# Patient Record
Sex: Female | Born: 1950
Health system: Southern US, Community
[De-identification: ages and names within clinical notes are randomized; demographics above are authoritative.]

## PROBLEM LIST (undated history)

## (undated) DIAGNOSIS — K219 Gastro-esophageal reflux disease without esophagitis: Secondary | ICD-10-CM

## (undated) DIAGNOSIS — E669 Obesity, unspecified: Secondary | ICD-10-CM

## (undated) DIAGNOSIS — E039 Hypothyroidism, unspecified: Secondary | ICD-10-CM

## (undated) DIAGNOSIS — T8859XA Other complications of anesthesia, initial encounter: Secondary | ICD-10-CM

## (undated) DIAGNOSIS — R42 Dizziness and giddiness: Secondary | ICD-10-CM

## (undated) DIAGNOSIS — M1990 Unspecified osteoarthritis, unspecified site: Secondary | ICD-10-CM

## (undated) DIAGNOSIS — I10 Essential (primary) hypertension: Secondary | ICD-10-CM

## (undated) DIAGNOSIS — I200 Unstable angina: Secondary | ICD-10-CM

## (undated) DIAGNOSIS — E66812 Obesity, class 2: Secondary | ICD-10-CM

## (undated) DIAGNOSIS — N1831 Chronic kidney disease, stage 3a: Secondary | ICD-10-CM

## (undated) DIAGNOSIS — I739 Peripheral vascular disease, unspecified: Secondary | ICD-10-CM

## (undated) DIAGNOSIS — T4145XA Adverse effect of unspecified anesthetic, initial encounter: Secondary | ICD-10-CM

## (undated) DIAGNOSIS — I2510 Atherosclerotic heart disease of native coronary artery without angina pectoris: Secondary | ICD-10-CM

## (undated) DIAGNOSIS — I011 Acute rheumatic endocarditis: Secondary | ICD-10-CM

## (undated) DIAGNOSIS — D649 Anemia, unspecified: Secondary | ICD-10-CM

## (undated) DIAGNOSIS — E785 Hyperlipidemia, unspecified: Secondary | ICD-10-CM

## (undated) DIAGNOSIS — M797 Fibromyalgia: Secondary | ICD-10-CM

## (undated) DIAGNOSIS — I341 Nonrheumatic mitral (valve) prolapse: Secondary | ICD-10-CM

## (undated) HISTORY — PX: ABDOMINAL HYSTERECTOMY: SHX81

## (undated) HISTORY — DX: Obesity, class 2: E66.812

## (undated) HISTORY — DX: Unstable angina: I20.0

## (undated) HISTORY — PX: WRIST ARTHROSCOPY: SHX838

## (undated) HISTORY — PX: WRIST ARTHROSCOPY: SUR100

## (undated) HISTORY — DX: Peripheral vascular disease, unspecified: I73.9

## (undated) HISTORY — PX: CHOLECYSTECTOMY: SHX55

## (undated) HISTORY — PX: JOINT REPLACEMENT: SHX530

## (undated) HISTORY — DX: Atherosclerotic heart disease of native coronary artery without angina pectoris: I25.10

## (undated) HISTORY — DX: Obesity, unspecified: E66.9

## (undated) HISTORY — PX: FOOT SURGERY: SHX648

## (undated) HISTORY — DX: Dizziness and giddiness: R42

## (undated) HISTORY — DX: Hyperlipidemia, unspecified: E78.5

## (undated) HISTORY — PX: COLONOSCOPY: SHX174

## (undated) HISTORY — DX: Chronic kidney disease, stage 3a: N18.31

## (undated) HISTORY — DX: Anemia, unspecified: D64.9

---

## 1998-03-13 ENCOUNTER — Other Ambulatory Visit: Admission: RE | Admit: 1998-03-13 | Discharge: 1998-03-13 | Payer: Self-pay | Admitting: Obstetrics & Gynecology

## 1999-04-02 ENCOUNTER — Other Ambulatory Visit: Admission: RE | Admit: 1999-04-02 | Discharge: 1999-04-02 | Payer: Self-pay | Admitting: Obstetrics and Gynecology

## 1999-04-03 ENCOUNTER — Other Ambulatory Visit: Admission: RE | Admit: 1999-04-03 | Discharge: 1999-04-03 | Payer: Self-pay | Admitting: Obstetrics and Gynecology

## 1999-04-03 ENCOUNTER — Encounter (INDEPENDENT_AMBULATORY_CARE_PROVIDER_SITE_OTHER): Payer: Self-pay

## 2000-01-15 ENCOUNTER — Ambulatory Visit (HOSPITAL_COMMUNITY): Admission: RE | Admit: 2000-01-15 | Discharge: 2000-01-15 | Payer: Self-pay | Admitting: *Deleted

## 2000-01-15 ENCOUNTER — Encounter (INDEPENDENT_AMBULATORY_CARE_PROVIDER_SITE_OTHER): Payer: Self-pay

## 2000-04-28 ENCOUNTER — Other Ambulatory Visit: Admission: RE | Admit: 2000-04-28 | Discharge: 2000-04-28 | Payer: Self-pay | Admitting: Obstetrics and Gynecology

## 2001-05-07 ENCOUNTER — Other Ambulatory Visit: Admission: RE | Admit: 2001-05-07 | Discharge: 2001-05-07 | Payer: Self-pay | Admitting: Obstetrics and Gynecology

## 2001-05-13 ENCOUNTER — Encounter: Admission: RE | Admit: 2001-05-13 | Discharge: 2001-05-13 | Payer: Self-pay | Admitting: Obstetrics and Gynecology

## 2001-05-13 ENCOUNTER — Encounter: Payer: Self-pay | Admitting: Obstetrics and Gynecology

## 2001-09-03 ENCOUNTER — Encounter: Payer: Self-pay | Admitting: General Surgery

## 2001-09-08 ENCOUNTER — Encounter: Payer: Self-pay | Admitting: General Surgery

## 2001-09-08 ENCOUNTER — Observation Stay (HOSPITAL_COMMUNITY): Admission: RE | Admit: 2001-09-08 | Discharge: 2001-09-09 | Payer: Self-pay | Admitting: General Surgery

## 2001-09-08 ENCOUNTER — Encounter (INDEPENDENT_AMBULATORY_CARE_PROVIDER_SITE_OTHER): Payer: Self-pay

## 2002-04-02 ENCOUNTER — Encounter: Payer: Self-pay | Admitting: Orthopedic Surgery

## 2002-04-02 ENCOUNTER — Ambulatory Visit (HOSPITAL_COMMUNITY): Admission: RE | Admit: 2002-04-02 | Discharge: 2002-04-02 | Payer: Self-pay | Admitting: Orthopedic Surgery

## 2002-06-07 ENCOUNTER — Other Ambulatory Visit: Admission: RE | Admit: 2002-06-07 | Discharge: 2002-06-07 | Payer: Self-pay | Admitting: Obstetrics and Gynecology

## 2002-07-02 ENCOUNTER — Emergency Department (HOSPITAL_COMMUNITY): Admission: EM | Admit: 2002-07-02 | Discharge: 2002-07-02 | Payer: Self-pay | Admitting: Emergency Medicine

## 2002-07-02 ENCOUNTER — Encounter: Payer: Self-pay | Admitting: Emergency Medicine

## 2003-07-07 ENCOUNTER — Other Ambulatory Visit: Admission: RE | Admit: 2003-07-07 | Discharge: 2003-07-07 | Payer: Self-pay | Admitting: Obstetrics and Gynecology

## 2004-01-27 ENCOUNTER — Ambulatory Visit (HOSPITAL_COMMUNITY): Admission: RE | Admit: 2004-01-27 | Discharge: 2004-01-27 | Payer: Self-pay | Admitting: *Deleted

## 2004-04-18 ENCOUNTER — Ambulatory Visit (HOSPITAL_COMMUNITY): Admission: RE | Admit: 2004-04-18 | Discharge: 2004-04-18 | Payer: Self-pay | Admitting: *Deleted

## 2004-12-03 ENCOUNTER — Other Ambulatory Visit: Admission: RE | Admit: 2004-12-03 | Discharge: 2004-12-03 | Payer: Self-pay | Admitting: Obstetrics and Gynecology

## 2006-03-13 ENCOUNTER — Ambulatory Visit: Payer: Self-pay | Admitting: Critical Care Medicine

## 2006-04-18 ENCOUNTER — Encounter: Admission: RE | Admit: 2006-04-18 | Discharge: 2006-04-18 | Payer: Self-pay | Admitting: Orthopedic Surgery

## 2006-05-13 ENCOUNTER — Encounter: Admission: RE | Admit: 2006-05-13 | Discharge: 2006-05-13 | Payer: Self-pay | Admitting: Orthopedic Surgery

## 2007-08-15 ENCOUNTER — Encounter: Admission: RE | Admit: 2007-08-15 | Discharge: 2007-08-15 | Payer: Self-pay | Admitting: Orthopedic Surgery

## 2008-11-14 ENCOUNTER — Encounter: Admission: RE | Admit: 2008-11-14 | Discharge: 2008-11-14 | Payer: Self-pay | Admitting: Orthopedic Surgery

## 2009-08-01 HISTORY — PX: KNEE ARTHROSCOPY: SHX127

## 2009-08-25 ENCOUNTER — Ambulatory Visit (HOSPITAL_BASED_OUTPATIENT_CLINIC_OR_DEPARTMENT_OTHER): Admission: RE | Admit: 2009-08-25 | Discharge: 2009-08-25 | Payer: Self-pay | Admitting: Orthopedic Surgery

## 2010-06-11 ENCOUNTER — Encounter
Admission: RE | Admit: 2010-06-11 | Discharge: 2010-06-11 | Payer: Self-pay | Source: Home / Self Care | Attending: Orthopedic Surgery | Admitting: Orthopedic Surgery

## 2010-07-22 ENCOUNTER — Encounter: Payer: Self-pay | Admitting: Orthopedic Surgery

## 2010-08-30 ENCOUNTER — Other Ambulatory Visit: Payer: Self-pay | Admitting: Orthopedic Surgery

## 2010-08-30 DIAGNOSIS — M545 Low back pain, unspecified: Secondary | ICD-10-CM

## 2010-09-02 ENCOUNTER — Ambulatory Visit
Admission: RE | Admit: 2010-09-02 | Discharge: 2010-09-02 | Disposition: A | Discharge status: Home / Self Care | Payer: BC Managed Care – HMO | Source: Ambulatory Visit | Attending: Orthopedic Surgery | Admitting: Orthopedic Surgery

## 2010-09-02 DIAGNOSIS — M545 Low back pain, unspecified: Secondary | ICD-10-CM

## 2010-09-19 LAB — BASIC METABOLIC PANEL
BUN: 9 mg/dL (ref 6–23)
CO2: 30 mEq/L (ref 19–32)
Calcium: 9.1 mg/dL (ref 8.4–10.5)
Chloride: 100 mEq/L (ref 96–112)
Creatinine, Ser: 0.76 mg/dL (ref 0.4–1.2)
GFR calc Af Amer: >60 mL/min (ref >60)
GFR calc non Af Amer: >60 mL/min (ref >60)
Glucose, Bld: 107 mg/dL — ABNORMAL HIGH (ref 70–99)
Potassium: 3.3 mEq/L — ABNORMAL LOW (ref 3.5–5.1)
Sodium: 138 mEq/L (ref 135–145)

## 2010-09-19 LAB — POCT HEMOGLOBIN-HEMACUE: Hemoglobin: 12.5 g/dL (ref 12.0–15.0)

## 2010-11-16 NOTE — Procedures (Signed)
[facility]  Patient:    Kari [name], Kari [name]                        MRN: [id number] Adm. Date:  [id number] Attending:  Sabino Gasser CC:         Miguel Aschoff, M.D.                           Procedure Report  PROCEDURE:  Upper endoscopy.  ENDOSCOPIST:  Sabino Gasser, M.D.  INDICATIONS:  Reflux symptomatology, chest pain.  ANESTHESIA:  Demerol 70 mg and Versed 8 mg were given intravenously in divided dose.  DESCRIPTION OF PROCEDURE:  With patient mildly sedated in the left lateral decubitus position, the Olympus videoscopic endoscope was inserted in the mouth and passed under direct vision through the esophagus, which appeared normal, into the stomach; fundus, body and antrum all appeared normal. Duodenal bulb and second portion of duodenum were well-visualized and they also appeared normal.  Photographs were taken of these areas.  From this point, the endoscope was slowly withdrawn, taking circumferential views of the entire duodenal mucosa, till the endoscope had been pulled back into the stomach and placed on retroflexion to view the stomach from below and this too appeared normal and was photographed.  The endoscope was straightened and pulled back from distal-to-proximal stomach, taking circumferential views of the entire gastric mucosa, which otherwise appeared normal, as did the esophageal mucosa.  The endoscope was withdrawn.  Patients vital signs and pulse oximetry remained stable.  Patient tolerated the procedure well without apparent complications.  FINDINGS:  Essentially negative endoscopic examination.  PLAN:  Proceed to colonoscopy. DD:  01/15/00 TD:  01/16/00 Job: [id number]

## 2010-11-16 NOTE — Op Note (Signed)
[facility]  Patient:    KUULEI, KLEIER Visit Number: [id number] MRN: [id number]          Service Type: DSU Location: DAY Attending Physician:  Carson Myrtle Dictated by:   Sheppard Plumber Earlene Plater, M.D. Proc. Date: 09/08/01 Admit Date:  09/08/2001   CC:         Chales Salmon. Abigail Miyamoto, M.D.  Sabino Gasser, M.D.   Operative Report  PREOPERATIVE DIAGNOSES:  Cholecystitis and biliary dyskinesia.  POSTOPERATIVE DIAGNOSES:  Cholecystectomy and biliary dyskinesia.  PROCEDURE:  Laparoscopic cholecystectomy and operative cholangiogram.  SURGEON:  Timothy E. Earlene Plater, M.D.  ASSISTANT:  Vikki Ports, M.D.  ANESTHESIA:  CRNA supervised Dr. Rica Mast.  INDICATIONS FOR PROCEDURE:  Ms. Tuohy had been seen and evaluated in the office. She has biliary dyskinesia with decreased ejection fraction of gallbladder function, no evident stones and a history compatible with cholecystitis. After careful discussion, evaluation, and options explained, she wishes to proceed with laparoscopic cholecystectomy. She has a number of medical conditions and allergies which have been evaluated and avoided.  DESCRIPTION OF PROCEDURE:  The patient was taken to the operating room, placed supine, general endotracheal anesthesia administered and was scrubbed prepped and draped in the usual fashion. An infraumbilical incision was made in the fascia and peritoneum entered without complication. The Hasson catheter placed, the abdomen insufflated. Peritoneoscopy revealed a distended bladder but no other abnormalities. A second 10 mm trocar placed in the mid epigastrium and two 5 mm trocars in the right upper quadrant. The gallbladder did appear thickened; however, there were no stones or adhesions. The gallbladder was grasped, placed on tension, carefully dissected and there was a small anterior artery. A more prominent posterior artery and a normal appearing cystic duct. These  structures were separately dissected out, the anterior artery triply clipped and divided, cystic duct clipped near the gallbladder, an incision made in the cystic duct and cholangiography carried out with real-time fluoroscopy. The dye flowed quickly and smoothly through the common bile duct into the duodenum and then filled the biliary tree. It appeared normal. The catheter was removed and the remnant of the cystic duct triply clipped. It was fully divided, the posterior artery quadruply clipped an divided and then the gallbladder dissected from the gallbladder bed with the placement of a couple of extra clips in the gallbladder bed. There was no bleeding, no bile spillage and no complications. The irrigant was clear. The gallbladder was removed through the infraumbilical incision was tied closed with a #1 Vicryl and was passed off the field. Again inspection and irrigation carried out and was clear. There were no apparent problems. Trocars removed under direct vision. The abdomen released of CO2. Counts correct. The skin incisions closed with subcuticular 3-0 monocryl. Steri-Strips applied. Final count is correct. She was removed to the recovery room in good condition. Dictated by:   Sheppard Plumber Earlene Plater, M.D. Attending Physician:  Carson Myrtle DD:  09/08/01 TD:  09/08/01 Job: [id number]

## 2010-11-16 NOTE — Procedures (Signed)
[facility]  Patient:    Kari [name], Kari [name]                        MRN: [id number] Adm. Date:  [id number] Attending:  Sabino Gasser CC:         Miguel Aschoff, M.D.                           Procedure Report  PROCEDURE:  Colonoscopy.  ENDOSCOPIST:  Sabino Gasser, M.D.  INDICATIONS:  Hemoccult positivity.  Family history of colon polyps and colon cancer.  ANESTHESIA:  Demerol 20 mg and Versed 2 mg were given intravenously in divided dose.  DESCRIPTION OF PROCEDURE:  With patient mildly sedated in the left lateral decubitus position, the Olympus videoscopic colonoscope was inserted in the rectum and passed through a tortuous sigmoid colon to the cecum.  Cecum was identified by ileocecal valve and appendiceal orifice, both of which were photographed; however, all photographs were lost.  We also then entered into the terminal ileum via the ileocecal valve and this too appeared normal.  From this point, the colonoscope was slowly withdrawn, taking circumferential views of the entire colonic mucosa, stopping to take random biopsies along the way of mucosa that looked somewhat villous, almost like small intestinal mucosa. This was done till we pulled back all the way to the rectum, which appeared normal on direct view and showed large internal hemorrhoids on retroflexed view.  The endoscope was straightened and withdrawn.  Patients vital signs and pulse oximetry remained stable.  Patient tolerated the procedure well without apparent complications.  FINDINGS:  Large internal hemorrhoids.  Villous-appearing mucosa biopsy, await biopsy report.  Otherwise unremarkable colonoscopic examination.  PLAN:  Have patient follow up with me for the results of the biopsy and as needed. DD:  01/15/00 TD:  01/16/00 Job: [id number]

## 2010-11-20 ENCOUNTER — Other Ambulatory Visit: Payer: Self-pay | Admitting: Orthopedic Surgery

## 2010-11-20 DIAGNOSIS — M25552 Pain in left hip: Secondary | ICD-10-CM

## 2010-11-21 ENCOUNTER — Ambulatory Visit
Admission: RE | Admit: 2010-11-21 | Discharge: 2010-11-21 | Disposition: A | Discharge status: Home / Self Care | Payer: BC Managed Care – HMO | Source: Ambulatory Visit | Attending: Orthopedic Surgery | Admitting: Orthopedic Surgery

## 2010-11-21 DIAGNOSIS — M25552 Pain in left hip: Secondary | ICD-10-CM

## 2011-02-26 ENCOUNTER — Other Ambulatory Visit: Payer: Self-pay | Admitting: Family Medicine

## 2011-02-26 DIAGNOSIS — G44009 Cluster headache syndrome, unspecified, not intractable: Secondary | ICD-10-CM

## 2011-02-26 DIAGNOSIS — T1490XA Injury, unspecified, initial encounter: Secondary | ICD-10-CM

## 2011-02-27 ENCOUNTER — Ambulatory Visit
Admission: RE | Admit: 2011-02-27 | Discharge: 2011-02-27 | Disposition: A | Discharge status: Home / Self Care | Payer: BC Managed Care – HMO | Source: Ambulatory Visit | Attending: Family Medicine | Admitting: Family Medicine

## 2011-02-27 DIAGNOSIS — G44009 Cluster headache syndrome, unspecified, not intractable: Secondary | ICD-10-CM

## 2011-02-27 DIAGNOSIS — T1490XA Injury, unspecified, initial encounter: Secondary | ICD-10-CM

## 2011-05-28 ENCOUNTER — Other Ambulatory Visit: Payer: Self-pay | Admitting: Dermatology

## 2011-09-10 ENCOUNTER — Other Ambulatory Visit: Payer: Self-pay | Admitting: Orthopedic Surgery

## 2011-09-12 ENCOUNTER — Encounter (HOSPITAL_BASED_OUTPATIENT_CLINIC_OR_DEPARTMENT_OTHER): Payer: Self-pay | Admitting: *Deleted

## 2011-09-12 ENCOUNTER — Other Ambulatory Visit: Payer: Self-pay

## 2011-09-12 ENCOUNTER — Encounter (HOSPITAL_BASED_OUTPATIENT_CLINIC_OR_DEPARTMENT_OTHER)
Admission: RE | Admit: 2011-09-12 | Discharge: 2011-09-12 | Disposition: A | Discharge status: Home / Self Care | Payer: BC Managed Care – HMO | Source: Ambulatory Visit | Attending: Orthopedic Surgery | Admitting: Orthopedic Surgery

## 2011-09-12 LAB — COMPREHENSIVE METABOLIC PANEL
ALT: 27 U/L (ref 0–35)
AST: 23 U/L (ref 0–37)
Albumin: 4 g/dL (ref 3.5–5.2)
Alkaline Phosphatase: 82 U/L (ref 39–117)
BUN: 13 mg/dL (ref 6–23)
CO2: 26 mEq/L (ref 19–32)
Calcium: 9.7 mg/dL (ref 8.4–10.5)
Chloride: 103 mEq/L (ref 96–112)
Creatinine, Ser: 0.65 mg/dL (ref 0.50–1.10)
GFR calc Af Amer: >90 mL/min (ref >90)
GFR calc non Af Amer: >90 mL/min (ref >90)
Glucose, Bld: 91 mg/dL (ref 70–99)
Potassium: 3.6 mEq/L (ref 3.5–5.1)
Sodium: 139 mEq/L (ref 135–145)
Total Bilirubin: 0.8 mg/dL (ref 0.3–1.2)
Total Protein: 6.9 g/dL (ref 6.0–8.3)

## 2011-09-12 NOTE — Progress Notes (Signed)
cmet done-has to watch liver studies due to methotrexate ekg Bring meds dos

## 2011-09-17 ENCOUNTER — Encounter (HOSPITAL_BASED_OUTPATIENT_CLINIC_OR_DEPARTMENT_OTHER)
Admission: RE | Disposition: A | Discharge status: Home / Self Care | Payer: Self-pay | Source: Ambulatory Visit | Attending: Orthopedic Surgery

## 2011-09-17 ENCOUNTER — Ambulatory Visit (HOSPITAL_BASED_OUTPATIENT_CLINIC_OR_DEPARTMENT_OTHER)
Admission: RE | Admit: 2011-09-17 | Discharge: 2011-09-17 | Disposition: A | Discharge status: Home / Self Care | Payer: BC Managed Care – HMO | Source: Ambulatory Visit | Attending: Orthopedic Surgery | Admitting: Orthopedic Surgery

## 2011-09-17 ENCOUNTER — Encounter (HOSPITAL_BASED_OUTPATIENT_CLINIC_OR_DEPARTMENT_OTHER): Payer: Self-pay | Admitting: Anesthesiology

## 2011-09-17 ENCOUNTER — Encounter (HOSPITAL_BASED_OUTPATIENT_CLINIC_OR_DEPARTMENT_OTHER): Payer: Self-pay | Admitting: *Deleted

## 2011-09-17 ENCOUNTER — Ambulatory Visit (HOSPITAL_BASED_OUTPATIENT_CLINIC_OR_DEPARTMENT_OTHER): Payer: BC Managed Care – HMO | Admitting: Anesthesiology

## 2011-09-17 DIAGNOSIS — IMO0001 Reserved for inherently not codable concepts without codable children: Secondary | ICD-10-CM | POA: Insufficient documentation

## 2011-09-17 DIAGNOSIS — M069 Rheumatoid arthritis, unspecified: Secondary | ICD-10-CM | POA: Insufficient documentation

## 2011-09-17 DIAGNOSIS — Z01810 Encounter for preprocedural cardiovascular examination: Secondary | ICD-10-CM | POA: Insufficient documentation

## 2011-09-17 DIAGNOSIS — I059 Rheumatic mitral valve disease, unspecified: Secondary | ICD-10-CM | POA: Insufficient documentation

## 2011-09-17 DIAGNOSIS — G5620 Lesion of ulnar nerve, unspecified upper limb: Secondary | ICD-10-CM | POA: Insufficient documentation

## 2011-09-17 DIAGNOSIS — I10 Essential (primary) hypertension: Secondary | ICD-10-CM | POA: Insufficient documentation

## 2011-09-17 DIAGNOSIS — E039 Hypothyroidism, unspecified: Secondary | ICD-10-CM | POA: Insufficient documentation

## 2011-09-17 DIAGNOSIS — K219 Gastro-esophageal reflux disease without esophagitis: Secondary | ICD-10-CM | POA: Insufficient documentation

## 2011-09-17 HISTORY — DX: Gastro-esophageal reflux disease without esophagitis: K21.9

## 2011-09-17 HISTORY — DX: Hypothyroidism, unspecified: E03.9

## 2011-09-17 HISTORY — DX: Acute rheumatic endocarditis: I01.1

## 2011-09-17 HISTORY — DX: Essential (primary) hypertension: I10

## 2011-09-17 HISTORY — PX: ULNAR NERVE TRANSPOSITION: SHX2595

## 2011-09-17 HISTORY — DX: Adverse effect of unspecified anesthetic, initial encounter: T41.45XA

## 2011-09-17 HISTORY — DX: Fibromyalgia: M79.7

## 2011-09-17 HISTORY — DX: Other complications of anesthesia, initial encounter: T88.59XA

## 2011-09-17 HISTORY — DX: Unspecified osteoarthritis, unspecified site: M19.90

## 2011-09-17 LAB — POCT HEMOGLOBIN-HEMACUE: Hemoglobin: 11.9 g/dL — ABNORMAL LOW (ref 12.0–15.0)

## 2011-09-17 SURGERY — ULNAR NERVE DECOMPRESSION/TRANSPOSITION
Anesthesia: General | Site: Elbow | Laterality: Left | Wound class: Clean

## 2011-09-17 MED ORDER — PROMETHAZINE HCL 25 MG/ML IJ SOLN: 6.2500 mg | INTRAMUSCULAR | Status: DC | PRN

## 2011-09-17 MED ORDER — HYDROMORPHONE HCL PF 1 MG/ML IJ SOLN
0.2500 mg | INTRAMUSCULAR | Status: DC | PRN
  Administered 2011-09-17 (×2): 0.5 mg via INTRAVENOUS

## 2011-09-17 MED ORDER — DEXAMETHASONE SODIUM PHOSPHATE 10 MG/ML IJ SOLN
INTRAMUSCULAR | Status: DC | PRN
  Administered 2011-09-17: 10 mg via INTRAVENOUS

## 2011-09-17 MED ORDER — LIDOCAINE HCL 2 % IJ SOLN
INTRAMUSCULAR | Status: DC | PRN
  Administered 2011-09-17: 3 mL

## 2011-09-17 MED ORDER — ONDANSETRON HCL 4 MG/2ML IJ SOLN
INTRAMUSCULAR | Status: DC | PRN
  Administered 2011-09-17: 4 mg via INTRAVENOUS

## 2011-09-17 MED ORDER — HYDROCODONE-ACETAMINOPHEN 5-325 MG PO TABS
1.0000 | ORAL_TABLET | Freq: Once | ORAL | Status: AC
  Administered 2011-09-17: 1 via ORAL

## 2011-09-17 MED ORDER — LACTATED RINGERS IV SOLN
INTRAVENOUS | Status: DC
  Administered 2011-09-17 (×2): via INTRAVENOUS

## 2011-09-17 MED ORDER — MIDAZOLAM HCL 5 MG/5ML IJ SOLN
INTRAMUSCULAR | Status: DC | PRN
  Administered 2011-09-17: 2 mg via INTRAVENOUS

## 2011-09-17 MED ORDER — PROPOFOL 10 MG/ML IV EMUL
INTRAVENOUS | Status: DC | PRN
  Administered 2011-09-17: 150 mg via INTRAVENOUS

## 2011-09-17 MED ORDER — FENTANYL CITRATE 0.05 MG/ML IJ SOLN
INTRAMUSCULAR | Status: DC | PRN
  Administered 2011-09-17 (×2): 25 ug via INTRAVENOUS
  Administered 2011-09-17: 100 ug via INTRAVENOUS

## 2011-09-17 MED ORDER — HYDROCODONE-ACETAMINOPHEN 5-325 MG PO TABS: ORAL_TABLET | ORAL | Status: AC

## 2011-09-17 SURGICAL SUPPLY — 43 items
BANDAGE ADHESIVE 1X3 (GAUZE/BANDAGES/DRESSINGS) IMPLANT
BANDAGE ELASTIC 4 VELCRO ST LF (GAUZE/BANDAGES/DRESSINGS) ×1 IMPLANT
BLADE MINI RND TIP GREEN BEAV (BLADE) ×1 IMPLANT
BLADE SURG 15 STRL LF DISP TIS (BLADE) ×1 IMPLANT
BLADE SURG 15 STRL SS (BLADE) ×2
BNDG CMPR 9X4 STRL LF SNTH (GAUZE/BANDAGES/DRESSINGS) ×1
BNDG ESMARK 4X9 LF (GAUZE/BANDAGES/DRESSINGS) ×1 IMPLANT
BRUSH SCRUB EZ PLAIN DRY (MISCELLANEOUS) ×2 IMPLANT
CLOTH BEACON ORANGE TIMEOUT ST (SAFETY) ×2 IMPLANT
CORDS BIPOLAR (ELECTRODE) ×2 IMPLANT
COVER MAYO STAND STRL (DRAPES) ×2 IMPLANT
COVER TABLE BACK 60X90 (DRAPES) ×2 IMPLANT
CUFF TOURNIQUET SINGLE 18IN (TOURNIQUET CUFF) ×1 IMPLANT
DECANTER SPIKE VIAL GLASS SM (MISCELLANEOUS) ×1 IMPLANT
DRAPE EXTREMITY T 121X128X90 (DRAPE) ×2 IMPLANT
DRAPE SURG 17X23 STRL (DRAPES) ×2 IMPLANT
DRSG TEGADERM 4X4.75 (GAUZE/BANDAGES/DRESSINGS) ×2 IMPLANT
GAUZE SPONGE 4X4 12PLY STRL LF (GAUZE/BANDAGES/DRESSINGS) ×1 IMPLANT
GLOVE BIO SURGEON STRL SZ 6.5 (GLOVE) ×2 IMPLANT
GLOVE BIOGEL M STRL SZ7.5 (GLOVE) ×2 IMPLANT
GLOVE EXAM NITRILE PF MED BLUE (GLOVE) ×1 IMPLANT
GLOVE ORTHO TXT STRL SZ7.5 (GLOVE) ×2 IMPLANT
GOWN BRE IMP PREV XXLGXLNG (GOWN DISPOSABLE) ×2 IMPLANT
GOWN PREVENTION PLUS XLARGE (GOWN DISPOSABLE) ×2 IMPLANT
LOOP VESSEL MAXI BLUE (MISCELLANEOUS) IMPLANT
NEEDLE 27GAX1X1/2 (NEEDLE) ×1 IMPLANT
PACK BASIN DAY SURGERY FS (CUSTOM PROCEDURE TRAY) ×2 IMPLANT
PADDING CAST ABS 4INX4YD NS (CAST SUPPLIES)
PADDING CAST ABS COTTON 4X4 ST (CAST SUPPLIES) ×1 IMPLANT
SLEEVE SCD COMPRESS KNEE MED (MISCELLANEOUS) ×1 IMPLANT
SPONGE GAUZE 4X4 12PLY (GAUZE/BANDAGES/DRESSINGS) ×1 IMPLANT
STOCKINETTE 4X48 STRL (DRAPES) ×2 IMPLANT
STRIP CLOSURE SKIN 1/2X4 (GAUZE/BANDAGES/DRESSINGS) ×2 IMPLANT
SUT PROLENE 3 0 PS 2 (SUTURE) ×2 IMPLANT
SUT VIC AB 3-0 X1 27 (SUTURE) ×1 IMPLANT
SUT VIC AB 4-0 P-3 18XBRD (SUTURE) IMPLANT
SUT VIC AB 4-0 P3 18 (SUTURE)
SYR 3ML 23GX1 SAFETY (SYRINGE) IMPLANT
SYR BULB 3OZ (MISCELLANEOUS) ×1 IMPLANT
SYR CONTROL 10ML LL (SYRINGE) ×1 IMPLANT
TOWEL OR 17X24 6PK STRL BLUE (TOWEL DISPOSABLE) ×4 IMPLANT
UNDERPAD 30X30 INCONTINENT (UNDERPADS AND DIAPERS) ×2 IMPLANT
WATER STERILE IRR 1000ML POUR (IV SOLUTION) ×2 IMPLANT

## 2011-09-17 NOTE — Progress Notes (Signed)
PT admitted to PACU Phase 1  At 0849 am.  Assessment completed at 0849 on arrival, error listed incorrectly on PACU flowsheet, please note assessment completed at 0849, not 0845.

## 2011-09-17 NOTE — Anesthesia Preprocedure Evaluation (Signed)
Anesthesia Evaluation  Patient identified by MRN, date of birth, ID band Patient awake    Reviewed: Allergy & Precautions, H&P , NPO status , reviewed documented beta blocker date and time   Airway Mallampati: II TM Distance: >3 FB Neck ROM: Full    Dental No notable dental hx. (+) Teeth Intact and Dental Advisory Given   Pulmonary neg pulmonary ROS,  breath sounds clear to auscultation  Pulmonary exam normal       Cardiovascular hypertension, Pt. on medications and Pt. on home beta blockers + Valvular Problems/Murmurs (? h/o MVP- not sx) MVP Rhythm:Regular Rate:Normal     Neuro/Psych negative neurological ROS     GI/Hepatic Neg liver ROS, GERD-  Medicated and Controlled,  Endo/Other  Hypothyroidism (on replacement) Morbid obesity  Renal/GU negative Renal ROS     Musculoskeletal  (+) Arthritis - (not on steroids presently), Rheumatoid disorders,  Fibromyalgia -  Abdominal (+) + obese,   Peds  Hematology   Anesthesia Other Findings   Reproductive/Obstetrics                           Anesthesia Physical Anesthesia Plan  ASA: III  Anesthesia Plan: General   Post-op Pain Management:    Induction: Intravenous  Airway Management Planned: LMA  Additional Equipment:   Intra-op Plan:   Post-operative Plan:   Informed Consent: I have reviewed the patients History and Physical, chart, labs and discussed the procedure including the risks, benefits and alternatives for the proposed anesthesia with the patient or authorized representative who has indicated his/her understanding and acceptance.   Dental advisory given  Plan Discussed with: Surgeon and CRNA  Anesthesia Plan Comments: (Plan routine monitors, GA- LMA OK)        Anesthesia Quick Evaluation

## 2011-09-17 NOTE — H&P (Signed)
Kari [name] is an 61 y.o. female.   Chief Complaint: Complaining of chronic and progressive numbness and tingling ulnar distribution left hand HPI: . She is left hand dominant.  She has significant pain in the region of her left elbow at the medial epicondyle. She had a series of steroid injections initially in the summer of 2012 and a 2nd in January 2013 without relief. With persistent numbness in her small finger, she was sent for electrodiagnostic studies. These were completed at [facility]. This revealed significant slowing of the left ulnar nerve across the cubital tunnel.   Past Medical History  Diagnosis Date  . Hypertension   . Arthritis   . Rheumatoid aortitis   . Osteoporosis   . GERD (gastroesophageal reflux disease)   . Hypothyroidism   . Fibromyalgia   . Complication of anesthesia     when block attempted ax -turned red in preop-surg delayed    Past Surgical History  Procedure Date  . Knee arthroscopy 2/11    right  . Wrist arthroplasty     rt x2-fusion  . Wrist arthroscopy     lt  . Cholecystectomy   . Abdominal hysterectomy   . Cesarean section   . Colonoscopy     History reviewed. No pertinent family history. Social History:  reports that she has never smoked. She does not have any smokeless tobacco history on file. She reports that she does not drink alcohol or use illicit drugs.  Allergies:  Allergies  Allergen Reactions  . Clinoril (Sulindac) Nausea And Vomiting  . Erythromycin Nausea And Vomiting  . Penicillins Hives  . Sulfa Antibiotics Nausea And Vomiting  . Trimethoprim Nausea And Vomiting  . Vancomycin Cross Reactors     Flushing-redmans reaction    Medications Prior to Admission  Medication Dose Route Frequency Provider Last Rate Last Dose  . lactated ringers infusion   Intravenous Continuous Hart Robinsons, MD 20 mL/hr at 09/17/11 4098     Medications Prior to Admission  Medication Sig Dispense Refill  . atenolol  (TENORMIN) 50 MG tablet Take 50 mg by mouth daily.      . cholecalciferol (VITAMIN D) 1000 UNITS tablet Take 1,000 Units by mouth daily.      . folic acid (FOLVITE) 800 MCG tablet Take 800 mcg by mouth daily.      Marland Kitchen gabapentin (NEURONTIN) 300 MG capsule Take 300 mg by mouth 2 (two) times daily before lunch and supper. Takes 2 am,3 pm      . hydrochlorothiazide (HYDRODIURIL) 25 MG tablet Take 25 mg by mouth daily.      Marland Kitchen levothyroxine (SYNTHROID, LEVOTHROID) 50 MCG tablet Take 50 mcg by mouth daily.      . methotrexate (RHEUMATREX) 2.5 MG tablet Take 7.5 mg by mouth once a week. Takes 8-2.5mg       . pantoprazole (PROTONIX) 40 MG tablet Take 40 mg by mouth daily.      . predniSONE (DELTASONE) 10 MG tablet Take 10 mg by mouth as needed.      . traMADol (ULTRAM) 50 MG tablet Take 50 mg by mouth every 6 (six) hours as needed.      . simvastatin (ZOCOR) 20 MG tablet Take 20 mg by mouth every evening.        Results for orders placed during the hospital encounter of 09/17/11 (from the past 48 hour(s))  POCT HEMOGLOBIN-HEMACUE     Status: Abnormal   Collection Time   09/17/11  6:55 AM  Component Value Range Comment   Hemoglobin 11.9 (*) 12.0 - 15.0 (g/dL)     No results found.   Pertinent items are noted in HPI.  Blood pressure 145/57, pulse 55, temperature 97.4 F (36.3 C), temperature source Oral, resp. rate 20, height 5\' 3"  (1.6 m), weight 80.74 kg (178 lb), SpO2 98.00%.  General appearance: alert Head: Normocephalic, without obvious abnormality Neck: supple, symmetrical, trachea midline Resp: clear to auscultation bilaterally Cardio: regular rate and rhythm, S1, S2 normal, no murmur, click, rub or gallop GI: normal findings: bowel sounds normal Extremities:no sign of intrinsic atrophy right or left. She has a positive elbow hyperflexion test on the left, positive Tinel's sign over the cubital tunnel. She has a stable ulnar nerve. Her pulses and capillary refill are intact. Her pinch  and grip strength are preserved  Electrodiagnostic studies from Dr. Maurice Small are reviewed. There is evidence of a demyelinating compressive mononeuropathy left ulnar nerve at the cubital tunnel.  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 The procedure risks, benefits, and postoperative course were discussed with the patient at length and she was in agreement with this plan.  DASNOIT,Jamarion [place] J 09/17/2011, 7:18 AM    H&P documentation: 09/17/2011  -History and Physical Reviewed  -Patient has been re-examined  -No change in the plan of care  Wyn Forster, MD

## 2011-09-17 NOTE — Anesthesia Procedure Notes (Signed)
Procedure Name: LMA Insertion Date/Time: 09/17/2011 8:03 AM Performed by: Burna Cash Pre-anesthesia Checklist: Patient identified, Emergency Drugs available, Suction available and Patient being monitored Patient Re-evaluated:Patient Re-evaluated prior to inductionOxygen Delivery Method: Circle System Utilized Preoxygenation: Pre-oxygenation with 100% oxygen Intubation Type: IV induction Ventilation: Mask ventilation without difficulty LMA: LMA inserted LMA Size: 4.0 Number of attempts: 1 Airway Equipment and Method: bite block Placement Confirmation: positive ETCO2 Tube secured with: Tape Dental Injury: Teeth and Oropharynx as per pre-operative assessment

## 2011-09-17 NOTE — Op Note (Signed)
OP NOTE DICTATED 09/17/11 [id number]

## 2011-09-17 NOTE — Op Note (Signed)
NAME:  Kari [name], Kari [name]                      ACCOUNT NO.:  MEDICAL RECORD NO.:  [id number]  LOCATION:                                 FACILITY:  PHYSICIAN:  Katy Fitch. Grainne Knights, M.D.      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  OPERATION:  In situ decompression of left ulnar nerve at cubital tunnel.  SURGEON:  Josephine Igo, MD  ASSISTANT:  Marveen Reeks Dasnoit, PAC  ANESTHESIA:  General by LMA.  SUPERVISING ANESTHESIOLOGIST:  Germaine Pomfret, MD  INDICATIONS:  Kari [name] is a 62 year old woman with a long-standing rheumatoid arthritis.  We have been involved in her care off and on for the past 20 years.  Recently, she was referred through the courtesy of Dr. Lunette Stands for evaluation and management of chronic left arm numbness in the ring and small fingers and weakness of pinch and grasp.  Dr. Charlett Blake sent her for detailed electrodiagnostic studies at [facility] by the physical medicine specialist who documented significant left ulnar neuropathy in the cubital tunnel and normal right ulnar nerve function.  We provided detailed anesthesia informed consent to Kari [name] recommending proceeding with in situ and/or transposition of the ulnar nerve depending on our findings of surgery and whether or not the nerve remained stable after decompression.  Preoperatively, questions were invited and answered in detail.  PROCEDURE:  Kari [name] was brought to room 2 of the [facility] and placed in supine position on the operating table.  Following routine anesthesia informed consent by Dr. Jean Rosenthal, general anesthesia by LMA technique was recommended and accepted.  In room 2 under Dr. Edison Pace direct supervision, general  anesthesia was induced, followed by routine Betadine scrub and paint of the left upper extremity.  Kari [name]' multiple drug allergies were noted.  We elected to proceed without prophylactic antibiotics.  Following routine Betadine scrub and paint, sterile stockinette and impervious arthroscopy drapes were applied, followed by exsanguination of the left arm with Esmarch bandage, inflation of arterial tourniquet on the proximal brachium to 220 mmHg.  Procedure commenced with a routine surgical time-out, followed by a 3-cm incision directly over the path of the ulnar nerve posterior to the medial epicondyle.  Subcutaneous tissues were carefully divided taking care to identify and gently spare the posterior branch of the medial antebrachial cutaneous nerve.  The nerve was unroofed posterior to the epicondyle preserving most of the arcuate ligament anteriorly.  The nerve was decompressed 6 cm proximally and at least 8 cm distally by release of the fascia of the flexor carpi ulnaris.  Dissection of multiple fibrous bands and transverse vessels that were compressing the nerve deep to the heads of the flexor carpi ulnaris.  After complete decompression, hemostasis was achieved with bipolar cautery and the saline, followed by examination of the elbow both with the tourniquet on and off.  The elbow was ranged 0- 140 degrees elbow flexion.  The nerve remained  stable behind the medial epicondyle and the remnant of the arcuate ligament.  There was a bulging medial head of the triceps that was removed with electrocautery removing approximately 1 cm wide strip of the medial head of the triceps over distance of 2 cm.  This appeared to decrease the roll of the nerve from the cubital groove with full elbow flexion.  The wound was then irrigated, followed by closure with subcutaneous 3-0 Vicryl and intradermal 3-0 Prolene with Steri-Strips.  Sterile dressing was applied with gauze and Tegaderm,  followed by Ace wrap.  For aftercare, Kari [name] was provided with a prescription for hydrocodone 5/325 one p.o. q.4-6 hours p.r.n. pain, 24 tablets without refill.  We will see her back for followup in our office in 8 days for dressing change and a range of motion program.  We have encouraged her to begin immediate range of motion exercises beginning this afternoon.     Katy Fitch Makya Yurko, M.D.     RVS/MEDQ  D:  09/17/2011  T:  09/17/2011  Job:  [id number]  cc:   Lunette Stands, M.D.

## 2011-09-17 NOTE — Transfer of Care (Signed)
Immediate Anesthesia Transfer of Care Note  Patient: Kari [name]  Procedure(s) Performed: Procedure(s) (LRB): ULNAR NERVE DECOMPRESSION/TRANSPOSITION (Left)  Patient Location: PACU  Anesthesia Type: General  Level of Consciousness: awake  Airway & Oxygen Therapy: Patient Spontanous Breathing and Patient connected to face mask oxygen  Post-op Assessment: Report given to PACU RN and Post -op Vital signs reviewed and stable  Post vital signs: Reviewed and stable  Complications: No apparent anesthesia complications

## 2011-09-17 NOTE — Brief Op Note (Signed)
09/17/2011  8:44 AM  PATIENT:  Kari [name]  62 y.o. female  PRE-OPERATIVE DIAGNOSIS:  left ulnar nerve neuropathy cubital tunnel  POST-OPERATIVE DIAGNOSIS:  left ulnar nerve neuropathy cubital tunnel  PROCEDURE:  Procedure(s) (LRB): ULNAR NERVE DECOMPRESSION LEFT ELBOW  SURGEON: Wyn Forster., MD   PHYSICIAN ASSISTANT:   ASSISTANTS: Mallory Shirk.A-C   ANESTHESIA:   general  EBL:  Total I/O In: 1000 [I.V.:1000] Out: -   BLOOD ADMINISTERED:none  DRAINS: none   LOCAL MEDICATIONS USED:  LIDOCAINE 3CC 2%  SPECIMEN:  No Specimen  DISPOSITION OF SPECIMEN:  N/A  COUNTS:  YES  TOURNIQUET:   Total Tourniquet Time Documented: Upper Arm (Left) - 19 minutes  DICTATION: .Other Dictation: Dictation Number [id number]  PLAN OF CARE: Discharge to home after PACU  PATIENT DISPOSITION:  PACU - hemodynamically stable.

## 2011-09-17 NOTE — Anesthesia Postprocedure Evaluation (Signed)
  Anesthesia Post-op Note  Patient: Kari [name]  Procedure(s) Performed: Procedure(s) (LRB): ULNAR NERVE DECOMPRESSION/TRANSPOSITION (Left)  Patient Location: PACU  Anesthesia Type: General  Level of Consciousness: awake, alert  and oriented  Airway and Oxygen Therapy: Patient Spontanous Breathing  Post-op Pain: none  Post-op Assessment: Post-op Vital signs reviewed, Patient's Cardiovascular Status Stable, Respiratory Function Stable, Patent Airway, No signs of Nausea or vomiting and Pain level controlled  Post-op Vital Signs: Reviewed and stable  Complications: No apparent anesthesia complications

## 2011-09-17 NOTE — Discharge Instructions (Signed)
[facility] Center Instructions °Hand Surgery ° °Wound Care: °Keep your hand elevated above the level of your heart.  Do not allow it to dangle  by your side.  Keep the dressing dry and do not remove it unless your doctor advises you to do so.  He will usually change it at the time of your post-op visit.  Moving your fingers is advised to stimulate circulation but will depend on the site of your surgery.  If you have a splint applied, your doctor will advise you regarding movement. ° °Activity: °Do not drive or operate machinery today.  Rest today and then you may return to your normal activity and work as indicated by your physician. ° °Diet:  °Drink liquids today or eat a light diet.  You may resume a regular diet tomorrow.   ° °General expectations: °Pain for two to three days. °Fingers may become slightly swollen. ° °Call your doctor if any of the following occur: °Severe pain not relieved by pain medication. °Elevated temperature. °Dressing soaked with blood. °Inability to move fingers. °White or bluish color to fingers.[facility]  °[address] °[place], [place] [address] °[phone] ° ° ° ° ° ° °Post Anesthesia Home Care Instructions ° °Activity: °Get plenty of rest for the remainder of the day. A responsible adult should stay with you for 24 hours following the procedure.  °For the next 24 hours, DO NOT: °-Drive a car °-Operate machinery °-Drink alcoholic beverages °-Take any medication unless instructed by your physician °-Make any legal decisions or sign important papers. ° °Meals: °Start with liquid foods such as gelatin or soup. Progress to regular foods as tolerated. Avoid greasy, spicy, heavy foods. If nausea and/or vomiting occur, drink only clear liquids until the nausea and/or vomiting subsides. Call your physician if vomiting continues. ° °Special Instructions/Symptoms: °Your throat may feel dry or sore from the anesthesia or the breathing tube placed in your throat during  surgery. If this causes discomfort, gargle with warm salt water. The discomfort should disappear within 24 hours. ° ° ° ° ° ° Call your surgeon if you experience:  ° °1.  Fever over 101.0. °2.  Inability to urinate. °3.  Nausea and/or vomiting. °4.  Extreme swelling or bruising at the surgical site. °5.  Continued bleeding from the incision. °6.  Increased pain, redness or drainage from the incision. °7.  Problems related to your pain medication.  °

## 2011-09-19 ENCOUNTER — Encounter (HOSPITAL_BASED_OUTPATIENT_CLINIC_OR_DEPARTMENT_OTHER): Payer: Self-pay | Admitting: Orthopedic Surgery

## 2011-10-24 ENCOUNTER — Other Ambulatory Visit: Payer: Self-pay | Admitting: Obstetrics and Gynecology

## 2012-01-21 ENCOUNTER — Other Ambulatory Visit: Payer: Self-pay | Admitting: Dermatology

## 2012-07-22 ENCOUNTER — Other Ambulatory Visit: Payer: Self-pay | Admitting: Orthopedic Surgery

## 2012-07-22 DIAGNOSIS — M25551 Pain in right hip: Secondary | ICD-10-CM

## 2012-07-22 DIAGNOSIS — M25561 Pain in right knee: Secondary | ICD-10-CM

## 2012-07-29 ENCOUNTER — Ambulatory Visit
Admission: RE | Admit: 2012-07-29 | Discharge: 2012-07-29 | Disposition: A | Discharge status: Home / Self Care | Payer: BC Managed Care – HMO | Source: Ambulatory Visit | Attending: Orthopedic Surgery | Admitting: Orthopedic Surgery

## 2012-07-29 DIAGNOSIS — M25551 Pain in right hip: Secondary | ICD-10-CM

## 2012-07-29 DIAGNOSIS — M25561 Pain in right knee: Secondary | ICD-10-CM

## 2012-07-29 MED ORDER — IOHEXOL 180 MG/ML  SOLN
10.0000 mL | Freq: Once | INTRAMUSCULAR | Status: AC | PRN
  Administered 2012-07-29: 10 mL via INTRA_ARTICULAR

## 2012-09-21 ENCOUNTER — Other Ambulatory Visit: Payer: Self-pay | Admitting: Orthopedic Surgery

## 2012-09-21 DIAGNOSIS — M169 Osteoarthritis of hip, unspecified: Secondary | ICD-10-CM

## 2012-09-22 ENCOUNTER — Ambulatory Visit
Admission: RE | Admit: 2012-09-22 | Discharge: 2012-09-22 | Disposition: A | Discharge status: Home / Self Care | Payer: BC Managed Care – PPO | Source: Ambulatory Visit | Attending: Orthopedic Surgery | Admitting: Orthopedic Surgery

## 2012-09-22 DIAGNOSIS — M169 Osteoarthritis of hip, unspecified: Secondary | ICD-10-CM

## 2012-09-22 MED ORDER — METHYLPREDNISOLONE ACETATE 40 MG/ML INJ SUSP (RADIOLOG
120.0000 mg | Freq: Once | INTRAMUSCULAR | Status: AC
  Administered 2012-09-22: 120 mg via INTRA_ARTICULAR

## 2012-09-22 MED ORDER — IOHEXOL 180 MG/ML  SOLN
1.0000 mL | Freq: Once | INTRAMUSCULAR | Status: AC | PRN
  Administered 2012-09-22: 1 mL via INTRA_ARTICULAR

## 2012-10-07 ENCOUNTER — Other Ambulatory Visit: Payer: Self-pay | Admitting: Orthopedic Surgery

## 2012-10-07 DIAGNOSIS — M25552 Pain in left hip: Secondary | ICD-10-CM

## 2012-10-15 ENCOUNTER — Ambulatory Visit
Admission: RE | Admit: 2012-10-15 | Discharge: 2012-10-15 | Disposition: A | Discharge status: Home / Self Care | Payer: BC Managed Care – PPO | Source: Ambulatory Visit | Attending: Orthopedic Surgery | Admitting: Orthopedic Surgery

## 2012-10-15 DIAGNOSIS — M25552 Pain in left hip: Secondary | ICD-10-CM

## 2012-10-15 MED ORDER — IOHEXOL 180 MG/ML  SOLN
13.0000 mL | Freq: Once | INTRAMUSCULAR | Status: AC | PRN
  Administered 2012-10-15: 13 mL via INTRA_ARTICULAR

## 2013-01-19 ENCOUNTER — Encounter (HOSPITAL_COMMUNITY): Payer: Self-pay | Admitting: Pharmacy Technician

## 2013-01-22 ENCOUNTER — Other Ambulatory Visit: Payer: Self-pay | Admitting: Orthopedic Surgery

## 2013-01-25 NOTE — Pre-Procedure Instructions (Addendum)
HENSLEY AZIZ  01/25/2013   Your procedure is scheduled on: Wednesday, February 03, 2013  Report to [facility] at 10:45 AM.  Call this number if you have problems the morning of surgery: [phone]   Remember:   Do not eat food or drink liquids after midnight.   Take these medicines the morning of surgery with A SIP OF WATER: atenolol (TENORMIN) 50 MG tablet, gabapentin (NEURONTIN) 300 MG capsule, levothyroxine (SYNTHROID, LEVOTHROID) 50 MCG tablet, pantoprazole (PROTONIX) 40 MG tablet , fluticasone (FLONASE) 50 MCG/ACT nasal spray: If needed: HYDROcodone-acetaminophen (NORCO/VICODIN) 5-325 MG per tablet for pain Stop taking Aspirin and herbal medicationsOmega-3 Fatty Acids (FISH OIL PO)  Do not take any NSAIDs ie: Ibuprofen, Advil, Naproxen or any medication containing Aspirin.  Do not wear jewelry, make-up or nail polish.  Do not wear lotions, powders, or perfumes. You may wear deodorant.  Do not shave 48 hours prior to surgery.   Do not bring valuables to the hospital.  [facility] is not responsible for any belongings or valuables.  Contacts, dentures or bridgework may not be worn into surgery.  Leave suitcase in the car. After surgery it may be brought to your room.  For patients admitted to the hospital, checkout time is 11:00 AM the day of discharge.   Patients discharged the day of surgery will not be allowed to drive home.  Name and phone number of your driver:   Special Instructions: Shower using CHG 2 nights before surgery and the night before surgery.  If you shower the day of surgery use CHG.  Use special wash - you have one bottle of CHG for all showers.  You should use approximately 1/3 of the bottle for each shower.   Please read over the following fact sheets that you were given: Pain Booklet, Coughing and Deep Breathing, Blood Transfusion Information, Total Joint Packet, MRSA Information and Surgical Site Infection Prevention

## 2013-01-26 ENCOUNTER — Encounter (HOSPITAL_COMMUNITY): Payer: Self-pay

## 2013-01-26 ENCOUNTER — Encounter (HOSPITAL_COMMUNITY)
Admission: RE | Admit: 2013-01-26 | Discharge: 2013-01-26 | Disposition: A | Discharge status: Home / Self Care | Payer: BC Managed Care – PPO | Source: Ambulatory Visit | Attending: Orthopedic Surgery | Admitting: Orthopedic Surgery

## 2013-01-26 ENCOUNTER — Ambulatory Visit (HOSPITAL_COMMUNITY)
Admission: RE | Admit: 2013-01-26 | Discharge: 2013-01-26 | Disposition: A | Discharge status: Home / Self Care | Payer: BC Managed Care – PPO | Source: Ambulatory Visit | Attending: Orthopedic Surgery | Admitting: Orthopedic Surgery

## 2013-01-26 DIAGNOSIS — Z01812 Encounter for preprocedural laboratory examination: Secondary | ICD-10-CM | POA: Insufficient documentation

## 2013-01-26 DIAGNOSIS — Z01818 Encounter for other preprocedural examination: Secondary | ICD-10-CM | POA: Insufficient documentation

## 2013-01-26 DIAGNOSIS — Z0183 Encounter for blood typing: Secondary | ICD-10-CM | POA: Insufficient documentation

## 2013-01-26 HISTORY — DX: Nonrheumatic mitral (valve) prolapse: I34.1

## 2013-01-26 LAB — TYPE AND SCREEN
ABO/RH(D): O POS
Antibody Screen: NEGATIVE

## 2013-01-26 LAB — ABO/RH: ABO/RH(D): O POS

## 2013-01-26 LAB — BASIC METABOLIC PANEL
BUN: 12 mg/dL (ref 6–23)
CO2: 25 mEq/L (ref 19–32)
Calcium: 9.7 mg/dL (ref 8.4–10.5)
Chloride: 102 mEq/L (ref 96–112)
Creatinine, Ser: 0.74 mg/dL (ref 0.50–1.10)
GFR calc Af Amer: >90 mL/min (ref >90)
GFR calc non Af Amer: 89 mL/min — ABNORMAL LOW (ref >90)
Glucose, Bld: 96 mg/dL (ref 70–99)
Potassium: 3.7 mEq/L (ref 3.5–5.1)
Sodium: 138 mEq/L (ref 135–145)

## 2013-01-26 LAB — SURGICAL PCR SCREEN
MRSA, PCR: NEGATIVE
Staphylococcus aureus: NEGATIVE

## 2013-01-26 LAB — CBC WITH DIFFERENTIAL/PLATELET
Basophils Absolute: 0.1 10*3/uL (ref 0.0–0.1)
Basophils Relative: 1 % (ref 0–1)
Eosinophils Absolute: 0.2 10*3/uL (ref 0.0–0.7)
Eosinophils Relative: 3 % (ref 0–5)
HCT: 38 % (ref 36.0–46.0)
Hemoglobin: 13.6 g/dL (ref 12.0–15.0)
Lymphocytes Relative: 25 % (ref 12–46)
Lymphs Abs: 1.5 10*3/uL (ref 0.7–4.0)
MCH: 33.5 pg (ref 26.0–34.0)
MCHC: 35.8 g/dL (ref 30.0–36.0)
MCV: 93.6 fL (ref 78.0–100.0)
Monocytes Absolute: 0.6 10*3/uL (ref 0.1–1.0)
Monocytes Relative: 9 % (ref 3–12)
Neutro Abs: 3.9 10*3/uL (ref 1.7–7.7)
Neutrophils Relative %: 62 % (ref 43–77)
Platelets: 234 10*3/uL (ref 150–400)
RBC: 4.06 MIL/uL (ref 3.87–5.11)
RDW: 13 % (ref 11.5–15.5)
WBC: 6.2 10*3/uL (ref 4.0–10.5)

## 2013-01-26 LAB — PROTIME-INR
INR: 0.96 (ref 0.00–1.49)
Prothrombin Time: 12.6 seconds (ref 11.6–15.2)

## 2013-01-26 LAB — URINALYSIS, ROUTINE W REFLEX MICROSCOPIC
Bilirubin Urine: NEGATIVE
Glucose, UA: NEGATIVE mg/dL
Hgb urine dipstick: NEGATIVE
Ketones, ur: NEGATIVE mg/dL
Nitrite: NEGATIVE
Protein, ur: NEGATIVE mg/dL
Specific Gravity, Urine: 1.017 (ref 1.005–1.030)
Urobilinogen, UA: 1 mg/dL (ref 0.0–1.0)
pH: 6.5 (ref 5.0–8.0)

## 2013-01-26 LAB — APTT: aPTT: 33 seconds (ref 24–37)

## 2013-01-26 LAB — URINE MICROSCOPIC-ADD ON

## 2013-01-26 NOTE — Progress Notes (Signed)
req'd ekg from pcp dr Jonnie Finner at [facility], and stress, echo from [facility] cardiology dr Anne Fu

## 2013-02-01 NOTE — H&P (Signed)
Kari [name] is an 62 y.o. female.   Chief Complaint: Severe Left Hip Pain in a patient with a long history of Rheumatoid arthritis  HPI: Patient is seen in consultation from Dr. Lunette Stands for evaluation of severe unremitting left hip pain.  She is a long-term rheumatoid arthritis patient.  Dr. Corliss Skains is her rheumatologist.  For the last 6 months she's had progressive left greater than right hip pain with x-rays that showed medial pole arthritis, although there is still a couple of millimeters of remaining cartilage.  She is gotten good relief from intra-articular cortisone injections in the past, although in the last month or so they only last approximately one week.  She was to be evaluated by Dr. Caswell Corwin at [facility] regarding possible hip arthroscopy, but at age 68, especially with the history of rheumatoid arthritis, which is essentially auto immune disease.  She was not seen.  Having failed conservative measures including anti-inflammatory medicines, physical therapy aquatic therapy and judicious use of narcotics, as well as intra-articular injection she is here for consideration of left hip replacement.  Her pain is severe it wakes her up every night, interferes with her ability to do chores, and even water aerobics.    Past Medical History  Diagnosis Date  . Hypertension   . Arthritis   . Osteoporosis   . GERD (gastroesophageal reflux disease)   . Hypothyroidism   . Fibromyalgia   . Complication of anesthesia     when block attempted ax -turned red in preop-surg delayed  . MVP (mitral valve prolapse)   . Rheumatoid aortitis     ? arthritis    Past Surgical History  Procedure Laterality Date  . Knee arthroscopy  2/11    right  . Wrist arthroplasty      rt x2-fusion   arthroscopy not athroplasty  . Wrist arthroscopy      lt  . Cholecystectomy    . Abdominal hysterectomy    . Cesarean section    . Colonoscopy    . Ulnar nerve transposition  09/17/2011    Procedure: ULNAR NERVE  DECOMPRESSION/TRANSPOSITION;  Surgeon: Wyn Forster., MD;  Location: [facility];  Service: Orthopedics;  Laterality: Left;  decompression    No family history on file. Social History:  reports that she has never smoked. She does not have any smokeless tobacco history on file. She reports that she does not drink alcohol or use illicit drugs.  Allergies:  Allergies  Allergen Reactions  . Clinoril (Sulindac) Nausea And Vomiting  . Erythromycin Nausea And Vomiting  . Hydrocodone Nausea Only    "in high doses stomach upset" ok with low doses.   . Penicillins Hives  . Sulfa Antibiotics Nausea And Vomiting  . Trimethoprim Nausea And Vomiting  . Vancomycin Cross Reactors     Flushing-redmans reaction    No prescriptions prior to admission    No results found for this or any previous visit (from the past 48 hour(s)). No results found.  Review of Systems  Constitutional: Positive for malaise/fatigue.  HENT: Negative.   Eyes: Negative.   Respiratory: Negative.   Cardiovascular: Negative.   Gastrointestinal: Negative.   Genitourinary: Negative.   Musculoskeletal: Positive for joint pain.  Skin: Negative.   Neurological: Negative.   Endo/Heme/Allergies: Negative.   Psychiatric/Behavioral: Negative.     There were no vitals taken for this visit. Physical Exam  Constitutional: She is oriented to person, place, and time. She appears well-developed and well-nourished.  HENT:  Head: Normocephalic and atraumatic.  Neck: Normal range of motion. Neck supple.  Cardiovascular: Intact distal pulses.   Respiratory: Effort normal and breath sounds normal.  Musculoskeletal: She exhibits tenderness.  Neurological: She is alert and oriented to person, place, and time. She has normal reflexes.  Skin: Skin is warm and dry.  Psychiatric: She has a normal mood and affect. Her behavior is normal. Judgment and thought content normal.     Assessment/Plan Assess: Long history  of rheumatoid arthritis now with severe unremitting left greater than right hip pain that is failed conservative treatment over the last 6 months.  Plan: Options were discussed at length with the patient.  We'll get her set up for left total hip arthroplasty using DePuy Pinnacle cup polyethylene liner SROM stem and metal head.  The risks benefits of surgery discussed at length because she is a rheumatoid.  Her wrists of surgery are increased over the general population she is aware of that.  Having said that, she is at her wits end because of pain and desires a level of relief that the cortisone injections into the hip gave her when they were working.  We will get instructions from Dr. Corliss Skains regarding when to discontinue the [facility] and methotrexate preoperatively.  PHILLIPS, ERIC R 02/01/2013, 1:38 PM

## 2013-02-02 MED ORDER — CLINDAMYCIN PHOSPHATE 900 MG/50ML IV SOLN
900.0000 mg | INTRAVENOUS | Status: AC
  Administered 2013-02-03: 900 mg via INTRAVENOUS
  Filled 2013-02-02: qty 50

## 2013-02-02 MED ORDER — CHLORHEXIDINE GLUCONATE 4 % EX LIQD: 60.0000 mL | Freq: Once | CUTANEOUS | Status: DC

## 2013-02-03 ENCOUNTER — Inpatient Hospital Stay (HOSPITAL_COMMUNITY)
Admission: RE | Admit: 2013-02-03 | Discharge: 2013-02-05 | DRG: 818 | Disposition: A | Discharge status: Home Health | Payer: BC Managed Care – PPO | Source: Ambulatory Visit | Attending: Orthopedic Surgery | Admitting: Orthopedic Surgery

## 2013-02-03 ENCOUNTER — Encounter (HOSPITAL_COMMUNITY): Payer: Self-pay | Admitting: Anesthesiology

## 2013-02-03 ENCOUNTER — Inpatient Hospital Stay (HOSPITAL_COMMUNITY): Payer: BC Managed Care – PPO

## 2013-02-03 ENCOUNTER — Encounter (HOSPITAL_COMMUNITY)
Admission: RE | Disposition: A | Discharge status: Home Health | Payer: Self-pay | Source: Ambulatory Visit | Attending: Orthopedic Surgery

## 2013-02-03 ENCOUNTER — Ambulatory Visit (HOSPITAL_COMMUNITY): Payer: BC Managed Care – PPO | Admitting: Anesthesiology

## 2013-02-03 DIAGNOSIS — E039 Hypothyroidism, unspecified: Secondary | ICD-10-CM | POA: Diagnosis present

## 2013-02-03 DIAGNOSIS — IMO0001 Reserved for inherently not codable concepts without codable children: Secondary | ICD-10-CM | POA: Diagnosis present

## 2013-02-03 DIAGNOSIS — Z880 Allergy status to penicillin: Secondary | ICD-10-CM

## 2013-02-03 DIAGNOSIS — Z79899 Other long term (current) drug therapy: Secondary | ICD-10-CM

## 2013-02-03 DIAGNOSIS — Z7982 Long term (current) use of aspirin: Secondary | ICD-10-CM

## 2013-02-03 DIAGNOSIS — M169 Osteoarthritis of hip, unspecified: Secondary | ICD-10-CM | POA: Diagnosis not present

## 2013-02-03 DIAGNOSIS — M1610 Unilateral primary osteoarthritis, unspecified hip: Secondary | ICD-10-CM | POA: Diagnosis present

## 2013-02-03 DIAGNOSIS — Z9089 Acquired absence of other organs: Secondary | ICD-10-CM

## 2013-02-03 DIAGNOSIS — M069 Rheumatoid arthritis, unspecified: Secondary | ICD-10-CM | POA: Diagnosis not present

## 2013-02-03 DIAGNOSIS — M810 Age-related osteoporosis without current pathological fracture: Secondary | ICD-10-CM | POA: Diagnosis present

## 2013-02-03 DIAGNOSIS — I10 Essential (primary) hypertension: Secondary | ICD-10-CM | POA: Diagnosis present

## 2013-02-03 DIAGNOSIS — Z881 Allergy status to other antibiotic agents status: Secondary | ICD-10-CM

## 2013-02-03 DIAGNOSIS — Z888 Allergy status to other drugs, medicaments and biological substances status: Secondary | ICD-10-CM

## 2013-02-03 DIAGNOSIS — Z882 Allergy status to sulfonamides status: Secondary | ICD-10-CM | POA: Diagnosis not present

## 2013-02-03 DIAGNOSIS — K219 Gastro-esophageal reflux disease without esophagitis: Secondary | ICD-10-CM | POA: Diagnosis present

## 2013-02-03 DIAGNOSIS — M25559 Pain in unspecified hip: Secondary | ICD-10-CM | POA: Diagnosis not present

## 2013-02-03 DIAGNOSIS — Q6589 Other specified congenital deformities of hip: Secondary | ICD-10-CM

## 2013-02-03 HISTORY — PX: TOTAL HIP ARTHROPLASTY: SHX124

## 2013-02-03 SURGERY — ARTHROPLASTY, HIP, TOTAL,POSTERIOR APPROACH
Anesthesia: Choice | Site: Hip | Laterality: Left | Wound class: Clean

## 2013-02-03 MED ORDER — NEOSTIGMINE METHYLSULFATE 1 MG/ML IJ SOLN
INTRAMUSCULAR | Status: DC | PRN
  Administered 2013-02-03: 4 mg via INTRAVENOUS

## 2013-02-03 MED ORDER — ACETAMINOPHEN 325 MG PO TABS
650.0000 mg | ORAL_TABLET | Freq: Four times a day (QID) | ORAL | Status: DC | PRN
  Administered 2013-02-04: 650 mg via ORAL
  Filled 2013-02-03: qty 2

## 2013-02-03 MED ORDER — EPHEDRINE SULFATE 50 MG/ML IJ SOLN
INTRAMUSCULAR | Status: DC | PRN
  Administered 2013-02-03: 15 mg via INTRAVENOUS
  Administered 2013-02-03: 10 mg via INTRAVENOUS
  Administered 2013-02-03: 5 mg via INTRAVENOUS
  Administered 2013-02-03: 10 mg via INTRAVENOUS

## 2013-02-03 MED ORDER — LIDOCAINE HCL (CARDIAC) 20 MG/ML IV SOLN
INTRAVENOUS | Status: DC | PRN
  Administered 2013-02-03: 60 mg via INTRAVENOUS

## 2013-02-03 MED ORDER — OXYCODONE HCL 5 MG PO TABS
ORAL_TABLET | ORAL | Status: AC
  Filled 2013-02-03: qty 2

## 2013-02-03 MED ORDER — METHOCARBAMOL 500 MG PO TABS
500.0000 mg | ORAL_TABLET | Freq: Four times a day (QID) | ORAL | Status: DC | PRN
  Administered 2013-02-03 – 2013-02-04 (×2): 500 mg via ORAL
  Filled 2013-02-03 (×2): qty 1

## 2013-02-03 MED ORDER — BUPIVACAINE-EPINEPHRINE PF 0.25-1:200000 % IJ SOLN
INTRAMUSCULAR | Status: AC
  Filled 2013-02-03: qty 30

## 2013-02-03 MED ORDER — KCL IN DEXTROSE-NACL 20-5-0.45 MEQ/L-%-% IV SOLN
INTRAVENOUS | Status: DC
  Administered 2013-02-04: 03:00:00 via INTRAVENOUS
  Filled 2013-02-03 (×5): qty 1000

## 2013-02-03 MED ORDER — DOCUSATE SODIUM 100 MG PO CAPS
100.0000 mg | ORAL_CAPSULE | Freq: Two times a day (BID) | ORAL | Status: DC
  Administered 2013-02-03 – 2013-02-05 (×4): 100 mg via ORAL
  Filled 2013-02-03 (×4): qty 1

## 2013-02-03 MED ORDER — TRANEXAMIC ACID 100 MG/ML IV SOLN
1000.0000 mg | INTRAVENOUS | Status: AC
  Administered 2013-02-03: 1000 mg via INTRAVENOUS
  Filled 2013-02-03: qty 10

## 2013-02-03 MED ORDER — METHOCARBAMOL 500 MG PO TABS
ORAL_TABLET | ORAL | Status: AC
  Filled 2013-02-03: qty 1

## 2013-02-03 MED ORDER — METOCLOPRAMIDE HCL 10 MG PO TABS: 5.0000 mg | ORAL_TABLET | Freq: Three times a day (TID) | ORAL | Status: DC | PRN

## 2013-02-03 MED ORDER — KCL IN DEXTROSE-NACL 20-5-0.45 MEQ/L-%-% IV SOLN
INTRAVENOUS | Status: AC
  Filled 2013-02-03: qty 1000

## 2013-02-03 MED ORDER — HYDROCHLOROTHIAZIDE 25 MG PO TABS
25.0000 mg | ORAL_TABLET | Freq: Every day | ORAL | Status: DC
  Administered 2013-02-04 – 2013-02-05 (×2): 25 mg via ORAL
  Filled 2013-02-03 (×3): qty 1

## 2013-02-03 MED ORDER — ATENOLOL 50 MG PO TABS
50.0000 mg | ORAL_TABLET | Freq: Every day | ORAL | Status: DC
  Administered 2013-02-04 – 2013-02-05 (×2): 50 mg via ORAL
  Filled 2013-02-03 (×2): qty 1

## 2013-02-03 MED ORDER — HYDROMORPHONE HCL PF 1 MG/ML IJ SOLN
0.2500 mg | INTRAMUSCULAR | Status: DC | PRN
  Administered 2013-02-03 (×4): 0.5 mg via INTRAVENOUS

## 2013-02-03 MED ORDER — OXYCODONE HCL 5 MG PO TABS
5.0000 mg | ORAL_TABLET | ORAL | Status: DC | PRN
  Administered 2013-02-03 – 2013-02-04 (×3): 10 mg via ORAL
  Administered 2013-02-04: 5 mg via ORAL
  Administered 2013-02-04 (×2): 10 mg via ORAL
  Filled 2013-02-03 (×3): qty 2
  Filled 2013-02-03: qty 1
  Filled 2013-02-03: qty 2

## 2013-02-03 MED ORDER — MIDAZOLAM HCL 5 MG/5ML IJ SOLN
INTRAMUSCULAR | Status: DC | PRN
  Administered 2013-02-03 (×2): 1 mg via INTRAVENOUS

## 2013-02-03 MED ORDER — MAGNESIUM CITRATE PO SOLN
1.0000 | Freq: Once | ORAL | Status: AC | PRN
  Filled 2013-02-03: qty 296

## 2013-02-03 MED ORDER — ARTIFICIAL TEARS OP OINT
TOPICAL_OINTMENT | OPHTHALMIC | Status: DC | PRN
  Administered 2013-02-03: 1 via OPHTHALMIC

## 2013-02-03 MED ORDER — METHOCARBAMOL 100 MG/ML IJ SOLN
500.0000 mg | Freq: Four times a day (QID) | INTRAMUSCULAR | Status: DC | PRN
  Filled 2013-02-03: qty 5

## 2013-02-03 MED ORDER — LEVOTHYROXINE SODIUM 50 MCG PO TABS
50.0000 ug | ORAL_TABLET | Freq: Every day | ORAL | Status: DC
  Administered 2013-02-04 – 2013-02-05 (×2): 50 ug via ORAL
  Filled 2013-02-03 (×3): qty 1

## 2013-02-03 MED ORDER — HYDROMORPHONE HCL PF 1 MG/ML IJ SOLN
INTRAMUSCULAR | Status: AC
  Filled 2013-02-03: qty 1

## 2013-02-03 MED ORDER — ONDANSETRON HCL 4 MG/2ML IJ SOLN
4.0000 mg | Freq: Four times a day (QID) | INTRAMUSCULAR | Status: DC | PRN
  Administered 2013-02-03: 4 mg via INTRAVENOUS
  Filled 2013-02-03: qty 2

## 2013-02-03 MED ORDER — MENTHOL 3 MG MT LOZG: 1.0000 | LOZENGE | OROMUCOSAL | Status: DC | PRN

## 2013-02-03 MED ORDER — DIPHENHYDRAMINE HCL 12.5 MG/5ML PO ELIX
12.5000 mg | ORAL_SOLUTION | ORAL | Status: DC | PRN
  Administered 2013-02-04: 25 mg via ORAL
  Filled 2013-02-03: qty 10

## 2013-02-03 MED ORDER — ACETAMINOPHEN 650 MG RE SUPP: 650.0000 mg | Freq: Four times a day (QID) | RECTAL | Status: DC | PRN

## 2013-02-03 MED ORDER — LACTATED RINGERS IV SOLN
INTRAVENOUS | Status: DC | PRN
  Administered 2013-02-03 (×2): via INTRAVENOUS

## 2013-02-03 MED ORDER — PANTOPRAZOLE SODIUM 40 MG PO TBEC
40.0000 mg | DELAYED_RELEASE_TABLET | Freq: Two times a day (BID) | ORAL | Status: DC
  Administered 2013-02-04 – 2013-02-05 (×3): 40 mg via ORAL
  Filled 2013-02-03 (×4): qty 1

## 2013-02-03 MED ORDER — FENTANYL CITRATE 0.05 MG/ML IJ SOLN
INTRAMUSCULAR | Status: DC | PRN
  Administered 2013-02-03 (×3): 50 ug via INTRAVENOUS
  Administered 2013-02-03: 100 ug via INTRAVENOUS
  Administered 2013-02-03 (×2): 50 ug via INTRAVENOUS

## 2013-02-03 MED ORDER — SENNOSIDES-DOCUSATE SODIUM 8.6-50 MG PO TABS: 1.0000 | ORAL_TABLET | Freq: Every evening | ORAL | Status: DC | PRN

## 2013-02-03 MED ORDER — METOCLOPRAMIDE HCL 5 MG/ML IJ SOLN
5.0000 mg | Freq: Three times a day (TID) | INTRAMUSCULAR | Status: DC | PRN
  Administered 2013-02-03: 10 mg via INTRAVENOUS
  Filled 2013-02-03: qty 2

## 2013-02-03 MED ORDER — GABAPENTIN 300 MG PO CAPS
300.0000 mg | ORAL_CAPSULE | Freq: Every day | ORAL | Status: DC
  Administered 2013-02-03 – 2013-02-05 (×9): 300 mg via ORAL
  Filled 2013-02-03 (×13): qty 1

## 2013-02-03 MED ORDER — GLYCOPYRROLATE 0.2 MG/ML IJ SOLN
INTRAMUSCULAR | Status: DC | PRN
  Administered 2013-02-03: 0.6 mg via INTRAVENOUS
  Administered 2013-02-03: 0.1 mg via INTRAVENOUS

## 2013-02-03 MED ORDER — SIMVASTATIN 20 MG PO TABS
20.0000 mg | ORAL_TABLET | Freq: Every evening | ORAL | Status: DC
  Administered 2013-02-03 – 2013-02-04 (×2): 20 mg via ORAL
  Filled 2013-02-03 (×3): qty 1

## 2013-02-03 MED ORDER — PHENOL 1.4 % MT LIQD: 1.0000 | OROMUCOSAL | Status: DC | PRN

## 2013-02-03 MED ORDER — ROCURONIUM BROMIDE 100 MG/10ML IV SOLN
INTRAVENOUS | Status: DC | PRN
  Administered 2013-02-03: 40 mg via INTRAVENOUS
  Administered 2013-02-03: 10 mg via INTRAVENOUS

## 2013-02-03 MED ORDER — PROPOFOL 10 MG/ML IV BOLUS
INTRAVENOUS | Status: DC | PRN
  Administered 2013-02-03: 170 mg via INTRAVENOUS

## 2013-02-03 MED ORDER — HYDROMORPHONE HCL PF 1 MG/ML IJ SOLN: 0.5000 mg | INTRAMUSCULAR | Status: DC | PRN

## 2013-02-03 MED ORDER — FLUTICASONE PROPIONATE 50 MCG/ACT NA SUSP
1.0000 | Freq: Every day | NASAL | Status: DC
  Administered 2013-02-04 – 2013-02-05 (×2): 1 via NASAL
  Filled 2013-02-03 (×2): qty 16

## 2013-02-03 MED ORDER — ASPIRIN EC 325 MG PO TBEC
325.0000 mg | DELAYED_RELEASE_TABLET | Freq: Every day | ORAL | Status: DC
  Administered 2013-02-04 – 2013-02-05 (×2): 325 mg via ORAL
  Filled 2013-02-03 (×3): qty 1

## 2013-02-03 MED ORDER — ONDANSETRON HCL 4 MG/2ML IJ SOLN: 4.0000 mg | Freq: Once | INTRAMUSCULAR | Status: DC | PRN

## 2013-02-03 MED ORDER — SODIUM CHLORIDE 0.9 % IR SOLN
Status: DC | PRN
  Administered 2013-02-03: 1000 mL

## 2013-02-03 MED ORDER — ONDANSETRON HCL 4 MG PO TABS: 4.0000 mg | ORAL_TABLET | Freq: Four times a day (QID) | ORAL | Status: DC | PRN

## 2013-02-03 MED ORDER — DEXAMETHASONE SODIUM PHOSPHATE 4 MG/ML IJ SOLN
INTRAMUSCULAR | Status: DC | PRN
  Administered 2013-02-03: 4 mg via INTRAVENOUS

## 2013-02-03 MED ORDER — ONDANSETRON HCL 4 MG/2ML IJ SOLN
INTRAMUSCULAR | Status: DC | PRN
  Administered 2013-02-03: 4 mg via INTRAVENOUS

## 2013-02-03 MED ORDER — CLINDAMYCIN PHOSPHATE 600 MG/50ML IV SOLN
INTRAVENOUS | Status: AC
  Filled 2013-02-03: qty 50

## 2013-02-03 MED ORDER — BISACODYL 5 MG PO TBEC: 5.0000 mg | DELAYED_RELEASE_TABLET | Freq: Every day | ORAL | Status: DC | PRN

## 2013-02-03 MED ORDER — BUPIVACAINE-EPINEPHRINE 0.5% -1:200000 IJ SOLN
INTRAMUSCULAR | Status: DC | PRN
  Administered 2013-02-03: 20 mL

## 2013-02-03 MED ORDER — DEXTROSE-NACL 5-0.45 % IV SOLN: INTRAVENOUS | Status: DC

## 2013-02-03 SURGICAL SUPPLY — 55 items
BLADE SAW SAG 73X25 THK (BLADE) ×1
BLADE SAW SGTL 18X1.27X75 (BLADE) IMPLANT
BLADE SAW SGTL 73X25 THK (BLADE) ×1 IMPLANT
BLADE SAW SGTL MED 73X18.5 STR (BLADE) IMPLANT
BRUSH FEMORAL CANAL (MISCELLANEOUS) IMPLANT
CAPT HIP PF COP ×1 IMPLANT
CLOTH BEACON ORANGE TIMEOUT ST (SAFETY) ×2 IMPLANT
COVER BACK TABLE 24X17X13 BIG (DRAPES) IMPLANT
COVER SURGICAL LIGHT HANDLE (MISCELLANEOUS) ×3 IMPLANT
DRAPE ORTHO SPLIT 77X108 STRL (DRAPES) ×2
DRAPE PROXIMA HALF (DRAPES) ×2 IMPLANT
DRAPE SURG ORHT 6 SPLT 77X108 (DRAPES) ×1 IMPLANT
DRAPE U-SHAPE 47X51 STRL (DRAPES) ×2 IMPLANT
DRILL BIT 7/64X5 (BIT) ×2 IMPLANT
DRSG MEPILEX BORDER 4X12 (GAUZE/BANDAGES/DRESSINGS) ×2 IMPLANT
DRSG MEPILEX BORDER 4X8 (GAUZE/BANDAGES/DRESSINGS) ×1 IMPLANT
DURAPREP 26ML APPLICATOR (WOUND CARE) ×2 IMPLANT
ELECT BLADE 4.0 EZ CLEAN MEGAD (MISCELLANEOUS) ×2
ELECT REM PT RETURN 9FT ADLT (ELECTROSURGICAL) ×2
ELECTRODE BLDE 4.0 EZ CLN MEGD (MISCELLANEOUS) IMPLANT
ELECTRODE REM PT RTRN 9FT ADLT (ELECTROSURGICAL) ×1 IMPLANT
GAUZE XEROFORM 1X8 LF (GAUZE/BANDAGES/DRESSINGS) ×3 IMPLANT
GLOVE BIO SURGEON STRL SZ7.5 (GLOVE) ×2 IMPLANT
GLOVE BIO SURGEON STRL SZ8.5 (GLOVE) ×4 IMPLANT
GLOVE BIOGEL PI IND STRL 8 (GLOVE) ×2 IMPLANT
GLOVE BIOGEL PI IND STRL 9 (GLOVE) ×1 IMPLANT
GLOVE BIOGEL PI INDICATOR 8 (GLOVE) ×2
GLOVE BIOGEL PI INDICATOR 9 (GLOVE) ×1
GOWN PREVENTION PLUS XLARGE (GOWN DISPOSABLE) ×3 IMPLANT
GOWN STRL NON-REIN LRG LVL3 (GOWN DISPOSABLE) ×3 IMPLANT
GOWN STRL REIN XL XLG (GOWN DISPOSABLE) ×3 IMPLANT
HANDPIECE INTERPULSE COAX TIP (DISPOSABLE)
HOOD PEEL AWAY FACE SHEILD DIS (HOOD) ×4 IMPLANT
KIT BASIN OR (CUSTOM PROCEDURE TRAY) ×2 IMPLANT
KIT ROOM TURNOVER OR (KITS) ×2 IMPLANT
MANIFOLD NEPTUNE II (INSTRUMENTS) ×2 IMPLANT
NEEDLE 22X1 1/2 (OR ONLY) (NEEDLE) ×2 IMPLANT
NS IRRIG 1000ML POUR BTL (IV SOLUTION) ×2 IMPLANT
PACK TOTAL JOINT (CUSTOM PROCEDURE TRAY) ×2 IMPLANT
PAD ARMBOARD 7.5X6 YLW CONV (MISCELLANEOUS) ×4 IMPLANT
PASSER SUT SWANSON 36MM LOOP (INSTRUMENTS) ×2 IMPLANT
PRESSURIZER FEMORAL UNIV (MISCELLANEOUS) IMPLANT
SET HNDPC FAN SPRY TIP SCT (DISPOSABLE) IMPLANT
SUT ETHIBOND 2 V 37 (SUTURE) ×2 IMPLANT
SUT ETHILON 3 0 FSL (SUTURE) ×2 IMPLANT
SUT VIC AB 0 CTB1 27 (SUTURE) ×2 IMPLANT
SUT VIC AB 1 CTX 36 (SUTURE) ×2
SUT VIC AB 1 CTX36XBRD ANBCTR (SUTURE) ×1 IMPLANT
SUT VIC AB 2-0 CTB1 (SUTURE) ×2 IMPLANT
SYR CONTROL 10ML LL (SYRINGE) ×2 IMPLANT
TOWEL OR 17X24 6PK STRL BLUE (TOWEL DISPOSABLE) ×2 IMPLANT
TOWEL OR 17X26 10 PK STRL BLUE (TOWEL DISPOSABLE) ×2 IMPLANT
TOWER CARTRIDGE SMART MIX (DISPOSABLE) IMPLANT
TRAY FOLEY CATH 16FRSI W/METER (SET/KITS/TRAYS/PACK) ×1 IMPLANT
WATER STERILE IRR 1000ML POUR (IV SOLUTION) ×8 IMPLANT

## 2013-02-03 NOTE — Preoperative (Signed)
Beta Blockers   Reason not to administer Beta Blockers:Not Applicable 

## 2013-02-03 NOTE — Progress Notes (Signed)
02/03/2013 1600 Received call from [facility], pt is active with their service for [facility]. Isidoro Donning RN CCM Case Mgmt phone [phone]

## 2013-02-03 NOTE — Progress Notes (Signed)
Orthopedic Tech Progress Note Patient Details:  Kari [name] [birth year] [id number]  Ortho Devices Ortho Device/Splint Location: applied overhead frame to bed Ortho Device/Splint Interventions: Ordered;Application   Jennye Moccasin 02/03/2013, 5:10 PM

## 2013-02-03 NOTE — Op Note (Signed)
OPERATIVE REPORT    DATE OF PROCEDURE:  02/03/2013       PREOPERATIVE DIAGNOSIS:  RHEUMATOID ARTHRITIS LEFT HIP                                                          POSTOPERATIVE DIAGNOSIS:  RHEUMATOID ARTHRITIS LEFT HIP, Developmental Hip Dysplasia                                                          PROCEDURE:  L total hip arthroplasty using a 48 mm DePuy Pinnacle  Cup, [employer], 10-degree polyethylene liner index superior  and posterior, a +0 32 mm ceramic head, a 18x13x42x150 SROM stem, 18Dsm Sleeve   SURGEON: Zhavia Cunanan J    ASSISTANT:   Eric K. [employer]  (present throughout entire procedure and necessary for timely completion of the procedure)   ANESTHESIA: General BLOOD LOSS: 500 FLUID REPLACEMENT: 1800 crystalloid DRAINS: Foley Catheter URINE OUTPUT: 300cc COMPLICATIONS: none    INDICATIONS FOR PROCEDURE: A 62 y.o. year-old With  RHEUMATOID ARTHRITIS LEFT HIP   for 3 years, x-rays show near bone-on-bone arthritic changes,and a high neck shaft angle of almost 155. Despite conservative measures with observation, anti-inflammatory medicine, narcotics, use of a cane, has severe unremitting pain and can ambulate only a few blocks before resting.  Patient desires elective L total hip arthroplasty to decrease pain and increase function. Because of the high neck shaft angle and S-ROM stem is indicated to compensate for anticipated over anteversion of the neck of the femur.The risks, benefits, and alternatives were discussed at length including but not limited to the risks of infection, bleeding, nerve injury, stiffness, blood clots, the need for revision surgery, cardiopulmonary complications, among others, and they were willing to proceed. Questions answered     PROCEDURE IN DETAIL: The patient was identified by armband,  received preoperative IV antibiotics in the holding area at [facility], taken to the operating room , appropriate anesthetic  monitors  were attached and general endotracheal anesthesia induced. Foley catheter was inserted. Pt was rolled into the R lateral decubitus position and fixed there with a Stulberg Mark II pelvic clamp.  The L lower extremity was then prepped and draped  in the usual sterile fashion from the ankle to the hemipelvis. A time-out  procedure was performed. The skin along the lateral hip and thigh  infiltrated with 10 mL of 0.5% Marcaine and epinephrine solution. We  then made a posterolateral approach to the hip. With a #10 blade, a 18 cm  incision was made through the skin and subcutaneous tissue down to the level of the  IT band. Small bleeders were identified and cauterized. The IT band was cut in  line with skin incision exposing the greater trochanter. A Cobra retractor was placed between the gluteus minimus and the superior hip joint capsule, and a spiked Cobra between the quadratus femoris and the inferior hip joint capsule. This isolated the short  external rotators and piriformis tendons. These were tagged with a #2 Ethibond  suture and cut off their insertion on the intertrochanteric crest. The posterior  capsule was then developed into an acetabular-based flap from Posterior Superior off of the acetabulum out over the femoral neck and back posterior inferior to the acetabular rim. This flap was tagged with two #2 Ethibond sutures and retracted protecting the sciatic nerve. This exposed the arthritic femoral head and osteophytes. The hip was then flexed and internally rotated, dislocating the femoral head and a standard neck cut performed 1 fingerbreadth above the lesser trochanter.  A spiked Cobra was placed in the cotyloid notch and a Hohmann retractor was then used to lever the femur anteriorly off of the anterior pelvic column. A posterior-inferior wing retractor was placed at the junction of the acetabulum and the ischium completing the acetabular exposure.We then removed the peripheral  osteophytes and labrum from the acetabulum. We then reamed the acetabulum up to 47 mm with basket reamers obtaining good coverage in all quadrants. We then irrigated with normal  saline solution and hammered into place a 48 mm pinnacle cup in 45  degrees of abduction and about 20 degrees of anteversion. More  peripheral osteophytes removed and a trial 10-degree liner placed with the  index superior-posterior. The hip was then flexed and internally rotated exposing the  proximal femur, which was entered with the initiating reamer followed by  the axial reamers up to a 13.5 mm full depth and 14mm partial depth. We then conically reamed to 18D to the correct depth for a 42 base neck. The calcar was milled to 18Dsm. A trial cone and stem was inserted in the 25 degrees anteversion, which was actually retroverted 35 in relation to the calcar as the patient's natural anteversion was almost 60, with a +0 32mm trial head. Trial reduction was then performed and excellent stability was noted with at 90 of flexion with 70 of internal rotation and then full extension with maximal external rotation. The hip could not be dislocated in full extension. The knee could easily flex  to about 130 degrees. We also stretched the abductors at this point,  because of the preexisting adductor contractures. All trial components  were then removed. The acetabulum was irrigated out with normal saline  solution. A titanium [facility] was then screwed into place  followed by a 10-degree polyethylene liner index superior-posterior. On  the femoral side a 18Dsm ZTT1 sleeve was hammered into place, followed by a 18x13x150x42 SROM stem in 25 degrees of anteversion. At this point, a +0 32 mm ceramic head was  hammered on the stem. The hip was reduced. We checked our stability  one more time and found it to be excellent. The wound was once again  thoroughly irrigated out with normal saline solution pulse lavage. The   capsular flap and short external rotators were repaired back to the  intertrochanteric crest through drill holes with a #2 Ethibond suture.  The IT band was closed with running 1 Vicryl suture. The subcutaneous  tissue with 0 and 2-0 undyed Vicryl suture and the skin with running  interlocking 3-0 nylon suture. Dressing of Xeroform and Mepilex was  then applied. The patient was then unclamped, rolled supine, awaken extubated and taken to recovery room without difficulty in stable condition.   Arnisha Laffoon J 02/03/2013, 1:46 PM

## 2013-02-03 NOTE — Anesthesia Preprocedure Evaluation (Signed)
Anesthesia Evaluation  Patient identified by MRN, date of birth, ID band Patient awake    Reviewed: Allergy & Precautions, H&P , NPO status , Patient's Chart, lab work & pertinent test results  Airway Mallampati: II TM Distance: >3 FB Neck ROM: Full    Dental  (+) Teeth Intact and Dental Advisory Given   Pulmonary  breath sounds clear to auscultation        Cardiovascular Rhythm:Regular Rate:Normal     Neuro/Psych    GI/Hepatic   Endo/Other    Renal/GU      Musculoskeletal   Abdominal   Peds  Hematology   Anesthesia Other Findings   Reproductive/Obstetrics                           Anesthesia Physical Anesthesia Plan  ASA: III  Anesthesia Plan: General   Post-op Pain Management:    Induction: Intravenous  Airway Management Planned: Oral ETT  Additional Equipment:   Intra-op Plan:   Post-operative Plan: Extubation in OR  Informed Consent: I have reviewed the patients History and Physical, chart, labs and discussed the procedure including the risks, benefits and alternatives for the proposed anesthesia with the patient or authorized representative who has indicated his/her understanding and acceptance.   Dental advisory given  Plan Discussed with: CRNA and Anesthesiologist  Anesthesia Plan Comments:         Anesthesia Quick Evaluation

## 2013-02-03 NOTE — Anesthesia Postprocedure Evaluation (Signed)
  Anesthesia Post-op Note  Patient: Kari [name]  Procedure(s) Performed: Procedure(s): TOTAL HIP ARTHROPLASTY (Left)  Patient Location: PACU  Anesthesia Type:General  Level of Consciousness: awake, alert  and oriented  Airway and Oxygen Therapy: Patient Spontanous Breathing  Post-op Pain: mild  Post-op Assessment: Post-op Vital signs reviewed, Patient's Cardiovascular Status Stable, Respiratory Function Stable, Patent Airway and Pain level controlled  Post-op Vital Signs: stable  Complications: No apparent anesthesia complications

## 2013-02-03 NOTE — Transfer of Care (Signed)
Immediate Anesthesia Transfer of Care Note  Patient: Kari [name]  Procedure(s) Performed: Procedure(s): TOTAL HIP ARTHROPLASTY (Left)  Patient Location: PACU  Anesthesia Type:General  Level of Consciousness: awake, alert  and oriented  Airway & Oxygen Therapy: Patient Spontanous Breathing and Patient connected to nasal cannula oxygen  Post-op Assessment: Report given to PACU RN, Post -op Vital signs reviewed and stable and Patient moving all extremities X 4  Post vital signs: Reviewed and stable  Complications: No apparent anesthesia complications

## 2013-02-03 NOTE — Anesthesia Procedure Notes (Signed)
Procedure Name: Intubation Date/Time: 02/03/2013 12:17 PM Performed by: Gayla Medicus Pre-anesthesia Checklist: Patient identified, Timeout performed, Emergency Drugs available, Suction available and Patient being monitored Patient Re-evaluated:Patient Re-evaluated prior to inductionOxygen Delivery Method: Circle system utilized Preoxygenation: Pre-oxygenation with 100% oxygen Intubation Type: IV induction Ventilation: Mask ventilation without difficulty and Oral airway inserted - appropriate to patient size Laryngoscope Size: Mac and 3 Grade View: Grade I Tube type: Oral Tube size: 7.0 mm Number of attempts: 1 Airway Equipment and Method: Stylet Placement Confirmation: ETT inserted through vocal cords under direct vision,  positive ETCO2 and breath sounds checked- equal and bilateral Secured at: 21 cm Tube secured with: Tape Dental Injury: Teeth and Oropharynx as per pre-operative assessment

## 2013-02-03 NOTE — Interval H&P Note (Signed)
History and Physical Interval Note:  02/03/2013 11:35 AM  Kari [name]  has presented today for surgery, with the diagnosis of RHEUMATOID ARTHRITIS LEFT HIP  The various methods of treatment have been discussed with the patient and family. After consideration of risks, benefits and other options for treatment, the patient has consented to  Procedure(s): TOTAL HIP ARTHROPLASTY (Left) as a surgical intervention .  The patient's history has been reviewed, patient examined, no change in status, stable for surgery.  I have reviewed the patient's chart and labs.  Questions were answered to the patient's satisfaction.     Nestor Lewandowsky

## 2013-02-04 ENCOUNTER — Encounter (HOSPITAL_COMMUNITY): Payer: Self-pay | Admitting: Orthopedic Surgery

## 2013-02-04 LAB — CBC
HCT: 27.9 % — ABNORMAL LOW (ref 36.0–46.0)
Hemoglobin: 10.3 g/dL — ABNORMAL LOW (ref 12.0–15.0)
MCH: 34.3 pg — ABNORMAL HIGH (ref 26.0–34.0)
MCHC: 36.9 g/dL — ABNORMAL HIGH (ref 30.0–36.0)
MCV: 93 fL (ref 78.0–100.0)
Platelets: 214 10*3/uL (ref 150–400)
RBC: 3 MIL/uL — ABNORMAL LOW (ref 3.87–5.11)
RDW: 12.9 % (ref 11.5–15.5)
WBC: 13.6 10*3/uL — ABNORMAL HIGH (ref 4.0–10.5)

## 2013-02-04 LAB — BASIC METABOLIC PANEL
BUN: 11 mg/dL (ref 6–23)
CO2: 25 mEq/L (ref 19–32)
Calcium: 8.2 mg/dL — ABNORMAL LOW (ref 8.4–10.5)
Chloride: 101 mEq/L (ref 96–112)
Creatinine, Ser: 0.59 mg/dL (ref 0.50–1.10)
GFR calc Af Amer: >90 mL/min (ref >90)
GFR calc non Af Amer: >90 mL/min (ref >90)
Glucose, Bld: 148 mg/dL — ABNORMAL HIGH (ref 70–99)
Potassium: 3.5 mEq/L (ref 3.5–5.1)
Sodium: 137 mEq/L (ref 135–145)

## 2013-02-04 NOTE — Progress Notes (Signed)
SW received a consult for possible placement. PT  At this time is recommending home with HH and not SNF. CSW will make CM aware. Clinical Social Worker will sign off for now as social work intervention is no longer needed. Please consult us again if new need arises.   Nathin Saran, MSW [phone] 

## 2013-02-04 NOTE — Progress Notes (Signed)
UR COMPLETED  

## 2013-02-04 NOTE — Progress Notes (Signed)
Physical Therapy Treatment Patient Details Name: Kari [name] MRN: [id number] DOB: 06-21-1951 Today's Date: 02/04/2013 Time: 8657-8469 PT Time Calculation (min): 41 min  PT Assessment / Plan / Recommendation  History of Present Illness Pt is a 62 y/o female s/p L THA on 02/04/2013   PT Comments   Pt progressing well towards physical therapy goals. Pt was able to negotiate well in bathroom to void, clean up and wash hands while maintaining hip precautions. Pt was able to improve gait pattern and achieve a step-through gait pattern with cueing for sequencing and technique with the walker. Husband pushing for pt to attempt steps, and reports she is to d/c tomorrow.  Follow Up Recommendations  Home health PT     Does the patient have the potential to tolerate intense rehabilitation     Barriers to Discharge        Equipment Recommendations  None recommended by PT    Recommendations for Other Services    Frequency 7X/week   Progress towards PT Goals Progress towards PT goals: Progressing toward goals  Plan Current plan remains appropriate    Precautions / Restrictions Precautions Precautions: Posterior Hip;Fall Precaution Booklet Issued: Yes (comment) Restrictions Weight Bearing Restrictions: Yes LLE Weight Bearing: Weight bearing as tolerated   Pertinent Vitals/Pain Pt reports 5/10 pain at rest, and 8/10 pain at end of session. Pt reports she has not had afternoon pain medication because she was sleeping.    Mobility  Bed Mobility Bed Mobility: Sit to Supine;Scooting to HOB Sit to Supine: 3: Mod assist Scooting to HOB: 5: Set up Details for Bed Mobility Assistance: Pt was cued for sequencing and safety awareness with hip precautions Transfers Transfers: Sit to Stand;Stand to Sit Sit to Stand: 4: Min guard;With upper extremity assist;From chair/3-in-1 Stand to Sit: 4: Min guard;To chair/3-in-1;With upper extremity assist Details for Transfer Assistance: Pt was cued for hand  placement on seated surface and walker placement in front of pt before reaching back for UE support Ambulation/Gait Ambulation/Gait Assistance: 4: Min guard Ambulation Distance (Feet): 100 Feet Assistive device: Rolling walker Ambulation/Gait Assistance Details: Pt cued for step-through gait pattern, fluidity of walker movement, and increased heel strike bilaterally Gait Pattern: Step-through pattern;Decreased dorsiflexion - right;Decreased dorsiflexion - left;Decreased weight shift to left;Decreased stride length Gait velocity: decreased Stairs: No    Exercises Total Joint Exercises Ankle Circles/Pumps: 10 reps;Both Quad Sets: 10 reps;Both Heel Slides: 10 reps;Left Hip ABduction/ADduction: 10 reps;Left   PT Diagnosis:    PT Problem List:   PT Treatment Interventions:     PT Goals (current goals can now be found in the care plan section) Acute Rehab PT Goals Patient Stated Goal: To return home with husband at d/c PT Goal Formulation: With patient/family Time For Goal Achievement: 02/11/13 Potential to Achieve Goals: Good  Visit Information  Last PT Received On: 02/04/13 Assistance Needed: +1 History of Present Illness: Pt is a 62 y/o female s/p L THA on 02/04/2013    Subjective Data  Subjective: "I need to use the bathroom." Patient Stated Goal: To return home with husband at d/c   Cognition  Cognition Arousal/Alertness: Awake/alert Behavior During Therapy: WFL for tasks assessed/performed Overall Cognitive Status: Within Functional Limits for tasks assessed    Balance  Balance Balance Assessed: Yes Static Standing Balance Static Standing - Balance Support: No upper extremity supported Static Standing - Level of Assistance: 5: Stand by assistance Static Standing - Comment/# of Minutes: 1 minute while washing hands at sink  End of  Session PT - End of Session Equipment Utilized During Treatment: Gait belt Activity Tolerance: Patient tolerated treatment well Patient  left: in bed;with call bell/phone within reach;with family/visitor present   GP     Ruthann Cancer 02/04/2013, 2:50 PM  Ruthann Cancer, PT Acute Rehabilitation Services

## 2013-02-04 NOTE — Evaluation (Addendum)
Physical Therapy Evaluation Patient Details Name: Kari [name] MRN: [id number] DOB: [birth year] Today's Date: 02/04/2013 Time: 4098-1191 PT Time Calculation (min): 28 min  PT Assessment / Plan / Recommendation History of Present Illness  Pt is a 62 y/o female s/p L THA on 02/03/2013  Clinical Impression  Pt presents to PT with decreased strength, independence with transfers and ambulation, and AROM due to pain s/p L THA. She is currently living with husband in a one story home with 3 steps to enter, and is planning to d/c there with HHPT to follow. At time of PT evaluation, pt required min assist for bed mobility and supine>sit transfer, and min guard for other transfers and ambulation - 15 feet with RW. This patient would benefit from further skilled PT interventions to address functional limitations, improve safety and independence with functional mobility, and return to PLOF.    PT Assessment  Patient needs continued PT services    Follow Up Recommendations  Home health PT    Does the patient have the potential to tolerate intense rehabilitation      Barriers to Discharge        Equipment Recommendations  None recommended by PT    Recommendations for Other Services     Frequency 7X/week    Precautions / Restrictions Precautions Precautions: Posterior Hip;Fall Precaution Booklet Issued: Yes (comment) Restrictions Weight Bearing Restrictions: Yes LLE Weight Bearing: Weight bearing as tolerated   Pertinent Vitals/Pain Pt reports 7/10 pain at rest, and 9/10 pain after transfer to sitting on EOB. Pt reports a decrease in pain after ambulation was initiated but was not rated on 0-10 scale.      Mobility  Bed Mobility Bed Mobility: Supine to Sit;Sitting - Scoot to Edge of Bed Supine to Sit: 4: Min assist;HOB elevated;With rails Sitting - Scoot to Edge of Bed: 4: Min guard;With rail Details for Bed Mobility Assistance: Pt was cued for hand placement on railings and sequencing   Transfers Transfers: Sit to Stand;Stand to Sit Sit to Stand: 4: Min guard;From bed;With upper extremity assist Stand to Sit: 4: Min guard;To chair/3-in-1;With upper extremity assist Details for Transfer Assistance: Pt was cued for LLE placement, and hand placement on seated surface prior to initiating transfer Ambulation/Gait Ambulation/Gait Assistance: 4: Min guard Ambulation Distance (Feet): 15 Feet Assistive device: Rolling walker Ambulation/Gait Assistance Details: Pt was cued for WBAT status on L, sequencing with the walker, and for maintaining hip precautions throughout gait training Gait Pattern: Step-to pattern;Decreased dorsiflexion - left;Decreased hip/knee flexion - left Stairs: No    Exercises Total Joint Exercises Ankle Circles/Pumps: 10 reps;Both Quad Sets: 10 reps;Both Hip ABduction/ADduction: 10 reps;Left Long Arc Quad: 10 reps;Left   PT Diagnosis: Difficulty walking;Acute pain  PT Problem List: Decreased strength;Decreased range of motion;Decreased activity tolerance;Decreased balance;Decreased mobility;Decreased knowledge of use of DME;Decreased safety awareness;Decreased knowledge of precautions;Pain PT Treatment Interventions: DME instruction;Gait training;Stair training;Functional mobility training;Therapeutic activities;Therapeutic exercise;Neuromuscular re-education;Patient/family education     PT Goals(Current goals can be found in the care plan section) Acute Rehab PT Goals Patient Stated Goal: To return home with husband at d/c PT Goal Formulation: With patient/family Time For Goal Achievement: 02/11/13 Potential to Achieve Goals: Good  Visit Information  Last PT Received On: 02/04/13 Assistance Needed: +1 History of Present Illness: Pt is a 62 y/o female s/p L THA on 02/04/2013       Prior Functioning  Home Living Family/patient expects to be discharged to:: Private residence Living Arrangements: Spouse/significant other Available Help at  Discharge: Family;Available 24 hours/day Type of Home: House Home Access: Stairs to enter [employer] of Steps: 3 Entrance Stairs-Rails: Can reach both Home Layout: One level Home Equipment: Crutches;Cane - single point;Walker - 2 wheels;Shower seat - built in Prior Function Level of Independence: Independent Communication Communication: No difficulties Dominant Hand: Left    Cognition  Cognition Arousal/Alertness: Awake/alert Behavior During Therapy: WFL for tasks assessed/performed Overall Cognitive Status: Within Functional Limits for tasks assessed    Extremity/Trunk Assessment Upper Extremity Assessment Upper Extremity Assessment: Defer to OT evaluation Lower Extremity Assessment Lower Extremity Assessment: LLE deficits/detail LLE Deficits / Details: Decreased AROM, pain and decreased strength consistent with L THA Cervical / Trunk Assessment Cervical / Trunk Assessment: Normal   Balance Balance Balance Assessed: Yes Static Sitting Balance Static Sitting - Balance Support: No upper extremity supported;Feet supported Static Sitting - Level of Assistance: 7: Independent Static Sitting - Comment/# of Minutes: 3 Static Standing Balance Static Standing - Balance Support: Bilateral upper extremity supported Static Standing - Level of Assistance: 5: Stand by assistance Static Standing - Comment/# of Minutes: 2  End of Session PT - End of Session Equipment Utilized During Treatment: Gait belt Activity Tolerance: Patient tolerated treatment well Patient left: in chair;with call bell/phone within reach;with family/visitor present Nurse Communication: Patient requests pain meds  GP     Ruthann Cancer 02/04/2013, 10:13 AM  Ruthann Cancer, PT Acute Rehabilitation Services

## 2013-02-04 NOTE — Progress Notes (Signed)
Patient ID: Kari [name], female   DOB: [birth year], 62 y.o.   MRN: [id number] PATIENT ID: Kari [name]  MRN: [id number]  DOB/AGE:  1951-06-08 / 62 y.o.  1 Day Post-Op Procedure(s) (LRB): TOTAL HIP ARTHROPLASTY (Left)    PROGRESS NOTE Subjective: Patient is alert, oriented,1x Nausea, no Vomiting, yes passing gas, no Bowel Movement. Taking PO well. Denies SOB, Chest or Calf Pain. Using Incentive Spirometer, PAS in place. Ambulate WBAT todat Patient reports pain as 3 on 0-10 scale  .    Objective: Vital signs in last 24 hours: Filed Vitals:   02/03/13 1530 02/03/13 1545 02/03/13 2000 02/04/13 0521  BP: 111/49  108/50 107/58  Pulse: 52 61 58 78  Temp:  98 F (36.7 C) 97.6 F (36.4 C) 97.6 F (36.4 C)  TempSrc:   Oral Oral  Resp: 16 16 16 14   SpO2: 100% 99% 100% 99%      Intake/Output from previous day: I/O last 3 completed shifts: In: 2546.3 [P.O.:400; I.V.:2146.3] Out: 1600 [Urine:1100; Blood:500]   Intake/Output this shift: Total I/O In: 60 [P.O.:60] Out: -    LABORATORY DATA:  Recent Labs  02/04/13 0500  WBC 13.6*  HGB 10.3*  HCT 27.9*  PLT 214  NA 137  K 3.5  CL 101  CO2 25  BUN 11  CREATININE 0.59  GLUCOSE 148*  CALCIUM 8.2*    Examination: Neurologically intact ABD soft Neurovascular intact Sensation intact distally Intact pulses distally Dorsiflexion/Plantar flexion intact Incision: no drainage No cellulitis present Compartment soft} XR AP&Lat of hip shows well placed\fixed THA  Assessment:   1 Day Post-Op Procedure(s) (LRB): TOTAL HIP ARTHROPLASTY (Left) ADDITIONAL DIAGNOSIS:  Hypertension and fibromyalgia, hypothyroidism  Plan: PT/OT WBAT, THA  posterior precautions  DVT Prophylaxis: SCDx72 hrs, ASA 325 mg BID x 2 weeks  DISCHARGE PLAN: Home  DISCHARGE NEEDS: HHPT, HHRN, CPM, Walker and 3-in-1 comode seat

## 2013-02-05 LAB — CBC
HCT: 26.3 % — ABNORMAL LOW (ref 36.0–46.0)
Hemoglobin: 9.4 g/dL — ABNORMAL LOW (ref 12.0–15.0)
MCH: 33.6 pg (ref 26.0–34.0)
MCHC: 35.7 g/dL (ref 30.0–36.0)
MCV: 93.9 fL (ref 78.0–100.0)
Platelets: 183 10*3/uL (ref 150–400)
RBC: 2.8 MIL/uL — ABNORMAL LOW (ref 3.87–5.11)
RDW: 13.1 % (ref 11.5–15.5)
WBC: 10.5 10*3/uL (ref 4.0–10.5)

## 2013-02-05 MED ORDER — OXYCODONE-ACETAMINOPHEN 5-325 MG PO TABS: 1.0000 | ORAL_TABLET | ORAL | Status: DC | PRN

## 2013-02-05 MED ORDER — METHOCARBAMOL 500 MG PO TABS: 500.0000 mg | ORAL_TABLET | Freq: Two times a day (BID) | ORAL | Status: DC

## 2013-02-05 MED ORDER — ASPIRIN EC 325 MG PO TBEC: 325.0000 mg | DELAYED_RELEASE_TABLET | Freq: Two times a day (BID) | ORAL | Status: DC

## 2013-02-05 NOTE — Evaluation (Signed)
Occupational Therapy Evaluation Patient Details Name: Kari [name] MRN: [id number] DOB: 11-14-1950 Today's Date: 02/05/2013 Time: 4098-1191 OT Time Calculation (min): 26 min  OT Assessment / Plan / Recommendation History of present illness Pt is a 62 y/o female s/p L THA on 02/04/2013   Clinical Impression    Patient evaluated by Occupational Therapy with no further acute OT needs identified. All education has been completed and the patient has no further questions. Husband very supportive and able to assist as needed at home.  Spouse acquired reacher for home.  See below for any follow-up Occupational Therapy or equipment needs. OT is signing off. Thank you for this referral.   OT Assessment  Patient does not need any further OT services    Follow Up Recommendations  No OT follow up;Supervision - Intermittent    Barriers to Discharge      Equipment Recommendations  3 in 1 bedside comode    Recommendations for Other Services    Frequency       Precautions / Restrictions Precautions Precautions: Posterior Hip;Fall Precaution Booklet Issued: Yes (comment) Restrictions Weight Bearing Restrictions: Yes LLE Weight Bearing: Weight bearing as tolerated   Pertinent Vitals/Pain     ADL  Eating/Feeding: Independent Where Assessed - Eating/Feeding: Chair Grooming: Wash/dry hands;Wash/dry face;Teeth care;Denture care;Supervision/safety Where Assessed - Grooming: Supported standing Upper Body Bathing: Supervision/safety Where Assessed - Upper Body Bathing: Unsupported sitting Lower Body Bathing: Supervision/safety Where Assessed - Lower Body Bathing: Supported sit to stand Upper Body Dressing: Supervision/safety Where Assessed - Upper Body Dressing: Unsupported sitting Lower Body Dressing: Moderate assistance Where Assessed - Lower Body Dressing: Supported sit to Pharmacist, hospital: Radiographer, therapeutic Method: Sit to stand;Stand Wellsite geologist:  Comfort height toilet Toileting - Architect and Hygiene: Supervision/safety Where Assessed - Engineer, mining and Hygiene: Standing Equipment Used: Reacher;Rolling walker Transfers/Ambulation Related to ADLs: supervision ADL Comments: Pt instructed in posterior THA.  She is able to state precautions, but was unaware of rationale for precautions.  Risk of dislocation explained to pt.  Pt and spouse were instructed in use of AE for LB ADLs.  Pt able to don pants with reacher and doff socks.  She reports spouse will assist with socks, if needed at home, and shoes.  She also reports she has long handled brush for home use.  Pt instructed on method for shower transfer, but instructed her to practice with HHPT before performing on her own.  She verbalizes understanding/agreement with all     OT Diagnosis:    OT Problem List:   OT Treatment Interventions:     OT Goals(Current goals can be found in the care plan section)    Visit Information  Last OT Received On: 02/05/13 Assistance Needed: +1 History of Present Illness: Pt is a 62 y/o female s/p L THA on 02/04/2013       Prior Functioning     Home Living Family/patient expects to be discharged to:: Private residence Living Arrangements: Spouse/significant other Available Help at Discharge: Family;Available 24 hours/day Type of Home: House Home Access: Stairs to enter [employer] of Steps: 3 Entrance Stairs-Rails: Can reach both Home Layout: One level Home Equipment: Crutches;Cane - single point;Walker - 2 wheels;Shower seat - built in Prior Function Level of Independence: Independent Communication Communication: No difficulties Dominant Hand: Left         Vision/Perception     Cognition  Cognition Arousal/Alertness: Awake/alert Behavior During Therapy: WFL for tasks assessed/performed Overall Cognitive Status: Within  Functional Limits for tasks assessed    Extremity/Trunk Assessment  Upper Extremity Assessment Upper Extremity Assessment: Overall WFL for tasks assessed Lower Extremity Assessment Lower Extremity Assessment: Defer to PT evaluation Cervical / Trunk Assessment Cervical / Trunk Assessment: Normal     Mobility Bed Mobility Bed Mobility: Not assessed Supine to Sit: 4: Min assist;HOB elevated;With rails Sitting - Scoot to Edge of Bed: 4: Min guard;With rail Details for Bed Mobility Assistance: Pt was cued for sequencing and safety awareness with hip precautions Transfers Transfers: Sit to Stand;Stand to Sit Sit to Stand: 5: Supervision;With upper extremity assist Stand to Sit: 5: Supervision;With upper extremity assist Details for Transfer Assistance: cues to extend Lt. leg when moving sit to stand     Exercise Total Joint Exercises Quad Sets: 10 reps;Both Heel Slides: 10 reps;Left Hip ABduction/ADduction: 10 reps;Left Long Arc Quad: 10 reps;Left   Balance     End of Session OT - End of Session Equipment Utilized During Treatment: Rolling walker Activity Tolerance: Patient tolerated treatment well Patient left: in chair;with call bell/phone within reach;with family/visitor present Nurse Communication: Mobility status  GO     Kari [name], Ursula Alert M 02/05/2013, 12:14 PM

## 2013-02-05 NOTE — Discharge Summary (Signed)
Patient ID: MALY LEMARR MRN: [id number] DOB/AGE: 08-Apr-1951 62 y.o.  Admit date: 02/03/2013 Discharge date: 02/05/2013  Admission Diagnoses:  Principal Problem:   Arthritis, hip   Discharge Diagnoses:  Same  Past Medical History  Diagnosis Date  . Hypertension   . Arthritis   . Osteoporosis   . GERD (gastroesophageal reflux disease)   . Hypothyroidism   . Fibromyalgia   . Complication of anesthesia     when block attempted ax -turned red in preop-surg delayed  . MVP (mitral valve prolapse)   . Rheumatoid aortitis     ? arthritis    Surgeries: Procedure(s): TOTAL HIP ARTHROPLASTY on 02/03/2013   Consultants:    Discharged Condition: Improved  Hospital Course: SENAYA DICENSO is an 62 y.o. female who was admitted 02/03/2013 for operative treatment ofArthritis, hip. Patient has severe unremitting pain that affects sleep, daily activities, and work/hobbies. After pre-op clearance the patient was taken to the operating room on 02/03/2013 and underwent  Procedure(s): TOTAL HIP ARTHROPLASTY.    Patient was given perioperative antibiotics: Anti-infectives   Start     Dose/Rate Route Frequency Ordered Stop   02/03/13 1032  clindamycin (CLEOCIN) 600 MG/50ML IVPB  Status:  Discontinued    Comments:  GALLMAN, KATHIE JEAN: cabinet override      02/03/13 1032 02/03/13 1034   02/03/13 0600  clindamycin (CLEOCIN) IVPB 900 mg     900 mg 100 mL/hr over 30 Minutes Intravenous On call to O.R. 02/02/13 1448 02/03/13 1215       Patient was given sequential compression devices, early ambulation, and chemoprophylaxis to prevent DVT.  Patient benefited maximally from hospital stay and there were no complications.    Recent vital signs: Patient Vitals for the past 24 hrs:  BP Temp Temp src Pulse Resp SpO2  02/05/13 0449 120/50 mmHg 99.2 F (37.3 C) - 81 16 94 %  02/05/13 0045 - 99.2 F (37.3 C) - - - -  02/05/13 0026 - 100.2 F (37.9 C) - - - -  02/04/13 2300 - 101.1 F (38.4 C) Oral - - -   02/04/13 2127 125/53 mmHg 101.2 F (38.4 C) - 85 16 93 %  02/04/13 1750 109/45 mmHg 99.3 F (37.4 C) Oral 79 18 93 %  02/04/13 1600 - - - - 20 -  02/04/13 1200 - - - - 18 -  02/04/13 1032 107/45 mmHg 98.2 F (36.8 C) Oral 72 16 96 %  02/04/13 0800 - - - - 18 -     Recent laboratory studies:  Recent Labs  02/04/13 0500 02/05/13 0455  WBC 13.6* 10.5  HGB 10.3* 9.4*  HCT 27.9* 26.3*  PLT 214 183  NA 137  --   K 3.5  --   CL 101  --   CO2 25  --   BUN 11  --   CREATININE 0.59  --   GLUCOSE 148*  --   CALCIUM 8.2*  --      Discharge Medications:     Medication List    STOP taking these medications       HYDROcodone-acetaminophen 5-325 MG per tablet  Commonly known as:  NORCO/VICODIN      TAKE these medications       aspirin EC 325 MG tablet  Take 1 tablet (325 mg total) by mouth 2 (two) times daily.     atenolol 50 MG tablet  Commonly known as:  TENORMIN  Take 50 mg by mouth daily.  CALTRATE 600 PLUS-VIT D PO  Take 1 tablet by mouth 2 (two) times daily.     cholecalciferol 1000 UNITS tablet  Commonly known as:  VITAMIN D  Take 1,000 Units by mouth daily.     etanercept 50 MG/ML injection  Commonly known as:  ENBREL  Inject 50 mg into the skin once a week.     FISH OIL PO  Take 1 capsule by mouth daily.     fluticasone 50 MCG/ACT nasal spray  Commonly known as:  FLONASE  Place 1 spray into the nose daily.     folic acid 800 MCG tablet  Commonly known as:  FOLVITE  Take 800 mcg by mouth daily.     gabapentin 300 MG capsule  Commonly known as:  NEURONTIN  Take 300 mg by mouth 5 (five) times daily.     hydrochlorothiazide 25 MG tablet  Commonly known as:  HYDRODIURIL  Take 25 mg by mouth daily.     levothyroxine 50 MCG tablet  Commonly known as:  SYNTHROID, LEVOTHROID  Take 50 mcg by mouth daily.     methocarbamol 500 MG tablet  Commonly known as:  ROBAXIN  Take 1 tablet (500 mg total) by mouth 2 (two) times daily with a meal.      methotrexate 2.5 MG tablet  Commonly known as:  RHEUMATREX  Take 20 mg by mouth once a week. Takes 8 (2.5mg ) tablets weekly on mondays     multivitamin with minerals Tabs tablet  Take 1 tablet by mouth daily.     oxyCODONE-acetaminophen 5-325 MG per tablet  Commonly known as:  ROXICET  Take 1 tablet by mouth every 4 (four) hours as needed for pain.     pantoprazole 40 MG tablet  Commonly known as:  PROTONIX  Take 40 mg by mouth 2 (two) times daily.     POTASSIUM PO  Take 1 tablet by mouth daily.     simvastatin 20 MG tablet  Commonly known as:  ZOCOR  Take 20 mg by mouth every evening.        Diagnostic Studies: Dg Chest 2 View  01/26/2013   *RADIOLOGY REPORT*  Clinical Data: Preop admission for left hip surgery  CHEST - 2 VIEW  Comparison: 02/12/2012  Findings: Cardiomediastinal silhouette is stable.  No acute infiltrate or pleural effusion.  No pulmonary edema.  Mild degenerative changes thoracic spine again noted.  IMPRESSION: No active disease.  No significant change.   Original Report Authenticated By: Natasha Mead, M.D.   Dg Pelvis Portable  02/03/2013   *RADIOLOGY REPORT*  Clinical Data: Left hip replacement, postoperative  PORTABLE PELVIS  Comparison: MRI 10/15/2012  Findings: Surgical changes of left total hip arthroplasty without evidence of immediate hardware complication.  The femoral head component appears located within the acetabular component. Expected subcutaneous emphysema in the soft tissues of the left hip.  Moderate right hip osteoarthritis noted incidentally.  IMPRESSION:  Left hip arthroplasty without evidence of immediate hardware complication.   Original Report Authenticated By: Malachy Moan, M.D.    Disposition: 01-Home or Self Care      Discharge Orders   Future Orders Complete By Expires     Call MD / Call 911  As directed     Comments:      If you experience chest pain or shortness of breath, CALL 911 and be transported to the hospital emergency  room.  If you develope a fever above 101 F, pus (white drainage) or increased drainage  or redness at the wound, or calf pain, call your surgeon's office.    Change dressing  As directed     Comments:      You may change your dressing on day 5, then change the dressing daily with sterile 4 x 4 inch gauze dressing and paper tape.  You may clean the incision with alcohol prior to redressing    Constipation Prevention  As directed     Comments:      Drink plenty of fluids.  Prune juice may be helpful.  You may use a stool softener, such as Colace (over the counter) 100 mg twice a day.  Use MiraLax (over the counter) for constipation as needed.    Diet - low sodium heart healthy  As directed     Discharge instructions  As directed     Comments:      Follow up in office with Dr. Turner Daniels in 2 weeks.    Driving restrictions  As directed     Comments:      No driving for 2 weeks    Follow the hip precautions as taught in Physical Therapy  As directed     Increase activity slowly as tolerated  As directed     Patient may shower  As directed     Comments:      You may shower without a dressing once there is no drainage.  Do not wash over the wound.  If drainage remains, cover wound with plastic wrap and then shower.       Follow-up Information   Follow up with Nestor Lewandowsky, MD In 2 weeks.   Contact information:   [address] [place] [address] [phone]        Signed: Vear Clock Ramondo Dietze R 02/05/2013, 7:38 AM

## 2013-02-05 NOTE — Progress Notes (Signed)
Physical Therapy Treatment Patient Details Name: Kari [name] MRN: [id number] DOB: Dec 31, 1950 Today's Date: 02/05/2013 Time: 4696-2952 PT Time Calculation (min): 32 min  PT Assessment / Plan / Recommendation  History of Present Illness Pt is a 62 y/o female s/p L THA on 02/04/2013   PT Comments   Patient able to complete stair training. Anticipate DC later today  Follow Up Recommendations  Home health PT     Does the patient have the potential to tolerate intense rehabilitation     Barriers to Discharge        Equipment Recommendations  None recommended by PT    Recommendations for Other Services    Frequency 7X/week   Progress towards PT Goals Progress towards PT goals: Progressing toward goals  Plan Current plan remains appropriate    Precautions / Restrictions Precautions Precautions: Posterior Hip;Fall Restrictions LLE Weight Bearing: Weight bearing as tolerated   Pertinent Vitals/Pain no apparent distress     Mobility  Bed Mobility Bed Mobility: Not assessed Supine to Sit: 4: Min assist;HOB elevated;With rails Sitting - Scoot to Edge of Bed: 4: Min guard;With rail Details for Bed Mobility Assistance: Pt was cued for sequencing and safety awareness with hip precautions Transfers Sit to Stand: 5: Supervision;With upper extremity assist;From chair/3-in-1 Stand to Sit: 5: Supervision;With upper extremity assist;To chair/3-in-1 Details for Transfer Assistance: Pt was cued for hand placement on seated surface and walker placement in front of pt before reaching back for UE support Ambulation/Gait Ambulation/Gait Assistance: 4: Min guard Ambulation Distance (Feet): 120 Feet Assistive device: Rolling walker Ambulation/Gait Assistance Details: Patient able to walk with step through gait pattern Gait Pattern: Step-through pattern Stairs: Yes Stairs Assistance: 4: Min guard Stair Management Technique: Step to pattern;Forwards;Two rails Number of Stairs: 3    Exercises  Total Joint Exercises Quad Sets: 10 reps;Both Heel Slides: 10 reps;Left Hip ABduction/ADduction: 10 reps;Left Long Arc Quad: 10 reps;Left   PT Diagnosis:    PT Problem List:   PT Treatment Interventions:     PT Goals (current goals can now be found in the care plan section)    Visit Information  Last PT Received On: 02/05/13 Assistance Needed: +1 History of Present Illness: Pt is a 62 y/o female s/p L THA on 02/04/2013    Subjective Data      Cognition  Cognition Arousal/Alertness: Awake/alert Behavior During Therapy: WFL for tasks assessed/performed Overall Cognitive Status: Within Functional Limits for tasks assessed    Balance     End of Session PT - End of Session Equipment Utilized During Treatment: Gait belt Activity Tolerance: Patient tolerated treatment well Patient left: in chair;with call bell/phone within reach Nurse Communication: Patient requests pain meds   GP     Fredrich Birks 02/05/2013, 11:43 AM 02/05/2013 Fredrich Birks PTA [phone] pager [phone] office

## 2013-02-05 NOTE — Progress Notes (Signed)
Physical Therapy Treatment Patient Details Name: Kari [name] MRN: [id number] DOB: 1950-11-20 Today's Date: 02/05/2013 Time: 6387-5643 PT Time Calculation (min): 24 min  PT Assessment / Plan / Recommendation  History of Present Illness Pt is a 62 y/o female s/p L THA on 02/04/2013   PT Comments   Patient progressing well. Will attempt steps later today  Follow Up Recommendations  Home health PT     Does the patient have the potential to tolerate intense rehabilitation     Barriers to Discharge        Equipment Recommendations  None recommended by PT    Recommendations for Other Services    Frequency 7X/week   Progress towards PT Goals Progress towards PT goals: Progressing toward goals  Plan Current plan remains appropriate    Precautions / Restrictions Precautions Precautions: Posterior Hip;Fall Restrictions LLE Weight Bearing: Weight bearing as tolerated   Pertinent Vitals/Pain no apparent distress     Mobility  Bed Mobility Supine to Sit: 4: Min assist;HOB elevated;With rails Sitting - Scoot to Edge of Bed: 4: Min guard;With rail Details for Bed Mobility Assistance: Pt was cued for sequencing and safety awareness with hip precautions Transfers Sit to Stand: 4: Min guard;With upper extremity assist;From chair/3-in-1 Stand to Sit: 4: Min guard;To chair/3-in-1;With upper extremity assist Details for Transfer Assistance: Pt was cued for hand placement on seated surface and walker placement in front of pt before reaching back for UE support Ambulation/Gait Ambulation/Gait Assistance: 4: Min guard Ambulation Distance (Feet): 120 Feet Assistive device: Rolling walker Ambulation/Gait Assistance Details: Patient able to walk with step through gait pattern Gait Pattern: Step-through pattern;Decreased stride length    Exercises Total Joint Exercises Quad Sets: 10 reps;Both Heel Slides: 10 reps;Left Hip ABduction/ADduction: 10 reps;Left Long Arc Quad: 10 reps;Left   PT  Diagnosis:    PT Problem List:   PT Treatment Interventions:     PT Goals (current goals can now be found in the care plan section)    Visit Information  Last PT Received On: 02/05/13 Assistance Needed: +1 History of Present Illness: Pt is a 62 y/o female s/p L THA on 02/04/2013    Subjective Data      Cognition  Cognition Arousal/Alertness: Awake/alert Behavior During Therapy: WFL for tasks assessed/performed Overall Cognitive Status: Within Functional Limits for tasks assessed    Balance     End of Session PT - End of Session Equipment Utilized During Treatment: Gait belt Activity Tolerance: Patient tolerated treatment well Patient left: in chair;with call bell/phone within reach Nurse Communication: Patient requests pain meds   GP     Fredrich Birks 02/05/2013, 9:36 AM 02/05/2013 Fredrich Birks PTA [phone] pager [phone] office

## 2013-02-05 NOTE — Progress Notes (Signed)
PATIENT ID: Kari [name]  MRN: [id number]  DOB/AGE:  11/12/1950 / 62 y.o.  2 Days Post-Op Procedure(s) (LRB): TOTAL HIP ARTHROPLASTY (Left)    PROGRESS NOTE Subjective: Patient is alert, oriented,no Nausea, no Vomiting, yes passing gas, no Bowel Movement. Taking PO well. Denies SOB, Chest or Calf Pain. Using Incentive Spirometer, PAS in place. Ambulate WBAT, pt walked 100 feet with pt yesterday. Patient reports pain as mild  .    Objective: Vital signs in last 24 hours: Filed Vitals:   02/04/13 2300 02/05/13 0026 02/05/13 0045 02/05/13 0449  BP:    120/50  Pulse:    81  Temp: 101.1 F (38.4 C) 100.2 F (37.9 C) 99.2 F (37.3 C) 99.2 F (37.3 C)  TempSrc: Oral     Resp:    16  SpO2:    94%      Intake/Output from previous day: I/O last 3 completed shifts: In: 1586.3 [P.O.:940; I.V.:646.3] Out: 451 [Urine:451]   Intake/Output this shift:     LABORATORY DATA:  Recent Labs  02/04/13 0500 02/05/13 0455  WBC 13.6* 10.5  HGB 10.3* 9.4*  HCT 27.9* 26.3*  PLT 214 183  NA 137  --   K 3.5  --   CL 101  --   CO2 25  --   BUN 11  --   CREATININE 0.59  --   GLUCOSE 148*  --   CALCIUM 8.2*  --     Examination: Neurologically intact ABD soft Neurovascular intact Sensation intact distally Intact pulses distally Dorsiflexion/Plantar flexion intact Incision: no drainage No cellulitis present Compartment soft} XR AP&Lat of hip shows well placed\fixed THA  Assessment:   2 Days Post-Op Procedure(s) (LRB): TOTAL HIP ARTHROPLASTY (Left) ADDITIONAL DIAGNOSIS:  Hypertension  Plan: PT/OT WBAT, THA  posterior precautions  DVT Prophylaxis: SCDx72 hrs, ASA 325 mg BID x 2 weeks  DISCHARGE PLAN: Home when pt passes PT hopefully today.  DISCHARGE NEEDS: HHPT, HHRN, Walker and 3-in-1 comode seat

## 2013-06-17 ENCOUNTER — Ambulatory Visit (HOSPITAL_COMMUNITY)
Admission: RE | Admit: 2013-06-17 | Discharge: 2013-06-17 | Disposition: A | Discharge status: Home / Self Care | Payer: BC Managed Care – PPO | Source: Ambulatory Visit | Attending: Rheumatology | Admitting: Rheumatology

## 2013-06-17 ENCOUNTER — Other Ambulatory Visit (HOSPITAL_COMMUNITY): Payer: Self-pay | Admitting: Rheumatology

## 2013-06-17 DIAGNOSIS — M25561 Pain in right knee: Secondary | ICD-10-CM

## 2013-06-17 DIAGNOSIS — M25569 Pain in unspecified knee: Secondary | ICD-10-CM | POA: Insufficient documentation

## 2013-06-17 NOTE — Progress Notes (Signed)
*  PRELIMINARY RESULTS* Vascular Ultrasound Right lower extremity venous duplex has been completed.  Preliminary findings: no evidence of DVT. There is a small homogenous area in the mid calf. This is possibly a muscle tear.  Results called to Dr. Fatima Sanger office.   Farrel Demark, RDMS, RVT  06/17/2013, 12:01 PM

## 2013-07-09 ENCOUNTER — Other Ambulatory Visit: Payer: Self-pay | Admitting: Dermatology

## 2013-07-12 ENCOUNTER — Other Ambulatory Visit: Payer: Self-pay | Admitting: Obstetrics and Gynecology

## 2013-10-21 ENCOUNTER — Ambulatory Visit (INDEPENDENT_AMBULATORY_CARE_PROVIDER_SITE_OTHER): Payer: BC Managed Care – PPO | Admitting: Cardiology

## 2013-10-21 ENCOUNTER — Encounter (INDEPENDENT_AMBULATORY_CARE_PROVIDER_SITE_OTHER): Payer: Self-pay

## 2013-10-21 ENCOUNTER — Encounter: Payer: Self-pay | Admitting: Cardiology

## 2013-10-21 VITALS — BP 126/80 | HR 67 | Ht 63.0 in | Wt 189.4 lb

## 2013-10-21 DIAGNOSIS — I4949 Other premature depolarization: Secondary | ICD-10-CM | POA: Diagnosis not present

## 2013-10-21 DIAGNOSIS — I341 Nonrheumatic mitral (valve) prolapse: Secondary | ICD-10-CM

## 2013-10-21 DIAGNOSIS — R002 Palpitations: Secondary | ICD-10-CM

## 2013-10-21 DIAGNOSIS — R0789 Other chest pain: Secondary | ICD-10-CM

## 2013-10-21 DIAGNOSIS — I493 Ventricular premature depolarization: Secondary | ICD-10-CM

## 2013-10-21 DIAGNOSIS — I059 Rheumatic mitral valve disease, unspecified: Secondary | ICD-10-CM

## 2013-10-21 MED ORDER — SPIRONOLACTONE 25 MG PO TABS: 12.5000 mg | ORAL_TABLET | Freq: Every day | ORAL | Status: DC

## 2013-10-21 NOTE — Patient Instructions (Addendum)
Your physician has recommended you make the following change in your medication:  1. Stop HCTZ 2, STOP Potassium 3. Start Spirolactone 25 mg take 1/2 tab daily Your physician recommends that you schedule a follow-up appointment in: 1 month with Dr. Marlou Porch  Your physician recommends that you return for lab work for a  BMET  same day as 1 month appt

## 2013-10-21 NOTE — Progress Notes (Signed)
[facility]. [address], [address], [place]  [address] Phone: [phone] Fax:  [phone]  Date:  10/21/2013   ID:  Kari [name], DOB 02/26/1951, MRN [id number]  PCP:  Milagros Evener, MD   History of Present Illness: Kari [name] is a 63 y.o. female here for followup visit with history of hypertension, fibromyalgia, RA now with heart palpitations. I reviewed Dr. Eddie Dibbles note 10/07/13 where she was complaining of 2 weeks of palpitations, skipped beats sometimes like prolonged rapid heart rate and may be associated lightheadedness. GERD. Occurring at rest. No syncope, no chest pain. She had mild mitral regurgitation on prior echocardiogram. She was drinking 3 caffeinated sodas daily. Now on 2 sodas. No decongestants. Normal TSH in July of 2014. EKG was performed at his office and demonstrated PVCs, unifocal.  On 01/20/13 she underwent nuclear stress test that was low risk but no ischemia, normal ejection fraction.  On 01/20/13 she underwent echocardiogram: 1. Normal LV size and function.  2. Left ventricular ejection fraction estimated by 2D at 60-65 percent.  3. There were no regional wall motion abnormalities.  4. Mild mitral valve regurgitation.  5. There is mild tricuspid regurgitation.  6. Upper normal estimated right ventricular systolic pressure.  7. Mild calcification of the aortic valve.  8. No aortic valve stenosis.  9. No aortic valve regurgitation.  10. Analysis of mitral valve inflow, pulmonary vein Doppler and tissue Doppler suggests grade I diastolic dysfunction without elevated left atrial pressure.   year-old here for preoperative risk stratification prior to left hip replacement by Dr. Mayer Camel scheduled on 02/03/13. She has not had any prior cardiovascular issues but she has had occasional palpitations on laying down at night but feels as though this is anxiety related. Her father had his first heart attack in his 80s. She has had chronic dyspnea on  exertion and has had pulmonary function studies in the past. She believes that this is related to her Enbrel. Dx years ago in 1990 with MVP. Her dyspnea can be mild to moderate in severity and relieved with rest with no other associated symptoms such as chest pain. Can feel light headed with stress. She was quite upset when her physician stated that she should see a cardiologist for check and clearance given her palpitations and shortness of breath prior to her surgery. An EKG demonstrates sinus bradycardia rate 53 with left anterior fascicular block as well as nonspecific ST-T wave changes.    Wt Readings from Last 3 Encounters:  10/21/13 189 lb 6.4 oz (85.911 kg)  01/26/13 187 lb 9.8 oz (85.1 kg)  09/12/11 178 lb (80.74 kg)     Past Medical History  Diagnosis Date  . Hypertension   . Arthritis   . Osteoporosis   . GERD (gastroesophageal reflux disease)   . Hypothyroidism   . Fibromyalgia   . Complication of anesthesia     when block attempted ax -turned red in preop-surg delayed  . MVP (mitral valve prolapse)   . Rheumatoid aortitis     ? arthritis    Past Surgical History  Procedure Laterality Date  . Knee arthroscopy  2/11    right  . Wrist arthroplasty      rt x2-fusion   arthroscopy not athroplasty  . Wrist arthroscopy      lt  . Cholecystectomy    . Abdominal hysterectomy    . Cesarean section    . Colonoscopy    . Ulnar nerve transposition  09/17/2011    Procedure: ULNAR NERVE DECOMPRESSION/TRANSPOSITION;  Surgeon: Cammie Sickle., MD;  Location: [facility];  Service: Orthopedics;  Laterality: Left;  decompression  . Total hip arthroplasty Left 02/03/2013    Procedure: TOTAL HIP ARTHROPLASTY;  Surgeon: Kerin Salen, MD;  Location: [facility];  Service: Orthopedics;  Laterality: Left;    Current Outpatient Prescriptions  Medication Sig Dispense Refill  . atenolol (TENORMIN) 50 MG tablet Take 50 mg by mouth daily.      . Calcium-Vitamin D (CALTRATE 600  PLUS-VIT D PO) Take 1 tablet by mouth 2 (two) times daily.      . cholecalciferol (VITAMIN D) 1000 UNITS tablet Take 1,000 Units by mouth daily.      Marland Kitchen etanercept (ENBREL) 50 MG/ML injection Inject 50 mg into the skin once a week.      . fluticasone (FLONASE) 50 MCG/ACT nasal spray Place 1 spray into the nose daily.      . folic acid (FOLVITE) 053 MCG tablet Take 800 mcg by mouth 2 (two) times daily.       Marland Kitchen gabapentin (NEURONTIN) 300 MG capsule Take 300 mg by mouth 5 (five) times daily.       . hydrochlorothiazide (HYDRODIURIL) 25 MG tablet Take 25 mg by mouth daily.      Marland Kitchen levothyroxine (SYNTHROID, LEVOTHROID) 50 MCG tablet Take 50 mcg by mouth daily.      . methocarbamol (ROBAXIN) 500 MG tablet Take 1 tablet (500 mg total) by mouth 2 (two) times daily with a meal.  1 tablet  0  . methotrexate (RHEUMATREX) 2.5 MG tablet Take 20 mg by mouth once a week. Takes 8 (2.5mg ) tablets weekly on mondays      . Multiple Vitamin (MULTIVITAMIN WITH MINERALS) TABS Take 1 tablet by mouth daily.      . Omega-3 Fatty Acids (FISH OIL PO) Take 1 capsule by mouth daily.      . pantoprazole (PROTONIX) 40 MG tablet Take 40 mg by mouth 2 (two) times daily.       Marland Kitchen POTASSIUM PO Take 1 tablet by mouth daily.      . simvastatin (ZOCOR) 20 MG tablet Take 20 mg by mouth every evening.       No current facility-administered medications for this visit.    Allergies:    Allergies  Allergen Reactions  . Clinoril [Sulindac] Nausea And Vomiting  . Erythromycin Nausea And Vomiting  . Hydrocodone Nausea Only    "in high doses stomach upset" ok with low doses.   . Penicillins Hives  . Sulfa Antibiotics Nausea And Vomiting  . Trimethoprim Nausea And Vomiting  . Vancomycin Cross Reactors     Flushing-redmans reaction    Social History:  The patient  reports that she has never smoked. She does not have any smokeless tobacco history on file. She reports that she does not drink alcohol or use illicit drugs.   No family  history on file.  ROS:  Please see the history of present illness.   Knee pain since hurting it in water aerobics in pool, arthritis pain, palpitations, heartburn, chest pain. Denies any bleeding, no syncope  All other systems reviewed and negative.   PHYSICAL EXAM: VS:  BP 126/80  Pulse 67  Ht 5\' 3"  (1.6 m)  Wt 189 lb 6.4 oz (85.911 kg)  BMI 33.56 kg/m2  SpO2 93% Well nourished, well developed, in no acute distress HEENT: normal, [place]/AT, EOMI Neck: no JVD, normal carotid  upstroke, no bruit Cardiac:  normal S1, S2; RRR; no murmur Lungs:  clear to auscultation bilaterally, no wheezing, rhonchi or rales Abd: soft, nontender, no hepatomegaly, no bruitsOverweight Ext: no edema, 2+ distal pulses Skin: warm and dry GU: deferred Neuro: no focal abnormalities noted, AAO x 3  EKG: Sinus rhythm with PVC Lab work reviewed, potassium 3.1 a normal creatinine    ASSESSMENT AND PLAN:  1. Palpitations-likely secondary to PVCs. No high-risk symptoms such as associated syncope. She may feel some lightheadedness but I am not convinced that this is secondary to her PVCs. Continue with atenolol. Her hypokalemia may be contributing as well. I'm going to stop her HCTZ and start her on spironolactone 12.5 mg once a day. I will check a basic metabolic profile in one month and see her back in one month. I will stop her potassium supplementation which she admits is challenging for her to take. 2. PVCs-encouraged her to stop sodas altogether. Exercise. We will increase potassium. 3. Hypokalemia-as above 4. Hypertension-overall reasonable control 5. Mitral regurgitation-mild. No significant prolapse seen on last echocardiogram. 6. Chest pain-atypical, possibly musculoskeletal/GERD related. Recent nuclear stress test as above, low risk. 7. We will see back in one month.  Signed, Candee Furbish, MD [facility]  10/21/2013 4:23 PM

## 2013-10-28 ENCOUNTER — Ambulatory Visit: Payer: BC Managed Care – PPO | Admitting: Cardiology

## 2013-10-28 ENCOUNTER — Telehealth: Payer: Self-pay | Admitting: Cardiology

## 2013-10-28 NOTE — Telephone Encounter (Signed)
Pt states she having palpitations that had been " driving me crazy from Saturday to Wednesday" Today states she feels more like herself. Denies any palpitations Aware her appointment is 11/12/13 with Dr. Marlou Porch.  States she has been avoiding caffeine, decreased decaf beverages, & gave away her hidden chocolate stash.  Husband initially called & was quite concerned asked me to call pt.  Pt states her husband has been a "worry wart" & she is fine with upcoming appt. Date Horton Chin RN

## 2013-10-28 NOTE — Telephone Encounter (Signed)
New Message:  Pt's husband is c/o his wife having chest pain. States she has had chest pain all week long. States she is still having CP.

## 2013-11-01 ENCOUNTER — Ambulatory Visit: Payer: BC Managed Care – PPO | Admitting: Cardiology

## 2013-11-11 ENCOUNTER — Other Ambulatory Visit: Payer: Self-pay | Admitting: Dermatology

## 2013-11-12 ENCOUNTER — Ambulatory Visit (INDEPENDENT_AMBULATORY_CARE_PROVIDER_SITE_OTHER): Payer: BC Managed Care – PPO | Admitting: Cardiology

## 2013-11-12 ENCOUNTER — Other Ambulatory Visit: Payer: BC Managed Care – PPO

## 2013-11-12 ENCOUNTER — Encounter: Payer: Self-pay | Admitting: Cardiology

## 2013-11-12 VITALS — BP 178/90 | HR 60 | Ht 63.0 in | Wt 189.1 lb

## 2013-11-12 DIAGNOSIS — I341 Nonrheumatic mitral (valve) prolapse: Secondary | ICD-10-CM

## 2013-11-12 DIAGNOSIS — I059 Rheumatic mitral valve disease, unspecified: Secondary | ICD-10-CM

## 2013-11-12 NOTE — Patient Instructions (Signed)
Will obtain labs today and call you with the results (BMET)  Your physician recommends that you continue on your current medications as directed. Please refer to the Current Medication list given to you today.  Your physician wants you to follow-up in: [place] will receive a reminder letter in the mail two months in advance. If you don't receive a letter, please call our office to schedule the follow-up appointment.

## 2013-11-12 NOTE — Progress Notes (Signed)
[facility]. [address], [address] [place], [place]  [address] Phone: [phone] Fax:  [phone]  Date:  11/12/2013   ID:  [name], DOB 12/24/1950, MRN [id number]  PCP:  Milagros Evener, MD   History of Present Illness: Kari [name] is a 63 y.o. female here for followup visit with history of hypertension, fibromyalgia, RA, heart palpitations. I reviewed Dr. Eddie Dibbles note 10/07/13 where she was complaining of 2 weeks of palpitations, skipped beats sometimes like prolonged rapid heart rate and may be associated lightheadedness. She has felt better since I stopped her HCTZ and start her on Aldactone 12.5 mg once a day. She is stopped drinking sodas, no chocolate. No palpitations on exam.  On 01/20/13 she underwent nuclear stress test that was low risk but no ischemia, normal ejection fraction.  On 01/20/13 she underwent echocardiogram: 1. Normal LV size and function.  Her father had his first heart attack in his 38s. She has had chronic dyspnea on exertion and has had pulmonary function studies in the past. She believes that this is related to her Enbrel. Dx years ago in 1990 with MVP.   Wt Readings from Last 3 Encounters:  11/12/13 189 lb 1.9 oz (85.784 kg)  10/21/13 189 lb 6.4 oz (85.911 kg)  01/26/13 187 lb 9.8 oz (85.1 kg)     Past Medical History  Diagnosis Date  . Hypertension   . Arthritis   . Osteoporosis   . GERD (gastroesophageal reflux disease)   . Hypothyroidism   . Fibromyalgia   . Complication of anesthesia     when block attempted ax -turned red in preop-surg delayed  . MVP (mitral valve prolapse)   . Rheumatoid aortitis     ? arthritis    Past Surgical History  Procedure Laterality Date  . Knee arthroscopy  2/11    right  . Wrist arthroplasty      rt x2-fusion   arthroscopy not athroplasty  . Wrist arthroscopy      lt  . Cholecystectomy    . Abdominal hysterectomy    . Cesarean section    . Colonoscopy    . Ulnar nerve transposition   09/17/2011    Procedure: ULNAR NERVE DECOMPRESSION/TRANSPOSITION;  Surgeon: Cammie Sickle., MD;  Location: [facility];  Service: Orthopedics;  Laterality: Left;  decompression  . Total hip arthroplasty Left 02/03/2013    Procedure: TOTAL HIP ARTHROPLASTY;  Surgeon: Kerin Salen, MD;  Location: [facility];  Service: Orthopedics;  Laterality: Left;    Current Outpatient Prescriptions  Medication Sig Dispense Refill  . atenolol (TENORMIN) 50 MG tablet Take 50 mg by mouth daily.      . Calcium-Vitamin D (CALTRATE 600 PLUS-VIT D PO) Take 1 tablet by mouth 2 (two) times daily.      . cholecalciferol (VITAMIN D) 1000 UNITS tablet Take 1,000 Units by mouth daily.      . cyclobenzaprine (FLEXERIL) 10 MG tablet       . etanercept (ENBREL) 50 MG/ML injection Inject 50 mg into the skin once a week.      . fluticasone (FLONASE) 50 MCG/ACT nasal spray Place 1 spray into the nose daily.      . folic acid (FOLVITE) 332 MCG tablet Take 800 mcg by mouth 2 (two) times daily.       Marland Kitchen gabapentin (NEURONTIN) 300 MG capsule Take 300 mg by mouth 5 (five) times daily.       Marland Kitchen  levothyroxine (SYNTHROID, LEVOTHROID) 50 MCG tablet Take 50 mcg by mouth daily.      . methotrexate (RHEUMATREX) 2.5 MG tablet Take 20 mg by mouth once a week. Takes 8 (2.5mg ) tablets weekly on mondays      . Multiple Vitamin (MULTIVITAMIN WITH MINERALS) TABS Take 1 tablet by mouth daily.      . Omega-3 Fatty Acids (FISH OIL PO) Take 1 capsule by mouth daily.      . pantoprazole (PROTONIX) 40 MG tablet Take 40 mg by mouth 2 (two) times daily.       . simvastatin (ZOCOR) 20 MG tablet Take 20 mg by mouth every evening.      Marland Kitchen spironolactone (ALDACTONE) 25 MG tablet Take 0.5 tablets (12.5 mg total) by mouth daily.  30 tablet  4   No current facility-administered medications for this visit.    Allergies:    Allergies  Allergen Reactions  . Clinoril [Sulindac] Nausea And Vomiting  . Erythromycin Nausea And Vomiting  . Hydrocodone  Nausea Only    "in high doses stomach upset" ok with low doses.   . Penicillins Hives  . Sulfa Antibiotics Nausea And Vomiting  . Trimethoprim Nausea And Vomiting  . Vancomycin Cross Reactors     Flushing-redmans reaction    Social History:  The patient  reports that she has never smoked. She does not have any smokeless tobacco history on file. She reports that she does not drink alcohol or use illicit drugs.   No family history on file.  ROS:  Please see the history of present illness.   Knee pain since hurting it in water aerobics in pool, arthritis pain, palpitations, heartburn, chest pain. Denies any bleeding, no syncope  All other systems reviewed and negative.   PHYSICAL EXAM: VS:  BP 178/90  Pulse 60  Ht 5\' 3"  (1.6 m)  Wt 189 lb 1.9 oz (85.784 kg)  BMI 33.51 kg/m2 Well nourished, well developed, in no acute distress HEENT: normal, [place]/AT, EOMI Neck: no JVD, normal carotid upstroke, no bruit Cardiac:  normal S1, S2; RRR; no murmurno ectopy Lungs:  clear to auscultation bilaterally, no wheezing, rhonchi or rales Abd: soft, nontender, no hepatomegaly, no bruitsOverweight Ext: no edema, 2+ distal pulses Skin: warm and dry GU: deferred Neuro: no focal abnormalities noted, AAO x 3  EKG: Sinus rhythm with PVC Lab work reviewed, potassium 3.1 a normal creatinine    ASSESSMENT AND PLAN:  1. Palpitations-likely secondary to PVCs. Much improved on spironolactone. I wonder if hypokalemia was playing a factor in this. I will check a basic metabolic profile now that she is on a potassium sparing diuretic. No high-risk symptoms such as associated syncope.  Continue with atenolol. Her hypokalemia may be contributing as well. 2. PVCs-Much better.  3. Hypokalemia-as above 4. Hypertension-overall reasonable control on repeat 138/74. 5. Mitral regurgitation-mild. No significant prolapse seen on last echocardiogram. 6. Chest pain-atypical, possibly musculoskeletal/GERD related. Recent  nuclear stress test as above, low risk. 7. We will see back in 6 month.  Signed, Candee Furbish, MD [facility]  11/12/2013 3:41 PM

## 2013-11-12 NOTE — Addendum Note (Signed)
Addended by: Phineas Inches D on: 11/12/2013 04:12 PM   Modules accepted: Orders

## 2013-11-13 LAB — BASIC METABOLIC PANEL
BUN: 14 mg/dL (ref 6–23)
CO2: 23 mEq/L (ref 19–32)
Calcium: 9.6 mg/dL (ref 8.4–10.5)
Chloride: 104 mEq/L (ref 96–112)
Creat: 0.74 mg/dL (ref 0.50–1.10)
Glucose, Bld: 79 mg/dL (ref 70–99)
Potassium: 4 mEq/L (ref 3.5–5.3)
Sodium: 139 mEq/L (ref 135–145)

## 2013-11-15 ENCOUNTER — Other Ambulatory Visit: Payer: Self-pay | Admitting: *Deleted

## 2013-11-15 MED ORDER — SPIRONOLACTONE 25 MG PO TABS: 12.5000 mg | ORAL_TABLET | Freq: Every day | ORAL | Status: DC

## 2013-11-18 ENCOUNTER — Ambulatory Visit: Payer: BC Managed Care – PPO | Admitting: Cardiology

## 2013-11-18 ENCOUNTER — Other Ambulatory Visit: Payer: BC Managed Care – PPO

## 2013-11-25 ENCOUNTER — Other Ambulatory Visit: Payer: Self-pay

## 2013-11-25 MED ORDER — SPIRONOLACTONE 25 MG PO TABS: 12.5000 mg | ORAL_TABLET | Freq: Every day | ORAL | Status: DC

## 2013-12-10 ENCOUNTER — Other Ambulatory Visit: Payer: Self-pay | Admitting: *Deleted

## 2013-12-10 MED ORDER — SPIRONOLACTONE 25 MG PO TABS: 12.5000 mg | ORAL_TABLET | Freq: Every day | ORAL | Status: DC

## 2013-12-21 ENCOUNTER — Encounter: Payer: Self-pay | Admitting: *Deleted

## 2014-04-15 ENCOUNTER — Other Ambulatory Visit: Payer: Self-pay

## 2014-05-11 ENCOUNTER — Ambulatory Visit (INDEPENDENT_AMBULATORY_CARE_PROVIDER_SITE_OTHER): Payer: BLUE CROSS/BLUE SHIELD | Admitting: Cardiology

## 2014-05-11 ENCOUNTER — Encounter: Payer: Self-pay | Admitting: Cardiology

## 2014-05-11 VITALS — BP 118/80 | HR 59 | Ht 63.0 in | Wt 183.0 lb

## 2014-05-11 DIAGNOSIS — I340 Nonrheumatic mitral (valve) insufficiency: Secondary | ICD-10-CM | POA: Diagnosis not present

## 2014-05-11 DIAGNOSIS — E876 Hypokalemia: Secondary | ICD-10-CM

## 2014-05-11 DIAGNOSIS — I10 Essential (primary) hypertension: Secondary | ICD-10-CM | POA: Diagnosis not present

## 2014-05-11 DIAGNOSIS — I493 Ventricular premature depolarization: Secondary | ICD-10-CM | POA: Diagnosis not present

## 2014-05-11 DIAGNOSIS — R002 Palpitations: Secondary | ICD-10-CM

## 2014-05-11 NOTE — Patient Instructions (Signed)
Your physician recommends that you continue on your current medications as directed. Please refer to the Current Medication list given to you today.  Your physician wants you to follow-up in: 1 year with Dr. Skains. You will receive a reminder letter in the mail two months in advance. If you don't receive a letter, please call our office to schedule the follow-up appointment.  
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
## 2014-06-07 ENCOUNTER — Other Ambulatory Visit: Payer: Self-pay | Admitting: Cardiology

## 2014-06-21 DIAGNOSIS — Z79899 Other long term (current) drug therapy: Secondary | ICD-10-CM | POA: Diagnosis not present

## 2014-07-13 DIAGNOSIS — Z85828 Personal history of other malignant neoplasm of skin: Secondary | ICD-10-CM | POA: Diagnosis not present

## 2014-07-13 DIAGNOSIS — D1800 Hemangioma unspecified site: Secondary | ICD-10-CM | POA: Diagnosis not present

## 2014-07-13 DIAGNOSIS — D225 Melanocytic nevi of trunk: Secondary | ICD-10-CM | POA: Diagnosis not present

## 2014-07-13 DIAGNOSIS — Z808 Family history of malignant neoplasm of other organs or systems: Secondary | ICD-10-CM | POA: Diagnosis not present

## 2014-07-13 DIAGNOSIS — D235 Other benign neoplasm of skin of trunk: Secondary | ICD-10-CM | POA: Diagnosis not present

## 2014-07-13 DIAGNOSIS — Z86018 Personal history of other benign neoplasm: Secondary | ICD-10-CM | POA: Diagnosis not present

## 2014-07-13 DIAGNOSIS — L821 Other seborrheic keratosis: Secondary | ICD-10-CM | POA: Diagnosis not present

## 2014-07-13 DIAGNOSIS — L570 Actinic keratosis: Secondary | ICD-10-CM | POA: Diagnosis not present

## 2014-07-18 DIAGNOSIS — K219 Gastro-esophageal reflux disease without esophagitis: Secondary | ICD-10-CM | POA: Diagnosis not present

## 2014-07-18 DIAGNOSIS — I10 Essential (primary) hypertension: Secondary | ICD-10-CM | POA: Diagnosis not present

## 2014-07-18 DIAGNOSIS — E039 Hypothyroidism, unspecified: Secondary | ICD-10-CM | POA: Diagnosis not present

## 2014-07-18 DIAGNOSIS — E785 Hyperlipidemia, unspecified: Secondary | ICD-10-CM | POA: Diagnosis not present

## 2014-07-28 DIAGNOSIS — H2511 Age-related nuclear cataract, right eye: Secondary | ICD-10-CM | POA: Diagnosis not present

## 2014-07-28 DIAGNOSIS — H02839 Dermatochalasis of unspecified eye, unspecified eyelid: Secondary | ICD-10-CM | POA: Diagnosis not present

## 2014-07-28 DIAGNOSIS — H43812 Vitreous degeneration, left eye: Secondary | ICD-10-CM | POA: Diagnosis not present

## 2014-07-28 DIAGNOSIS — H25011 Cortical age-related cataract, right eye: Secondary | ICD-10-CM | POA: Diagnosis not present

## 2014-08-08 DIAGNOSIS — Z1231 Encounter for screening mammogram for malignant neoplasm of breast: Secondary | ICD-10-CM | POA: Diagnosis not present

## 2014-08-30 DIAGNOSIS — L309 Dermatitis, unspecified: Secondary | ICD-10-CM | POA: Diagnosis not present

## 2014-09-03 IMAGING — CR DG CHEST 2V
2 series · 2 of 2 positions shown · non-contrast
Comparison: 02/12/2012

CLINICAL DATA: Preop admission for left hip surgery

CHEST - 2 VIEW

[w chest pa]
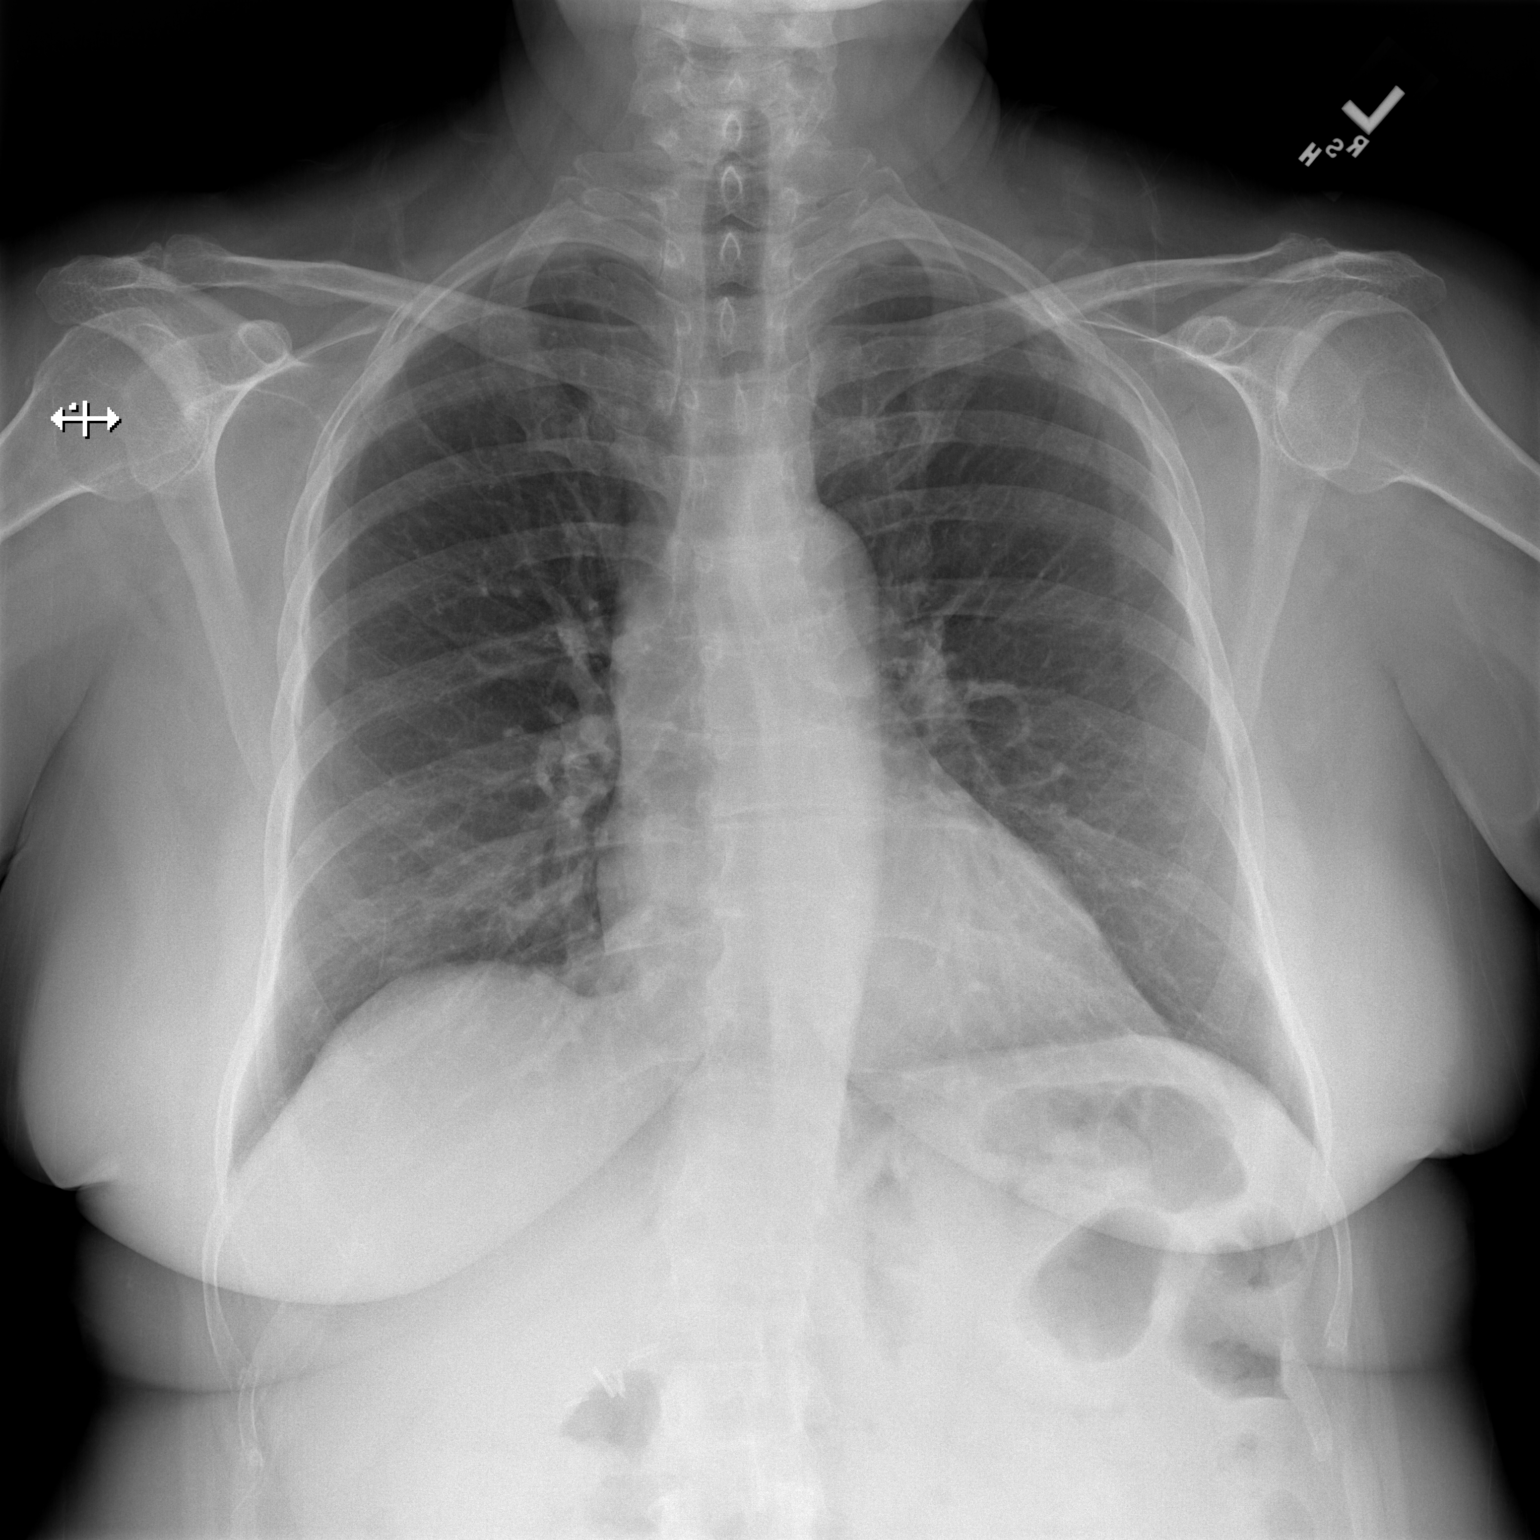

[w chest lat]
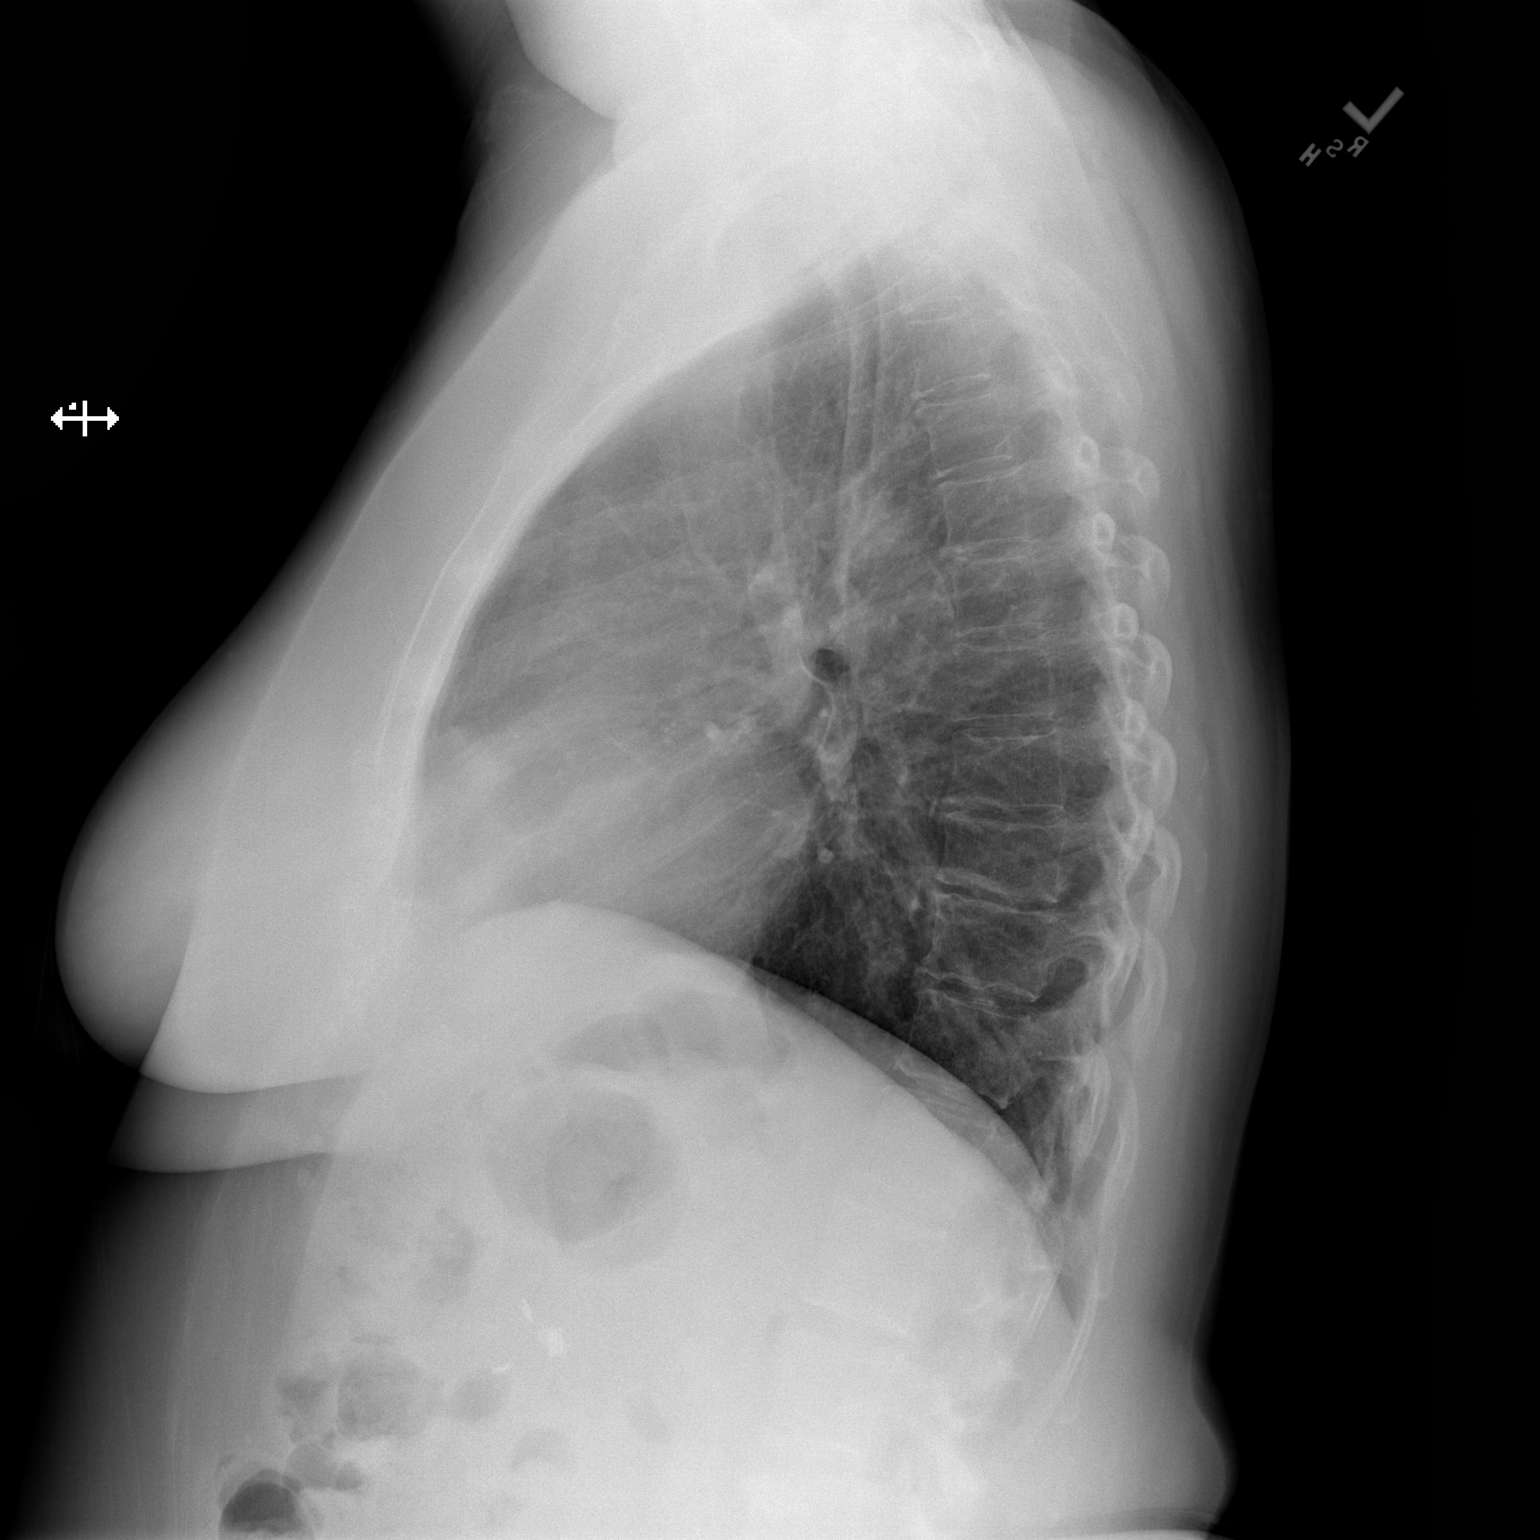

[2 of 2 positions shown; findings below may reference images not displayed]

FINDINGS: Cardiomediastinal silhouette is stable.  No acute
infiltrate or pleural effusion.  No pulmonary edema.  Mild
degenerative changes thoracic spine again noted.
IMPRESSION: No active disease.  No significant change.

## 2014-09-11 IMAGING — CR DG PORTABLE PELVIS
2 series · 2 of 2 positions shown · non-contrast
Comparison: MRI 10/15/2012

CLINICAL DATA: Left hip replacement, postoperative

PORTABLE PELVIS

[AP (1 of 2)]
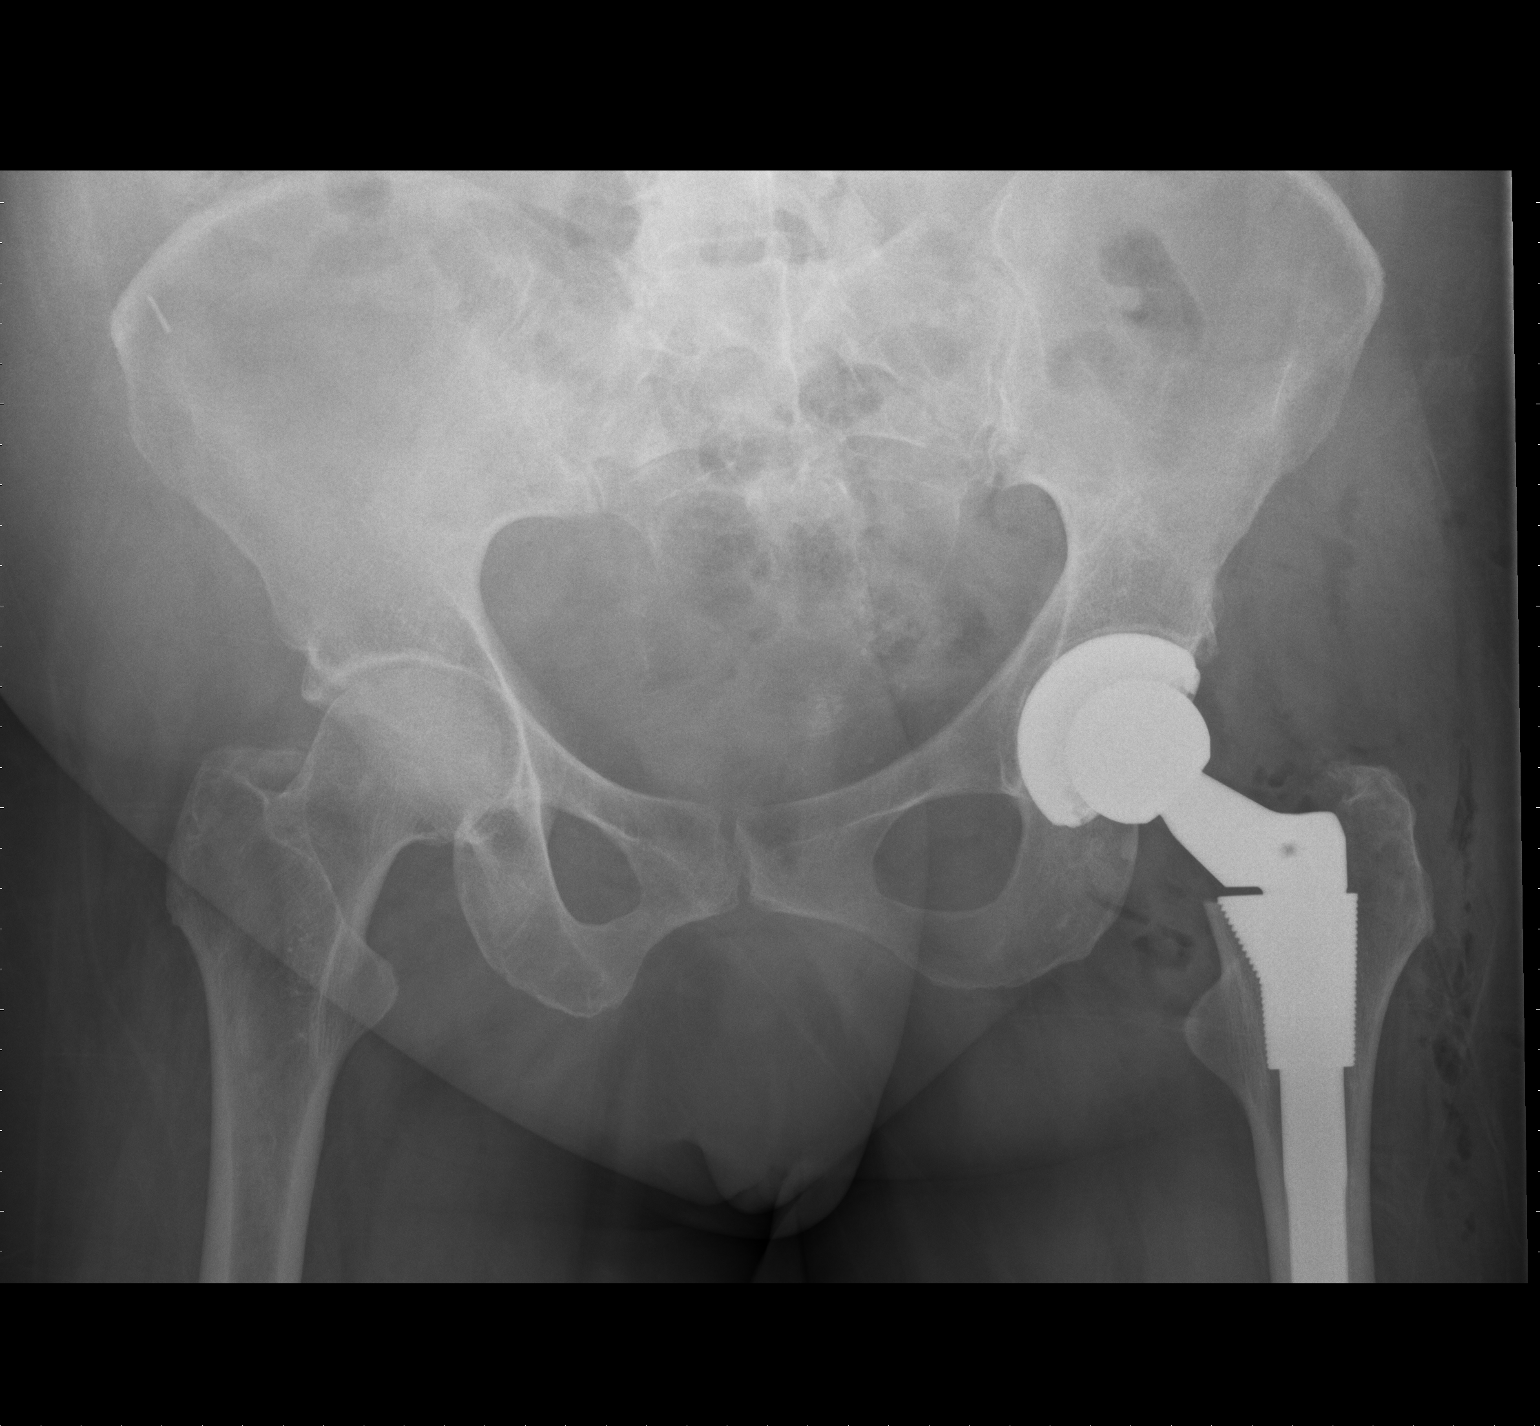

[AP (2 of 2)]
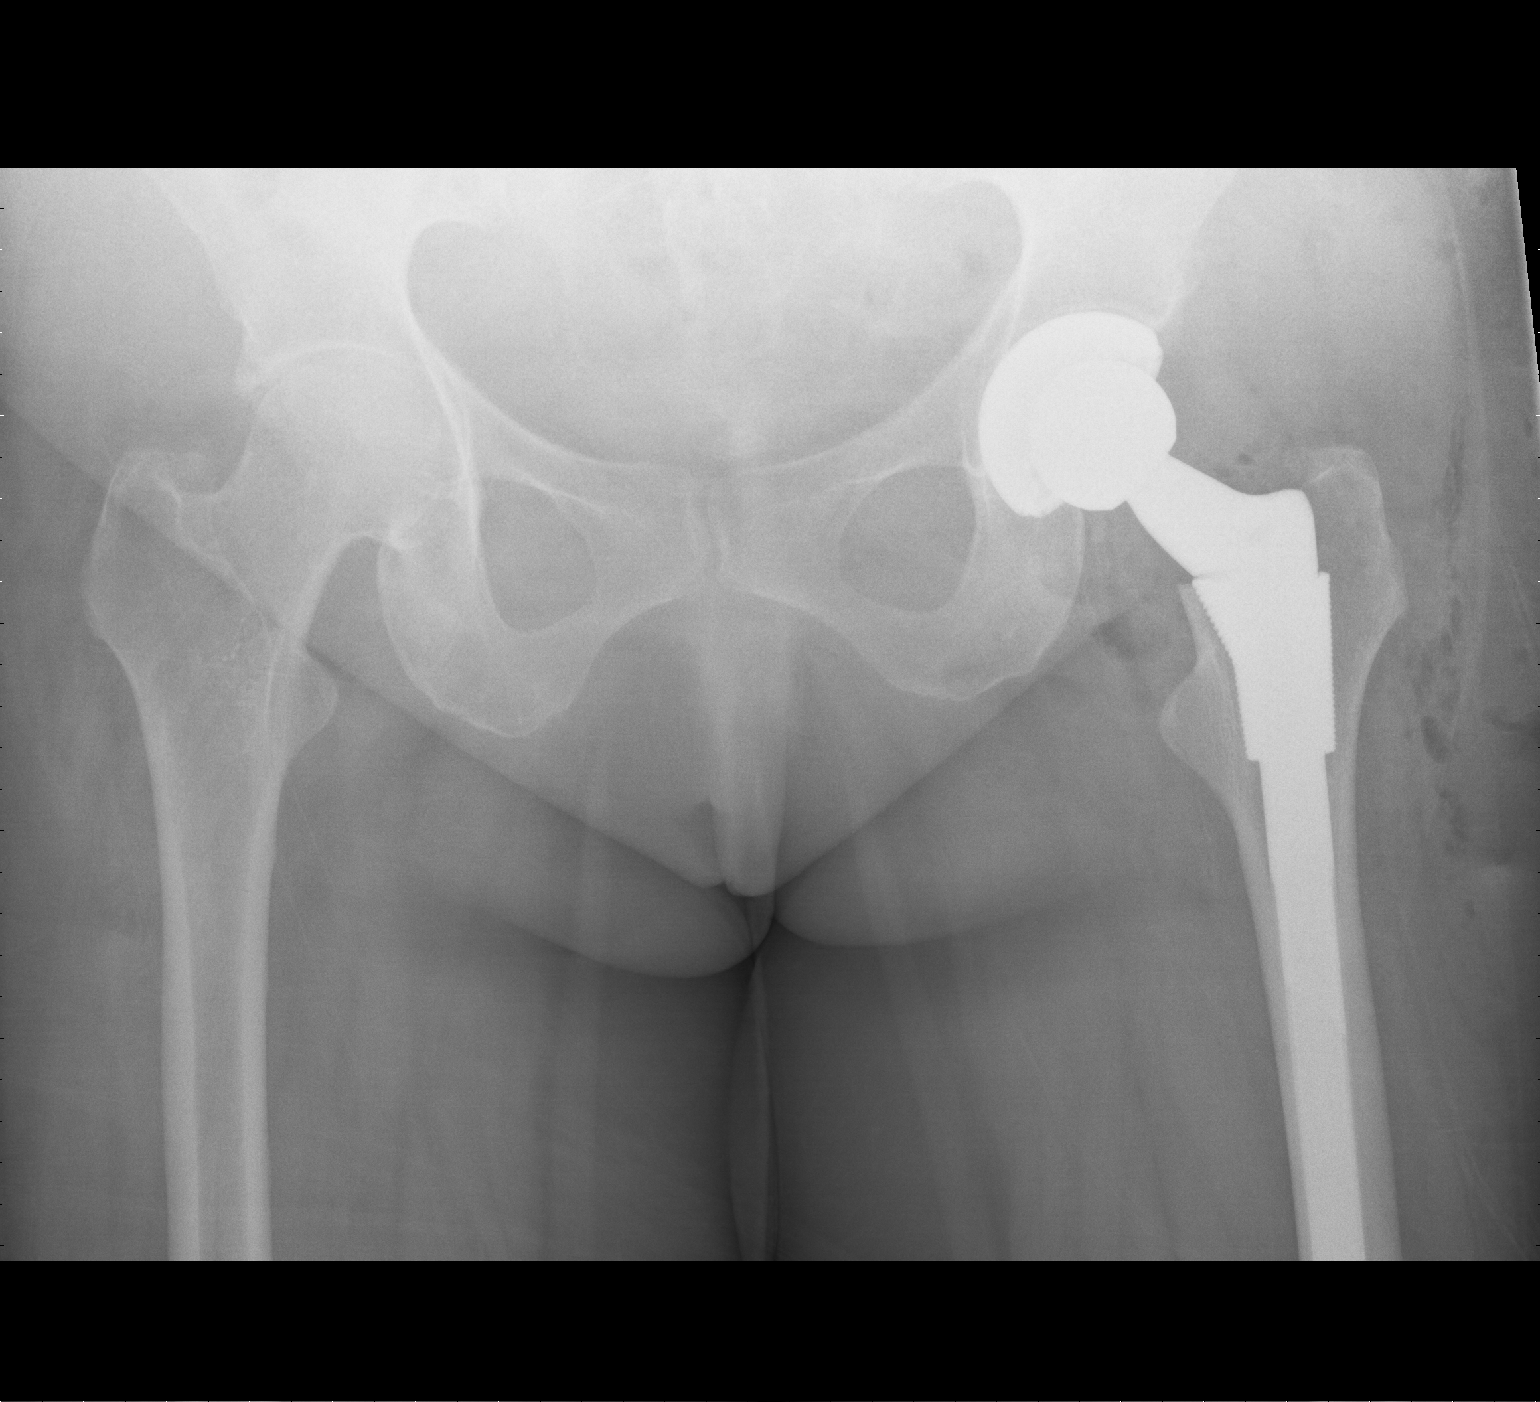

[2 of 2 positions shown; findings below may reference images not displayed]

FINDINGS: Surgical changes of left total hip arthroplasty without
evidence of immediate hardware complication.  The femoral head
component appears located within the acetabular component.
Expected subcutaneous emphysema in the soft tissues of the left
hip.  Moderate right hip osteoarthritis noted incidentally.
IMPRESSION: Left hip arthroplasty without evidence of immediate hardware
complication.

## 2014-09-13 DIAGNOSIS — Z79899 Other long term (current) drug therapy: Secondary | ICD-10-CM | POA: Diagnosis not present

## 2014-10-10 DIAGNOSIS — M5137 Other intervertebral disc degeneration, lumbosacral region: Secondary | ICD-10-CM | POA: Diagnosis not present

## 2014-10-10 DIAGNOSIS — M0579 Rheumatoid arthritis with rheumatoid factor of multiple sites without organ or systems involvement: Secondary | ICD-10-CM | POA: Diagnosis not present

## 2014-10-10 DIAGNOSIS — G4701 Insomnia due to medical condition: Secondary | ICD-10-CM | POA: Diagnosis not present

## 2014-10-10 DIAGNOSIS — M797 Fibromyalgia: Secondary | ICD-10-CM | POA: Diagnosis not present

## 2014-12-08 ENCOUNTER — Other Ambulatory Visit: Payer: Self-pay | Admitting: *Deleted

## 2014-12-08 MED ORDER — SPIRONOLACTONE 25 MG PO TABS: 12.5000 mg | ORAL_TABLET | Freq: Every day | ORAL | Status: DC

## 2014-12-26 ENCOUNTER — Other Ambulatory Visit: Payer: Self-pay

## 2015-06-15 ENCOUNTER — Other Ambulatory Visit: Payer: Self-pay | Admitting: Cardiology

## 2015-07-14 ENCOUNTER — Encounter: Payer: Self-pay | Admitting: Cardiology

## 2015-07-14 ENCOUNTER — Ambulatory Visit (INDEPENDENT_AMBULATORY_CARE_PROVIDER_SITE_OTHER): Payer: PPO | Admitting: Cardiology

## 2015-07-14 VITALS — BP 120/72 | HR 53 | Ht 63.0 in | Wt 180.8 lb

## 2015-07-14 DIAGNOSIS — R002 Palpitations: Secondary | ICD-10-CM

## 2015-07-14 DIAGNOSIS — I493 Ventricular premature depolarization: Secondary | ICD-10-CM | POA: Diagnosis not present

## 2015-07-14 DIAGNOSIS — I10 Essential (primary) hypertension: Secondary | ICD-10-CM | POA: Diagnosis not present

## 2015-07-14 DIAGNOSIS — E785 Hyperlipidemia, unspecified: Secondary | ICD-10-CM

## 2015-07-14 DIAGNOSIS — E876 Hypokalemia: Secondary | ICD-10-CM | POA: Diagnosis not present

## 2015-07-14 NOTE — Patient Instructions (Signed)
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
## 2015-07-19 DIAGNOSIS — M069 Rheumatoid arthritis, unspecified: Secondary | ICD-10-CM | POA: Diagnosis not present

## 2015-07-19 DIAGNOSIS — E039 Hypothyroidism, unspecified: Secondary | ICD-10-CM | POA: Diagnosis not present

## 2015-07-19 DIAGNOSIS — E785 Hyperlipidemia, unspecified: Secondary | ICD-10-CM | POA: Diagnosis not present

## 2015-07-19 DIAGNOSIS — K219 Gastro-esophageal reflux disease without esophagitis: Secondary | ICD-10-CM | POA: Diagnosis not present

## 2015-07-19 DIAGNOSIS — M797 Fibromyalgia: Secondary | ICD-10-CM | POA: Diagnosis not present

## 2015-07-19 DIAGNOSIS — I10 Essential (primary) hypertension: Secondary | ICD-10-CM | POA: Diagnosis not present

## 2015-07-25 DIAGNOSIS — Z86018 Personal history of other benign neoplasm: Secondary | ICD-10-CM | POA: Diagnosis not present

## 2015-07-25 DIAGNOSIS — Z23 Encounter for immunization: Secondary | ICD-10-CM | POA: Diagnosis not present

## 2015-07-25 DIAGNOSIS — D2271 Melanocytic nevi of right lower limb, including hip: Secondary | ICD-10-CM | POA: Diagnosis not present

## 2015-07-25 DIAGNOSIS — B009 Herpesviral infection, unspecified: Secondary | ICD-10-CM | POA: Diagnosis not present

## 2015-07-25 DIAGNOSIS — Z85828 Personal history of other malignant neoplasm of skin: Secondary | ICD-10-CM | POA: Diagnosis not present

## 2015-07-25 DIAGNOSIS — D225 Melanocytic nevi of trunk: Secondary | ICD-10-CM | POA: Diagnosis not present

## 2015-07-25 DIAGNOSIS — R202 Paresthesia of skin: Secondary | ICD-10-CM | POA: Diagnosis not present

## 2015-07-25 DIAGNOSIS — L821 Other seborrheic keratosis: Secondary | ICD-10-CM | POA: Diagnosis not present

## 2015-07-25 DIAGNOSIS — Z808 Family history of malignant neoplasm of other organs or systems: Secondary | ICD-10-CM | POA: Diagnosis not present

## 2015-07-26 DIAGNOSIS — Z01419 Encounter for gynecological examination (general) (routine) without abnormal findings: Secondary | ICD-10-CM | POA: Diagnosis not present

## 2015-07-26 DIAGNOSIS — Z124 Encounter for screening for malignant neoplasm of cervix: Secondary | ICD-10-CM | POA: Diagnosis not present

## 2015-07-26 DIAGNOSIS — Z6831 Body mass index (BMI) 31.0-31.9, adult: Secondary | ICD-10-CM | POA: Diagnosis not present

## 2015-08-01 DIAGNOSIS — Z79899 Other long term (current) drug therapy: Secondary | ICD-10-CM | POA: Diagnosis not present

## 2015-08-14 DIAGNOSIS — Z1231 Encounter for screening mammogram for malignant neoplasm of breast: Secondary | ICD-10-CM | POA: Diagnosis not present

## 2015-09-06 ENCOUNTER — Other Ambulatory Visit: Payer: Self-pay | Admitting: Cardiology

## 2015-09-06 DIAGNOSIS — M79641 Pain in right hand: Secondary | ICD-10-CM | POA: Diagnosis not present

## 2015-09-06 DIAGNOSIS — Z09 Encounter for follow-up examination after completed treatment for conditions other than malignant neoplasm: Secondary | ICD-10-CM | POA: Diagnosis not present

## 2015-09-06 DIAGNOSIS — M0579 Rheumatoid arthritis with rheumatoid factor of multiple sites without organ or systems involvement: Secondary | ICD-10-CM | POA: Diagnosis not present

## 2015-09-06 DIAGNOSIS — M5137 Other intervertebral disc degeneration, lumbosacral region: Secondary | ICD-10-CM | POA: Diagnosis not present

## 2015-09-26 DIAGNOSIS — H18412 Arcus senilis, left eye: Secondary | ICD-10-CM | POA: Diagnosis not present

## 2015-09-26 DIAGNOSIS — H02839 Dermatochalasis of unspecified eye, unspecified eyelid: Secondary | ICD-10-CM | POA: Diagnosis not present

## 2015-09-26 DIAGNOSIS — H2513 Age-related nuclear cataract, bilateral: Secondary | ICD-10-CM | POA: Diagnosis not present

## 2015-09-26 DIAGNOSIS — H18411 Arcus senilis, right eye: Secondary | ICD-10-CM | POA: Diagnosis not present

## 2015-09-26 DIAGNOSIS — Z961 Presence of intraocular lens: Secondary | ICD-10-CM | POA: Diagnosis not present

## 2015-09-26 DIAGNOSIS — H2511 Age-related nuclear cataract, right eye: Secondary | ICD-10-CM | POA: Diagnosis not present

## 2015-10-25 DIAGNOSIS — Z79899 Other long term (current) drug therapy: Secondary | ICD-10-CM | POA: Diagnosis not present

## 2015-10-25 DIAGNOSIS — M2550 Pain in unspecified joint: Secondary | ICD-10-CM | POA: Diagnosis not present

## 2015-11-17 DIAGNOSIS — H25811 Combined forms of age-related cataract, right eye: Secondary | ICD-10-CM | POA: Diagnosis not present

## 2015-11-17 DIAGNOSIS — H2511 Age-related nuclear cataract, right eye: Secondary | ICD-10-CM | POA: Diagnosis not present

## 2016-01-23 DIAGNOSIS — I10 Essential (primary) hypertension: Secondary | ICD-10-CM | POA: Diagnosis not present

## 2016-01-23 DIAGNOSIS — E785 Hyperlipidemia, unspecified: Secondary | ICD-10-CM | POA: Diagnosis not present

## 2016-01-23 DIAGNOSIS — M069 Rheumatoid arthritis, unspecified: Secondary | ICD-10-CM | POA: Diagnosis not present

## 2016-01-23 DIAGNOSIS — Z23 Encounter for immunization: Secondary | ICD-10-CM | POA: Diagnosis not present

## 2016-01-23 DIAGNOSIS — Z1211 Encounter for screening for malignant neoplasm of colon: Secondary | ICD-10-CM | POA: Diagnosis not present

## 2016-01-23 DIAGNOSIS — E039 Hypothyroidism, unspecified: Secondary | ICD-10-CM | POA: Diagnosis not present

## 2016-01-23 DIAGNOSIS — Z0000 Encounter for general adult medical examination without abnormal findings: Secondary | ICD-10-CM | POA: Diagnosis not present

## 2016-02-06 DIAGNOSIS — M79641 Pain in right hand: Secondary | ICD-10-CM | POA: Diagnosis not present

## 2016-02-06 DIAGNOSIS — M8580 Other specified disorders of bone density and structure, unspecified site: Secondary | ICD-10-CM | POA: Diagnosis not present

## 2016-02-06 DIAGNOSIS — Z79899 Other long term (current) drug therapy: Secondary | ICD-10-CM | POA: Diagnosis not present

## 2016-02-06 DIAGNOSIS — M0579 Rheumatoid arthritis with rheumatoid factor of multiple sites without organ or systems involvement: Secondary | ICD-10-CM | POA: Diagnosis not present

## 2016-02-06 DIAGNOSIS — M79642 Pain in left hand: Secondary | ICD-10-CM | POA: Diagnosis not present

## 2016-02-07 DIAGNOSIS — Z1211 Encounter for screening for malignant neoplasm of colon: Secondary | ICD-10-CM | POA: Diagnosis not present

## 2016-02-14 DIAGNOSIS — M85851 Other specified disorders of bone density and structure, right thigh: Secondary | ICD-10-CM | POA: Diagnosis not present

## 2016-03-01 DIAGNOSIS — R195 Other fecal abnormalities: Secondary | ICD-10-CM | POA: Diagnosis not present

## 2016-03-01 DIAGNOSIS — K219 Gastro-esophageal reflux disease without esophagitis: Secondary | ICD-10-CM | POA: Diagnosis not present

## 2016-03-18 DIAGNOSIS — M1711 Unilateral primary osteoarthritis, right knee: Secondary | ICD-10-CM | POA: Diagnosis not present

## 2016-03-18 DIAGNOSIS — M19071 Primary osteoarthritis, right ankle and foot: Secondary | ICD-10-CM | POA: Diagnosis not present

## 2016-04-02 DIAGNOSIS — K317 Polyp of stomach and duodenum: Secondary | ICD-10-CM | POA: Diagnosis not present

## 2016-04-02 DIAGNOSIS — K640 First degree hemorrhoids: Secondary | ICD-10-CM | POA: Diagnosis not present

## 2016-04-02 DIAGNOSIS — R195 Other fecal abnormalities: Secondary | ICD-10-CM | POA: Diagnosis not present

## 2016-04-02 DIAGNOSIS — K635 Polyp of colon: Secondary | ICD-10-CM | POA: Diagnosis not present

## 2016-04-02 DIAGNOSIS — K621 Rectal polyp: Secondary | ICD-10-CM | POA: Diagnosis not present

## 2016-04-02 DIAGNOSIS — K219 Gastro-esophageal reflux disease without esophagitis: Secondary | ICD-10-CM | POA: Diagnosis not present

## 2016-04-09 DIAGNOSIS — K635 Polyp of colon: Secondary | ICD-10-CM | POA: Diagnosis not present

## 2016-05-10 DIAGNOSIS — K219 Gastro-esophageal reflux disease without esophagitis: Secondary | ICD-10-CM | POA: Diagnosis not present

## 2016-05-10 DIAGNOSIS — Z23 Encounter for immunization: Secondary | ICD-10-CM | POA: Diagnosis not present

## 2016-05-10 DIAGNOSIS — I10 Essential (primary) hypertension: Secondary | ICD-10-CM | POA: Diagnosis not present

## 2016-05-27 ENCOUNTER — Other Ambulatory Visit: Payer: Self-pay | Admitting: Rheumatology

## 2016-05-27 DIAGNOSIS — Z79899 Other long term (current) drug therapy: Secondary | ICD-10-CM

## 2016-05-27 DIAGNOSIS — Z79631 Long term (current) use of antimetabolite agent: Secondary | ICD-10-CM

## 2016-05-28 DIAGNOSIS — Z79899 Other long term (current) drug therapy: Secondary | ICD-10-CM | POA: Diagnosis not present

## 2016-05-28 LAB — CBC WITH DIFFERENTIAL/PLATELET
Basophils Absolute: 0 cells/uL (ref 0–200)
Basophils Relative: 0 %
Eosinophils Absolute: 142 cells/uL (ref 15–500)
Eosinophils Relative: 2 %
HCT: 39.8 % (ref 35.0–45.0)
Hemoglobin: 13.4 g/dL (ref 11.7–15.5)
Lymphocytes Relative: 25 %
Lymphs Abs: 1775 cells/uL (ref 850–3900)
MCH: 33.3 pg — ABNORMAL HIGH (ref 27.0–33.0)
MCHC: 33.7 g/dL (ref 32.0–36.0)
MCV: 98.8 fL (ref 80.0–100.0)
MPV: 9.3 fL (ref 7.5–12.5)
Monocytes Absolute: 710 cells/uL (ref 200–950)
Monocytes Relative: 10 %
Neutro Abs: 4473 cells/uL (ref 1500–7800)
Neutrophils Relative %: 63 %
Platelets: 256 10*3/uL (ref 140–400)
RBC: 4.03 MIL/uL (ref 3.80–5.10)
RDW: 13.6 % (ref 11.0–15.0)
WBC: 7.1 10*3/uL (ref 3.8–10.8)

## 2016-05-28 LAB — COMPLETE METABOLIC PANEL WITH GFR
ALT: 28 U/L (ref 6–29)
AST: 25 U/L (ref 10–35)
Albumin: 4.2 g/dL (ref 3.6–5.1)
Alkaline Phosphatase: 59 U/L (ref 33–130)
BUN: 17 mg/dL (ref 7–25)
CO2: 27 mmol/L (ref 20–31)
Calcium: 9.4 mg/dL (ref 8.6–10.4)
Chloride: 104 mmol/L (ref 98–110)
Creat: 0.85 mg/dL (ref 0.50–0.99)
GFR, Est African American: 82 mL/min (ref >=60)
GFR, Est Non African American: 71 mL/min (ref >=60)
Glucose, Bld: 87 mg/dL (ref 65–99)
Potassium: 4.2 mmol/L (ref 3.5–5.3)
Sodium: 140 mmol/L (ref 135–146)
Total Bilirubin: 0.7 mg/dL (ref 0.2–1.2)
Total Protein: 6.4 g/dL (ref 6.1–8.1)

## 2016-05-28 NOTE — Telephone Encounter (Addendum)
I spoke to patient/ she is coming in now for the labs Ok to refill per Dr Estanislado Pandy

## 2016-06-25 DIAGNOSIS — Z961 Presence of intraocular lens: Secondary | ICD-10-CM | POA: Diagnosis not present

## 2016-06-25 DIAGNOSIS — H02839 Dermatochalasis of unspecified eye, unspecified eyelid: Secondary | ICD-10-CM | POA: Diagnosis not present

## 2016-06-25 DIAGNOSIS — H18413 Arcus senilis, bilateral: Secondary | ICD-10-CM | POA: Diagnosis not present

## 2016-07-04 ENCOUNTER — Telehealth: Payer: Self-pay

## 2016-07-04 NOTE — Telephone Encounter (Signed)
Received an approval fax from the [facility] for patients Enbrel.   Spoke with patient to inform her and she states she has already received a letter in the mail. She will be due for a refill next week. I told her about [facility] and she said she will think switching her Rx. She will let us know at her next visit on 07/11/16 with Mr. Carlyon Shadow.   Will scan document   Christinamarie Tall, Sharyn Blitz, CPhT

## 2016-07-11 ENCOUNTER — Encounter: Payer: Self-pay | Admitting: Rheumatology

## 2016-07-11 ENCOUNTER — Ambulatory Visit (INDEPENDENT_AMBULATORY_CARE_PROVIDER_SITE_OTHER): Payer: PPO | Admitting: Rheumatology

## 2016-07-11 VITALS — BP 128/78 | HR 64 | Resp 16 | Ht 63.0 in | Wt 184.0 lb

## 2016-07-11 DIAGNOSIS — M797 Fibromyalgia: Secondary | ICD-10-CM | POA: Diagnosis not present

## 2016-07-11 DIAGNOSIS — M25571 Pain in right ankle and joints of right foot: Secondary | ICD-10-CM

## 2016-07-11 DIAGNOSIS — Z79899 Other long term (current) drug therapy: Secondary | ICD-10-CM

## 2016-07-11 DIAGNOSIS — R5382 Chronic fatigue, unspecified: Secondary | ICD-10-CM

## 2016-07-11 DIAGNOSIS — M0579 Rheumatoid arthritis with rheumatoid factor of multiple sites without organ or systems involvement: Secondary | ICD-10-CM | POA: Diagnosis not present

## 2016-07-11 DIAGNOSIS — F5101 Primary insomnia: Secondary | ICD-10-CM

## 2016-07-11 LAB — CBC WITH DIFFERENTIAL/PLATELET
Basophils Absolute: 68 cells/uL (ref 0–200)
Basophils Relative: 1 %
Eosinophils Absolute: 136 cells/uL (ref 15–500)
Eosinophils Relative: 2 %
HCT: 40.1 % (ref 35.0–45.0)
Hemoglobin: 13.6 g/dL (ref 11.7–15.5)
Lymphocytes Relative: 22 %
Lymphs Abs: 1496 cells/uL (ref 850–3900)
MCH: 33.5 pg — ABNORMAL HIGH (ref 27.0–33.0)
MCHC: 33.9 g/dL (ref 32.0–36.0)
MCV: 98.8 fL (ref 80.0–100.0)
MPV: 9.7 fL (ref 7.5–12.5)
Monocytes Absolute: 612 cells/uL (ref 200–950)
Monocytes Relative: 9 %
Neutro Abs: 4488 cells/uL (ref 1500–7800)
Neutrophils Relative %: 66 %
Platelets: 254 10*3/uL (ref 140–400)
RBC: 4.06 MIL/uL (ref 3.80–5.10)
RDW: 13.3 % (ref 11.0–15.0)
WBC: 6.8 10*3/uL (ref 3.8–10.8)

## 2016-07-11 MED ORDER — METHOTREXATE (ANTI-RHEUMATIC) 2.5 MG PO TABS: 17.5000 mg | ORAL_TABLET | ORAL | 0 refills | Status: DC

## 2016-07-11 MED ORDER — GABAPENTIN 300 MG PO CAPS: 1200.0000 mg | ORAL_CAPSULE | Freq: Every day | ORAL | 1 refills | Status: DC

## 2016-07-11 MED ORDER — ENBREL SURECLICK 50 MG/ML ~~LOC~~ SOAJ: 50.0000 mg | SUBCUTANEOUS | 2 refills | Status: DC

## 2016-07-11 MED ORDER — FOLIC ACID 1 MG PO TABS: 2.0000 mg | ORAL_TABLET | Freq: Every day | ORAL | 3 refills | Status: DC

## 2016-07-11 NOTE — Patient Instructions (Signed)
Chronic Ankle Instability Rehab After Surgery Ask your health care provider which exercises are safe for you. Do exercises exactly as told by your health care provider and adjust them as directed. It is normal to feel mild stretching, pulling, tightness, or discomfort as you do these exercises, but you should stop right away if you feel sudden pain or your pain gets worse.Do not begin these exercises until told by your health care provider. Stretching and range of motion exercises These exercises warm up your muscles and joints and improve the movement and flexibility of your ankle. These exercises also help to relieve pain, numbness, and tingling. Exercise A: Dorsiflexion/plantar flexion 1. Sit with your left / right knee straight or bent. Do not rest your foot on anything. 2. Flex your left / right ankle to tilt the top of your foot toward your shin. 3. Hold this position for __________ seconds. 4. Point your toes downward to tilt the top of your foot away from your shin. 5. Hold this position for __________ seconds. Repeat __________ times with your knee straight, then __________ times with your knee bent. Complete this exercise __________ times a day. Exercise B: Ankle alphabet 1. Sit with your left / right leg supported at the lower leg.  Do not rest your foot on anything.  Make sure your foot has room to move freely. 2. Think of your left / right foot as a paintbrush, and move your foot to trace each letter of the alphabet in the air. Keep your hip and knee still while you trace. Make the letters as large as you can without feeling discomfort. 3. Trace every letter from A to Z. Repeat __________ times. Complete this exercise __________ times a day. Exercise C: Gastrocsoleus 1. Sit on the floor with your left / right leg extended. 2. Loop a belt or towel around the ball of your left / right foot. The ball of your foot is on the walking surface, right under your toes. 3. Keep your left /  right ankle and foot relaxed and keep your knee straight while you use the belt or towel to pull your foot and ankle toward you. You should feel a gentle stretch behind your calf or knee. 4. Hold this position for __________ seconds. Repeat __________ times. Complete this exercise __________ times a day. Exercise D: Ankle dorsiflexion, active-assisted 1. Sit on a chair that is placed on a non-carpeted surface. 2. Place your left / right foot on the floor, directly under your knee. Extend your other leg for support. 3. Keeping your heel down, slide your left / right foot back toward the chair until you feel a stretch at your ankle or calf. If you do not feel a stretch, slide your buttocks forward to the edge of the chair. 4. Hold this stretch for __________ seconds. Repeat __________ times. Complete this stretch __________ times a day. Strengthening exercises These exercises build strength and endurance in your ankle. Endurance is the ability to use your muscles for a long time, even after they get tired. Exercise E: Dorsiflexion with band 1. Secure a rubber exercise band or tube to an object, like a table leg, that will not move if it is pulled on. Secure the other end around your left / right foot. 2. Sit on the floor facing the object with your left / right foot extended. The band or tube should be slightly tense when your foot is relaxed. 3. Slowly flex your left / right ankle and toes  to bring your foot toward you. 4. Hold this position for __________ seconds. 5. Let the band or tube slowly pull your foot back to the starting position. Repeat __________ times. Complete this exercise times a day. Exercise F: Plantar flexion with band 1. Sit on the floor with your left / right leg extended. 2. Loop a rubber exercise band or tube around the ball of your left / right foot. The ball of your foot is on the walking surface, right under your toes. The band or tube should be slightly tense when your  foot is relaxed. 3. Slowly point your toes downward, pushing them away from you. 4. Hold this position for __________ seconds. 5. Let the band or tube slowly pull your foot back to the starting position. Repeat __________ times. Complete this exercise __________ times a day. Exercise G: Ankle eversion with band 1. Secure one end of an exercise band or tubing to a fixed object, such as a table leg or a pole, that will stay still when the band is pulled. 2. Loop the other end of the band around the middle of your left / right foot. 3. Sit on the floor, facing the fixed object. The band should be slightly tense when your foot is relaxed. 4. Make fists with your hands and put them between your knees. This will focus your strengthening at your ankle. 5. Leading with your little toe, slowly push your banded foot outward, away from your body. The band or tube should be adding resistance. 6. Hold this position for __________ seconds. 7. Slowly return your foot to the starting position while controlling the tension in the band. Repeat __________ times. Complete this exercise __________ times a day. Exercise H: Ankle inversion with band 1. Secure one end of an exercise band or tubing to a fixed object, such as a table leg or a pole, that will stay still when the band is pulled. 2. Loop the other end of the band around your left / right foot, near your toes. 3. Sit on the floor, facing the fixed object. The band should be slightly tense when your foot is relaxed. 4. Make fists with your hands and put them between your knees. This will focus your strengthening at your ankle. 5. Leading with your big toe, slowly pull your banded foot inward, toward your body. The band or tube should be adding resistance. 6. Hold this position for __________ seconds. 7. Let the band or tube slowly pull your foot back to the starting position. Repeat __________ times. Complete this exercises __________ times a day. Exercise  I: Towel curls 1. Sit in a chair on a non-carpeted surface, and put your feet on the floor. 2. Place a towel in front of your feet. If told by your health care provider, add __________ to the end of the towel. 3. Keeping your heel on the floor, put your left / right footon the towel. 4. Pull the towel toward you by grabbing the towel with your toes and curling them under. Keep your heel on the floor. Repeat __________ times. Complete this exercise __________ times a day. This information is not intended to replace advice given to you by your health care provider. Make sure you discuss any questions you have with your health care provider. Document Released: 10/09/2015 Document Revised: 02/22/2016 Document Reviewed: 07/01/2015 Elsevier Interactive Patient Education  2017 [employer].

## 2016-07-11 NOTE — Progress Notes (Signed)
Rheumatology Medication Review by a Pharmacist Does the patient feel that his/her medications are working for him/her?  Yes Has the patient been experiencing any side effects to the medications prescribed?  No Does the patient have any problems obtaining medications?  No  Issues to address at subsequent visits: None   Pharmacist comments:  Kari [name] is a pleasant 66 yo F who presents for follow up of her rheumatoid arthritis.  She is currently taking Enbrel 50 mg weekly, methotrexate 7 tablets (17.5 mg) weekly, and folic acid Q000111Q mcg twice daily.  [name] recent labs were on 05/28/16 at which time CBC and CMP were normal.  Patient's [name] recent TB Gold was on 10/25/15 which was negative.  She will be due for TB Gold again in April 2018.  Patient denies any questions regarding her medications at this time.    Kari [name], Pharm.D., BCPS Clinical Pharmacist Pager: [phone] Phone: [phone] 07/11/2016 9:56 AM

## 2016-07-11 NOTE — Progress Notes (Signed)
Office Visit Note  Patient: Kari [name]             Date of Birth: [birth year]           MRN: [id number]             PCP: Aretta Nip, MD Referring: Aretta Nip, MD Visit Date: 07/11/2016 Occupation: @GUAROCC @    Subjective:  No chief complaint on file. Doing well with RA, occasional right ankle will turn at times.  History of Present Illness: Kari [name] is a 66 y.o. female  Last seen 02/06/2016  Doing well with RA.  Taking Enbrel every week with good response. (Note the patient failed Humira). Taking methotrexate 7 pills per week. Folic acid 2 pills every day.  Patient is due for her blood work now. Her last blood work was about November 2017.  Patient is planning on traveling to [place] sometime in April for about 20 days or so. She'll be needing refill on her medications about the time she'll be leaving again and we've discussed strategies to make sure she doesn't run out of her Enbrel.  Activities of Daily Living:  Patient reports morning stiffness for 15 minutes.   Patient Denies nocturnal pain.  Difficulty dressing/grooming: Denies Difficulty climbing stairs: Denies Difficulty getting out of chair: Denies Difficulty using hands for taps, buttons, cutlery, and/or writing: Denies     Review of Systems  Constitutional: Positive for fatigue.  HENT: Negative for mouth sores and mouth dryness.   Eyes: Negative for dryness.  Respiratory: Negative for shortness of breath.   Gastrointestinal: Negative for constipation and diarrhea.  Musculoskeletal: Positive for myalgias and myalgias.  Skin: Negative for sensitivity to sunlight.  Psychiatric/Behavioral: Positive for sleep disturbance. Negative for decreased concentration.    PMFS History:  Patient Active Problem List   Diagnosis Date Noted  . Hyperlipidemia 07/14/2015  . Palpitation 05/11/2014  . PVC (premature ventricular contraction) 05/11/2014  . Hypokalemia 05/11/2014  . Essential  hypertension 05/11/2014  . Mitral regurgitation 05/11/2014  . Arthritis, hip 02/03/2013    Past Medical History:  Diagnosis Date  . Arthritis   . Complication of anesthesia    when block attempted ax -turned red in preop-surg delayed  . Fibromyalgia   . GERD (gastroesophageal reflux disease)   . Hypertension   . Hypothyroidism   . MVP (mitral valve prolapse)   . Osteoporosis   . 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 Colon cancer    . Colon polyps    . Heart attack Father    Past Surgical History:  Procedure Laterality Date  . ABDOMINAL HYSTERECTOMY    . CESAREAN SECTION    . CHOLECYSTECTOMY    . COLONOSCOPY    . KNEE ARTHROSCOPY Right 2/11  . 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 ULNAR NERVE TRANSPOSITION  09/17/2011   Procedure: ULNAR NERVE DECOMPRESSION/TRANSPOSITION;  Surgeon: Cammie Sickle., MD;  Location: [facility];  Service: Orthopedics;  Laterality: Left;  decompression  . WRIST ARTHROSCOPY Right    x2-fusion    . WRIST ARTHROSCOPY Left    Social History   Social History Narrative  . No narrative on file     Objective: Vital Signs: BP 128/78   Pulse 64   Resp 16  Ht 5\' 3"  (1.6 m)   Wt 184 lb (83.5 kg)   BMI 32.59 kg/m    Physical Exam  Constitutional: She is oriented to person, place, and time. She appears well-developed and well-nourished.  HENT:  Head: Normocephalic and atraumatic.  Eyes: EOM are normal. Pupils are equal, round, and reactive to light.  Cardiovascular: Normal rate, regular rhythm and normal heart sounds.  Exam reveals no gallop and no friction rub.   No murmur heard. Pulmonary/Chest: Effort normal and breath sounds normal. She has no wheezes. She has no rales.  Abdominal: Soft. Bowel sounds are normal. She exhibits no distension. There  is no tenderness. There is no guarding. No hernia.  Musculoskeletal: Normal range of motion. She exhibits no edema, tenderness or deformity.  Lymphadenopathy:    She has no cervical adenopathy.  Neurological: She is alert and oriented to person, place, and time. Coordination normal.  Skin: Skin is warm and dry. Capillary refill takes less than 2 seconds. No rash noted.  Psychiatric: She has a normal mood and affect. Her behavior is normal.  Nursing note and vitals reviewed.    Musculoskeletal Exam:  Full range of motion of all joints except decreased range of motion of right wrist secondary to previous injury and has hardware in there. Right wrist with plate. [name] strength is equal and strong bilaterally For myalgia tender points are 4 out of 18 positive  CDAI Exam: No CDAI exam completed.  No synovitis on exam No flexion or extension of the right wrist secondary to a plate in the wrist  Investigation: No additional findings.   Imaging: No results found.  Speciality Comments: No specialty comments available.    Procedures:  No procedures performed Allergies: Clinoril [sulindac]; Erythromycin; Hydrocodone; Penicillins; Sulfa antibiotics; Trimethoprim; and Vancomycin cross reactors   Assessment / Plan:     Visit Diagnoses: No diagnosis found.   Plan: #1: CBC with differential CMP with GFR today #2: CBC CMP with GFR and TB goal sometime in mid March 2018 prior to patient's overseas trip to [place] #3: Refill Enbrel for one month and then with 2 refills #4: Plan: When patient returns to our clinic in mid March for just the blood draw, we have offered her to sample of Enbrel so that she has enough medication to take with her when she goes overseas. I've asked the patient to give [place] a call at week before she comes for her blood draw to confirm that we have to Enbrel samples available for her.  #5: Patient has history of right ankle that turns on her at times. I advised her to  do exercises for her right ankle, if ineffective will do physical therapy. She can also wear brace, Ace wrap, hightop basketball shoes.  Orders: No orders of the defined types were placed in this encounter.  No orders of the defined types were placed in this encounter.   Face-to-face time spent with patient was 15 minutes. 50% of time was spent in counseling and coordination of care.  Follow-Up Instructions: Return in about 4 months (around 11/14/2016) for RA,rt ankle strain,enbrel q wk, mtx 7/wk, fms,neuropathy.   Eliezer Lofts, PA-C  Note - This record has been created using [employer].  Chart creation errors have been sought, but may not always  have been located. Such creation errors do not reflect on  the standard of medical care.

## 2016-07-12 LAB — COMPLETE METABOLIC PANEL WITHOUT GFR
ALT: 23 U/L (ref 6–29)
AST: 22 U/L (ref 10–35)
Albumin: 4.2 g/dL (ref 3.6–5.1)
Alkaline Phosphatase: 70 U/L (ref 33–130)
BUN: 16 mg/dL (ref 7–25)
CO2: 23 mmol/L (ref 20–31)
Calcium: 9.1 mg/dL (ref 8.6–10.4)
Chloride: 105 mmol/L (ref 98–110)
Creat: 1.11 mg/dL — ABNORMAL HIGH (ref 0.50–0.99)
GFR, Est African American: 59 mL/min — ABNORMAL LOW
GFR, Est Non African American: 52 mL/min — ABNORMAL LOW
Glucose, Bld: 99 mg/dL (ref 65–99)
Potassium: 3.9 mmol/L (ref 3.5–5.3)
Sodium: 140 mmol/L (ref 135–146)
Total Bilirubin: 0.8 mg/dL (ref 0.2–1.2)
Total Protein: 6.4 g/dL (ref 6.1–8.1)

## 2016-07-17 ENCOUNTER — Ambulatory Visit: Payer: PPO | Admitting: Cardiology

## 2016-07-19 ENCOUNTER — Ambulatory Visit: Payer: PPO | Admitting: Cardiology

## 2016-07-22 ENCOUNTER — Encounter: Payer: Self-pay | Admitting: Cardiology

## 2016-07-30 DIAGNOSIS — D225 Melanocytic nevi of trunk: Secondary | ICD-10-CM | POA: Diagnosis not present

## 2016-07-30 DIAGNOSIS — Z23 Encounter for immunization: Secondary | ICD-10-CM | POA: Diagnosis not present

## 2016-07-30 DIAGNOSIS — Z85828 Personal history of other malignant neoplasm of skin: Secondary | ICD-10-CM | POA: Diagnosis not present

## 2016-07-30 DIAGNOSIS — Z86018 Personal history of other benign neoplasm: Secondary | ICD-10-CM | POA: Diagnosis not present

## 2016-07-30 DIAGNOSIS — L720 Epidermal cyst: Secondary | ICD-10-CM | POA: Diagnosis not present

## 2016-07-30 DIAGNOSIS — Z808 Family history of malignant neoplasm of other organs or systems: Secondary | ICD-10-CM | POA: Diagnosis not present

## 2016-07-30 DIAGNOSIS — L821 Other seborrheic keratosis: Secondary | ICD-10-CM | POA: Diagnosis not present

## 2016-08-06 DIAGNOSIS — E039 Hypothyroidism, unspecified: Secondary | ICD-10-CM | POA: Diagnosis not present

## 2016-08-06 DIAGNOSIS — K219 Gastro-esophageal reflux disease without esophagitis: Secondary | ICD-10-CM | POA: Diagnosis not present

## 2016-08-06 DIAGNOSIS — E7800 Pure hypercholesterolemia, unspecified: Secondary | ICD-10-CM | POA: Diagnosis not present

## 2016-08-09 ENCOUNTER — Encounter: Payer: Self-pay | Admitting: Cardiology

## 2016-08-09 ENCOUNTER — Ambulatory Visit (INDEPENDENT_AMBULATORY_CARE_PROVIDER_SITE_OTHER): Payer: PPO | Admitting: Cardiology

## 2016-08-09 VITALS — BP 124/76 | HR 52 | Wt 181.8 lb

## 2016-08-09 DIAGNOSIS — E7800 Pure hypercholesterolemia, unspecified: Secondary | ICD-10-CM | POA: Diagnosis not present

## 2016-08-09 DIAGNOSIS — I493 Ventricular premature depolarization: Secondary | ICD-10-CM

## 2016-08-09 DIAGNOSIS — I10 Essential (primary) hypertension: Secondary | ICD-10-CM

## 2016-08-09 NOTE — Patient Instructions (Signed)
Medication Instructions:  Please discontinue Toprol and Atenolol. Continue all other medications as listed.  Follow-Up: Follow up in 1 year with Dr. Marlou Porch.  You will receive a letter in the mail 2 months before you are due.  Please call us when you receive this letter to schedule your follow up appointment.  If you need a refill on your cardiac medications before your next appointment, please call your pharmacy.  Thank you for choosing [facility]!!
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
## 2016-08-14 DIAGNOSIS — Z1231 Encounter for screening mammogram for malignant neoplasm of breast: Secondary | ICD-10-CM | POA: Diagnosis not present

## 2016-09-04 ENCOUNTER — Telehealth: Payer: Self-pay | Admitting: Rheumatology

## 2016-09-04 NOTE — Telephone Encounter (Signed)
Yes, we can give her samples of Enbrel ( I assume that is what you are requesting)

## 2016-09-04 NOTE — Telephone Encounter (Signed)
Last Visit: 07/11/16 Next visit: 12/12/16 Labs: 07/11/16 Tb Gold: 4/17 Neg Patient is coming to have labs done nest week as requested at her last appointment.   Okay to provide patient with samples?

## 2016-09-04 NOTE — Telephone Encounter (Signed)
Patient called stated that she needs 2 sample Enbrel pens for when she goes out of the country in the first week of April.  Cb#[phone].  Thank you.

## 2016-09-10 ENCOUNTER — Telehealth: Payer: Self-pay | Admitting: *Deleted

## 2016-09-10 ENCOUNTER — Other Ambulatory Visit: Payer: Self-pay | Admitting: *Deleted

## 2016-09-10 DIAGNOSIS — Z79899 Other long term (current) drug therapy: Secondary | ICD-10-CM

## 2016-09-10 LAB — CBC WITH DIFFERENTIAL/PLATELET
Basophils Absolute: 65 cells/uL (ref 0–200)
Basophils Relative: 1 %
Eosinophils Absolute: 130 cells/uL (ref 15–500)
Eosinophils Relative: 2 %
HCT: 39.6 % (ref 35.0–45.0)
Hemoglobin: 13.6 g/dL (ref 11.7–15.5)
Lymphocytes Relative: 23 %
Lymphs Abs: 1495 cells/uL (ref 850–3900)
MCH: 33.8 pg — ABNORMAL HIGH (ref 27.0–33.0)
MCHC: 34.3 g/dL (ref 32.0–36.0)
MCV: 98.5 fL (ref 80.0–100.0)
MPV: 9.5 fL (ref 7.5–12.5)
Monocytes Absolute: 520 cells/uL (ref 200–950)
Monocytes Relative: 8 %
Neutro Abs: 4290 cells/uL (ref 1500–7800)
Neutrophils Relative %: 66 %
Platelets: 235 10*3/uL (ref 140–400)
RBC: 4.02 MIL/uL (ref 3.80–5.10)
RDW: 13.6 % (ref 11.0–15.0)
WBC: 6.5 10*3/uL (ref 3.8–10.8)

## 2016-09-10 LAB — COMPLETE METABOLIC PANEL WITH GFR
ALT: 36 U/L — ABNORMAL HIGH (ref 6–29)
AST: 38 U/L — ABNORMAL HIGH (ref 10–35)
Albumin: 4.3 g/dL (ref 3.6–5.1)
Alkaline Phosphatase: 65 U/L (ref 33–130)
BUN: 16 mg/dL (ref 7–25)
CO2: 27 mmol/L (ref 20–31)
Calcium: 8.9 mg/dL (ref 8.6–10.4)
Chloride: 105 mmol/L (ref 98–110)
Creat: 0.91 mg/dL (ref 0.50–0.99)
GFR, Est African American: 76 mL/min (ref >=60)
GFR, Est Non African American: 65 mL/min (ref >=60)
Glucose, Bld: 88 mg/dL (ref 65–99)
Potassium: 4.4 mmol/L (ref 3.5–5.3)
Sodium: 141 mmol/L (ref 135–146)
Total Bilirubin: 0.8 mg/dL (ref 0.2–1.2)
Total Protein: 6.4 g/dL (ref 6.1–8.1)

## 2016-09-10 NOTE — Telephone Encounter (Signed)
Medication Samples have been provided to the patient.  Drug name: Enbrel       Strength: 50 mg        Qty: 2  LOT: [id number]  Exp.Date: 07/2018  Dosing instructions: Inject 1 pen SQ weekly  The patient has been instructed regarding the correct time, dose, and frequency of taking this medication, including desired effects and most common side effects.   Gwenlyn Perking 11:44 AM 09/10/2016

## 2016-09-12 LAB — QUANTIFERON TB GOLD ASSAY (BLOOD)
Interferon Gamma Release Assay: NEGATIVE
Mitogen-Nil: 9.57 IU/mL
Quantiferon Nil Value: 0.06 IU/mL
Quantiferon Tb Ag Minus Nil Value: <0 IU/mL

## 2016-09-16 ENCOUNTER — Other Ambulatory Visit: Payer: Self-pay | Admitting: Cardiology

## 2016-09-25 ENCOUNTER — Other Ambulatory Visit: Payer: Self-pay | Admitting: Rheumatology

## 2016-09-25 NOTE — Telephone Encounter (Signed)
Last Visit: 07/11/16 Next visit: 12/12/16 Labs: 09/10/16 AST elevated at 38 and ALT elevated at 36 (we will monitor) Tb Gold: 4/17 Neg  Okay to refill Enbrel?

## 2016-11-19 ENCOUNTER — Other Ambulatory Visit: Payer: Self-pay

## 2016-11-19 DIAGNOSIS — Z79899 Other long term (current) drug therapy: Secondary | ICD-10-CM

## 2016-11-19 DIAGNOSIS — Z79631 Long term (current) use of antimetabolite agent: Secondary | ICD-10-CM

## 2016-11-19 LAB — CBC WITH DIFFERENTIAL/PLATELET
Basophils Absolute: 83 cells/uL (ref 0–200)
Basophils Relative: 1 %
Eosinophils Absolute: 332 cells/uL (ref 15–500)
Eosinophils Relative: 4 %
HCT: 40.3 % (ref 35.0–45.0)
Hemoglobin: 13.9 g/dL (ref 11.7–15.5)
Lymphocytes Relative: 22 %
Lymphs Abs: 1826 cells/uL (ref 850–3900)
MCH: 33.8 pg — ABNORMAL HIGH (ref 27.0–33.0)
MCHC: 34.5 g/dL (ref 32.0–36.0)
MCV: 98.1 fL (ref 80.0–100.0)
MPV: 9.5 fL (ref 7.5–12.5)
Monocytes Absolute: 332 cells/uL (ref 200–950)
Monocytes Relative: 4 %
Neutro Abs: 5727 cells/uL (ref 1500–7800)
Neutrophils Relative %: 69 %
Platelets: 279 10*3/uL (ref 140–400)
RBC: 4.11 MIL/uL (ref 3.80–5.10)
RDW: 13.2 % (ref 11.0–15.0)
WBC: 8.3 10*3/uL (ref 3.8–10.8)

## 2016-11-20 LAB — COMPLETE METABOLIC PANEL WITH GFR
ALT: 24 U/L (ref 6–29)
AST: 27 U/L (ref 10–35)
Albumin: 4.4 g/dL (ref 3.6–5.1)
Alkaline Phosphatase: 69 U/L (ref 33–130)
BUN: 13 mg/dL (ref 7–25)
CO2: 23 mmol/L (ref 20–31)
Calcium: 9.2 mg/dL (ref 8.6–10.4)
Chloride: 104 mmol/L (ref 98–110)
Creat: 0.94 mg/dL (ref 0.50–0.99)
GFR, Est African American: 73 mL/min (ref >=60)
GFR, Est Non African American: 63 mL/min (ref >=60)
Glucose, Bld: 101 mg/dL — ABNORMAL HIGH (ref 65–99)
Potassium: 4.1 mmol/L (ref 3.5–5.3)
Sodium: 140 mmol/L (ref 135–146)
Total Bilirubin: 0.9 mg/dL (ref 0.2–1.2)
Total Protein: 6.5 g/dL (ref 6.1–8.1)

## 2016-11-20 NOTE — Progress Notes (Signed)
WNL

## 2016-11-21 ENCOUNTER — Other Ambulatory Visit: Payer: Self-pay | Admitting: *Deleted

## 2016-11-21 NOTE — Telephone Encounter (Signed)
ok 

## 2016-11-21 NOTE — Telephone Encounter (Signed)
Last Visit: 07/11/16 Next visit: 12/12/16 Labs: 11/15/16 WNL Tb Gold: 08/2016 Neg  Okay to refill Enbrel?

## 2016-11-22 ENCOUNTER — Telehealth: Payer: Self-pay

## 2016-11-22 MED ORDER — ENBREL SURECLICK 50 MG/ML ~~LOC~~ SOAJ: 50.0000 mg | SUBCUTANEOUS | 2 refills | Status: DC

## 2016-11-22 NOTE — Telephone Encounter (Addendum)
Received confirmation from [employer] regarding a prior authorization approval for Enbrel from 11/22/16 to 06/30/17.   Reference number:[id number]  Tried to call patient but did not get an answer and could not leave a message.   Renate Danh, [place], CPhT  2:35 PM

## 2016-11-22 NOTE — Telephone Encounter (Signed)
Received a request for a prior authorization for Enbrel through Cover my meds. A request was submitted. Will update once we receive a response.   Tamora Huneke, [place], CPhT 10:12 AM

## 2016-12-02 DIAGNOSIS — J029 Acute pharyngitis, unspecified: Secondary | ICD-10-CM | POA: Diagnosis not present

## 2016-12-12 ENCOUNTER — Encounter: Payer: Self-pay | Admitting: Rheumatology

## 2016-12-12 ENCOUNTER — Ambulatory Visit (INDEPENDENT_AMBULATORY_CARE_PROVIDER_SITE_OTHER): Payer: PPO | Admitting: Rheumatology

## 2016-12-12 VITALS — BP 138/63 | HR 56 | Resp 14 | Ht 63.0 in | Wt 182.0 lb

## 2016-12-12 DIAGNOSIS — M797 Fibromyalgia: Secondary | ICD-10-CM | POA: Diagnosis not present

## 2016-12-12 DIAGNOSIS — F5101 Primary insomnia: Secondary | ICD-10-CM | POA: Diagnosis not present

## 2016-12-12 DIAGNOSIS — M0579 Rheumatoid arthritis with rheumatoid factor of multiple sites without organ or systems involvement: Secondary | ICD-10-CM | POA: Diagnosis not present

## 2016-12-12 DIAGNOSIS — R5382 Chronic fatigue, unspecified: Secondary | ICD-10-CM | POA: Diagnosis not present

## 2016-12-12 DIAGNOSIS — Z79899 Other long term (current) drug therapy: Secondary | ICD-10-CM

## 2016-12-12 NOTE — Progress Notes (Signed)
Office Visit Note  Patient: Kari [name]             Date of Birth: 02/04/1951           MRN: [id number]             PCP: Aretta Nip, MD Referring: Aretta Nip, MD Visit Date: 12/12/2016 Occupation: @GUAROCC @    Subjective:  No chief complaint on file.   History of Present Illness: Kari [name] is a 66 y.o. female  Last seen 07/11/2016.  Patient has a history of rheumatoid arthritis with positive rheumatoid factor. She is on Enbrel every week. (Has failed Humira in the past) On methotrexate 7 per week Folic acid 2 per day.  Today, pt reports that her RA is doing well.  C/o of right knee pain. Was on a cruise for 28 days in April 2018. No falls or injuries.  May have overworked the right knee. Has appt w/ dr. Lynann Bologna Tuesday.    Activities of Daily Living:  Patient reports morning stiffness for 5 minutes.   Patient Denies nocturnal pain.  Difficulty dressing/grooming: Denies Difficulty climbing stairs: Denies Difficulty getting out of chair: Denies Difficulty using hands for taps, buttons, cutlery, and/or writing: Denies   Review of Systems  Constitutional: Positive for fatigue.  HENT: Negative for mouth sores and mouth dryness.   Eyes: Negative for dryness.  Respiratory: Negative for shortness of breath.   Gastrointestinal: Negative for constipation and diarrhea.  Musculoskeletal: Positive for myalgias and myalgias.  Skin: Negative for sensitivity to sunlight.  Psychiatric/Behavioral: Positive for sleep disturbance. Negative for decreased concentration.    PMFS History:  Patient Active Problem List   Diagnosis Date Noted  . Hyperlipidemia 07/14/2015  . Palpitation 05/11/2014  . PVC (premature ventricular contraction) 05/11/2014  . Hypokalemia 05/11/2014  . Essential hypertension 05/11/2014  . Mitral regurgitation 05/11/2014  . Arthritis, hip 02/03/2013    Past Medical History:  Diagnosis Date  . Arthritis   . Complication of  anesthesia    when block attempted ax -turned red in preop-surg delayed  . Fibromyalgia   . GERD (gastroesophageal reflux disease)   . Hypertension   . Hypothyroidism   . MVP (mitral valve prolapse)   . Osteoporosis   . 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 Colon cancer Unknown   . Colon polyps Unknown   . Heart attack Father    Past Surgical History:  Procedure Laterality Date  . ABDOMINAL HYSTERECTOMY    . CESAREAN SECTION    . CHOLECYSTECTOMY    . COLONOSCOPY    . KNEE ARTHROSCOPY Right 2/11  . 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 ULNAR NERVE TRANSPOSITION  09/17/2011   Procedure: ULNAR NERVE DECOMPRESSION/TRANSPOSITION;  Surgeon: Cammie Sickle., MD;  Location: [facility];  Service: Orthopedics;  Laterality: Left;  decompression  . WRIST ARTHROSCOPY Right    x2-fusion    . WRIST ARTHROSCOPY Left    Social History   Social History Narrative  . No narrative on file     Objective: Vital Signs: BP 138/63   Pulse (!) 56   Resp 14   Ht 5' 3"  (1.6 m)   Wt 182 lb (82.6 kg)   BMI 32.24 kg/m    Physical Exam  Constitutional: She is oriented to person, place, and time. She appears well-developed and well-nourished.  HENT:  Head: Normocephalic and atraumatic.  Eyes: EOM are normal. Pupils are equal, round, and reactive to light.  Cardiovascular: Normal rate, regular rhythm and normal heart sounds.  Exam reveals no gallop and no friction rub.   No murmur heard. Pulmonary/Chest: Effort normal and breath sounds normal. She has no wheezes. She has no rales.  Abdominal: Soft. Bowel sounds are normal. She exhibits no distension. There is no tenderness. There is no guarding. No hernia.  Musculoskeletal: Normal range of motion. She exhibits no edema, tenderness or deformity.    Lymphadenopathy:    She has no cervical adenopathy.  Neurological: She is alert and oriented to person, place, and time. Coordination normal.  Skin: Skin is warm and dry. Capillary refill takes less than 2 seconds. No rash noted.  Psychiatric: She has a normal mood and affect. Her behavior is normal.  Nursing note and vitals reviewed.    Musculoskeletal Exam:  Full range of motion of all joints except right wrist has fusion from previous erosions. Therefore unable to flex or extend the right wrist. Otherwise good range of motion of other joints. No synovitis. [name] strength is equal and strong bilaterally Fiber myalgia tender points are 6 out of 18 positive (bilateral trapezius muscles, bilateral greater trochanteric bursa, bilateral medial knees)  CDAI Exam: No CDAI exam completed.  No synovitis on examination. History of right wrist erosions with surgical intervention years and years ago.  Investigation: No additional findings.  Orders Only on 11/19/2016  Component Date Value Ref Range Status  . WBC 11/19/2016 8.3  3.8 - 10.8 K/uL Final  . RBC 11/19/2016 4.11  3.80 - 5.10 MIL/uL Final  . Hemoglobin 11/19/2016 13.9  11.7 - 15.5 g/dL Final  . HCT 11/19/2016 40.3  35.0 - 45.0 % Final  . MCV 11/19/2016 98.1  80.0 - 100.0 fL Final  . MCH 11/19/2016 33.8* 27.0 - 33.0 pg Final  . MCHC 11/19/2016 34.5  32.0 - 36.0 g/dL Final  . RDW 11/19/2016 13.2  11.0 - 15.0 % Final  . Platelets 11/19/2016 279  140 - 400 K/uL Final  . MPV 11/19/2016 9.5  7.5 - 12.5 fL Final  . Neutro Abs 11/19/2016 5727  1,500 - 7,800 cells/uL Final  . Lymphs Abs 11/19/2016 1826  850 - 3,900 cells/uL Final  . Monocytes Absolute 11/19/2016 332  200 - 950 cells/uL Final  . Eosinophils Absolute 11/19/2016 332  15 - 500 cells/uL Final  . Basophils Absolute 11/19/2016 83  0 - 200 cells/uL Final  . Neutrophils Relative % 11/19/2016 69  % Final  . Lymphocytes Relative 11/19/2016 22  % Final  . Monocytes Relative  11/19/2016 4  % Final  . Eosinophils Relative 11/19/2016 4  % Final  . Basophils Relative 11/19/2016 1  % Final  . Smear Review 11/19/2016 Criteria for review not met   Final  . Sodium 11/19/2016 140  135 - 146 mmol/L Final  . Potassium 11/19/2016 4.1  3.5 - 5.3 mmol/L Final  . Chloride 11/19/2016 104  98 - 110 mmol/L Final  . CO2 11/19/2016 23  20 - 31 mmol/L Final  . Glucose, Bld 11/19/2016 101* 65 - 99 mg/dL Final  . BUN 11/19/2016 13  7 - 25 mg/dL Final  . Creat 11/19/2016 0.94  0.50 - 0.99 mg/dL Final   Comment:   For patients > or = 68 years of age: The upper reference  limit for Creatinine is approximately 13% higher for people identified as African-American.     . Total Bilirubin 11/19/2016 0.9  0.2 - 1.2 mg/dL Final  . Alkaline Phosphatase 11/19/2016 69  33 - 130 U/L Final  . AST 11/19/2016 27  10 - 35 U/L Final  . ALT 11/19/2016 24  6 - 29 U/L Final  . Total Protein 11/19/2016 6.5  6.1 - 8.1 g/dL Final  . Albumin 11/19/2016 4.4  3.6 - 5.1 g/dL Final  . Calcium 11/19/2016 9.2  8.6 - 10.4 mg/dL Final  . GFR, Est African American 11/19/2016 73  >=60 mL/min Final  . GFR, Est Non African American 11/19/2016 63  >=60 mL/min Final  Orders Only on 09/10/2016  Component Date Value Ref Range Status  . WBC 09/10/2016 6.5  3.8 - 10.8 K/uL Final  . RBC 09/10/2016 4.02  3.80 - 5.10 MIL/uL Final  . Hemoglobin 09/10/2016 13.6  11.7 - 15.5 g/dL Final  . HCT 09/10/2016 39.6  35.0 - 45.0 % Final  . MCV 09/10/2016 98.5  80.0 - 100.0 fL Final  . MCH 09/10/2016 33.8* 27.0 - 33.0 pg Final  . MCHC 09/10/2016 34.3  32.0 - 36.0 g/dL Final  . RDW 09/10/2016 13.6  11.0 - 15.0 % Final  . Platelets 09/10/2016 235  140 - 400 K/uL Final  . MPV 09/10/2016 9.5  7.5 - 12.5 fL Final  . Neutro Abs 09/10/2016 4290  1,500 - 7,800 cells/uL Final  . Lymphs Abs 09/10/2016 1495  850 - 3,900 cells/uL Final  . Monocytes Absolute 09/10/2016 520  200 - 950 cells/uL Final  . Eosinophils Absolute 09/10/2016 130  15  - 500 cells/uL Final  . Basophils Absolute 09/10/2016 65  0 - 200 cells/uL Final  . Neutrophils Relative % 09/10/2016 66  % Final  . Lymphocytes Relative 09/10/2016 23  % Final  . Monocytes Relative 09/10/2016 8  % Final  . Eosinophils Relative 09/10/2016 2  % Final  . Basophils Relative 09/10/2016 1  % Final  . Smear Review 09/10/2016 Criteria for review not met   Final  . Sodium 09/10/2016 141  135 - 146 mmol/L Final  . Potassium 09/10/2016 4.4  3.5 - 5.3 mmol/L Final  . Chloride 09/10/2016 105  98 - 110 mmol/L Final  . CO2 09/10/2016 27  20 - 31 mmol/L Final  . Glucose, Bld 09/10/2016 88  65 - 99 mg/dL Final  . BUN 09/10/2016 16  7 - 25 mg/dL Final  . Creat 09/10/2016 0.91  0.50 - 0.99 mg/dL Final   Comment:   For patients > or = 68 years of age: The upper reference limit for Creatinine is approximately 13% higher for people identified as African-American.     . Total Bilirubin 09/10/2016 0.8  0.2 - 1.2 mg/dL Final  . Alkaline Phosphatase 09/10/2016 65  33 - 130 U/L Final  . AST 09/10/2016 38* 10 - 35 U/L Final  . ALT 09/10/2016 36* 6 - 29 U/L Final  . Total Protein 09/10/2016 6.4  6.1 - 8.1 g/dL Final  . Albumin 09/10/2016 4.3  3.6 - 5.1 g/dL Final  . Calcium 09/10/2016 8.9  8.6 - 10.4 mg/dL Final  . GFR, Est African American 09/10/2016 76  >=60 mL/min Final  . GFR, Est Non African American 09/10/2016 65  >=60 mL/min Final  . Interferon Gamma Release Assay 09/10/2016 NEGATIVE  NEGATIVE Final   Negative test result. M. tuberculosis complex infection unlikely.  Laurance Flatten Nil Value  09/10/2016 0.06  IU/mL Final  . Mitogen-Nil 09/10/2016 9.57  IU/mL Final  . Quantiferon Tb Ag Minus Nil Value 09/10/2016 <0.00  IU/mL Final   Comment:   The Nil tube value is used to determine if the patient has a preexisting immune response which could cause a false-positive reading on the test. In order for a test to be valid, the Nil tube must have a value of less than or equal to 8.0  IU/mL.   The mitogen control tube is used to assure the patient has a healthy immune status and also serves as a control for correct blood handling and incubation. It is used to detect false-negative readings. The mitogen tube must have a gamma interferon value of greater than or equal to 0.5 IU/mL higher than the value of the Nil tube.   The TB antigen tube is coated with the M. tuberculosis specific antigens. For a test to be considered positive, the TB antigen tube value minus the Nil tube value must be greater than or equal to 0.35 IU/mL.   For additional information, please refer to http://education.questdiagnostics.com/faq/QFT (This link is being provided for informational/educational purposes only.)   Office Visit on 07/11/2016  Component Date Value Ref Range Status  . WBC 07/11/2016 6.8  3.8 - 10.8 K/uL Final  . RBC 07/11/2016 4.06  3.80 - 5.10 MIL/uL Final  . Hemoglobin 07/11/2016 13.6  11.7 - 15.5 g/dL Final  . HCT 07/11/2016 40.1  35.0 - 45.0 % Final  . MCV 07/11/2016 98.8  80.0 - 100.0 fL Final  . MCH 07/11/2016 33.5* 27.0 - 33.0 pg Final  . MCHC 07/11/2016 33.9  32.0 - 36.0 g/dL Final  . RDW 07/11/2016 13.3  11.0 - 15.0 % Final  . Platelets 07/11/2016 254  140 - 400 K/uL Final  . MPV 07/11/2016 9.7  7.5 - 12.5 fL Final  . Neutro Abs 07/11/2016 4488  1,500 - 7,800 cells/uL Final  . Lymphs Abs 07/11/2016 1496  850 - 3,900 cells/uL Final  . Monocytes Absolute 07/11/2016 612  200 - 950 cells/uL Final  . Eosinophils Absolute 07/11/2016 136  15 - 500 cells/uL Final  . Basophils Absolute 07/11/2016 68  0 - 200 cells/uL Final  . Neutrophils Relative % 07/11/2016 66  % Final  . Lymphocytes Relative 07/11/2016 22  % Final  . Monocytes Relative 07/11/2016 9  % Final  . Eosinophils Relative 07/11/2016 2  % Final  . Basophils Relative 07/11/2016 1  % Final  . Smear Review 07/11/2016 Criteria for review not met   Final  . Sodium 07/11/2016 140  135 - 146 mmol/L Final  .  Potassium 07/11/2016 3.9  3.5 - 5.3 mmol/L Final  . Chloride 07/11/2016 105  98 - 110 mmol/L Final  . CO2 07/11/2016 23  20 - 31 mmol/L Final  . Glucose, Bld 07/11/2016 99  65 - 99 mg/dL Final  . BUN 07/11/2016 16  7 - 25 mg/dL Final  . Creat 07/11/2016 1.11* 0.50 - 0.99 mg/dL Final   Comment:   For patients > or = 68 years of age: The upper reference limit for Creatinine is approximately 13% higher for people identified as African-American.     . Total Bilirubin 07/11/2016 0.8  0.2 - 1.2 mg/dL Final  . Alkaline Phosphatase 07/11/2016 70  33 - 130 U/L Final  . AST 07/11/2016 22  10 - 35 U/L Final  . ALT 07/11/2016 23  6 - 29 U/L Final  . Total Protein  07/11/2016 6.4  6.1 - 8.1 g/dL Final  . Albumin 07/11/2016 4.2  3.6 - 5.1 g/dL Final  . Calcium 07/11/2016 9.1  8.6 - 10.4 mg/dL Final  . GFR, Est African American 07/11/2016 59* >=60 mL/min Final  . GFR, Est Non African American 07/11/2016 52* >=60 mL/min Final     Imaging: No results found.  Speciality Comments: No specialty comments available.    Procedures:  No procedures performed Allergies: Clinoril [sulindac]; Erythromycin; Hydrocodone; Penicillins; Sulfa antibiotics; Trimethoprim; and Vancomycin cross reactors   Assessment / Plan:     Visit Diagnoses: Rheumatoid arthritis with rheumatoid factor of multiple sites without organ or systems involvement ([place]) - 12/12/2016: Patient is doing well; no joint pain, swelling, stiffness.  High risk medication use - 12/12/2016: Adequate response with Enbrel every week, methotrexate 7 per week, folic acid 2 mg daily  Fibromyalgia - 12/12/2016: 6 out of 18 fibromyalgia tender points  Chronic fatigue  Primary insomnia   Plan: #1: Rheumatoid arthritis with positive rheumatoid factor. And high-risk prescription Doing well. No flare. Doing well with Enbrel every week, methotrexate 7 per week, folic acid 2 mg every day (adequate response).  #2: Fibromyalgia syndrome. Active  disease which arise pain and positive tender points (6 out of 18).  #3: Fatigue and insomnia  #4: Right knee pain. Patient has an appointment with Dr. Lynann Bologna next week.  #5: Return to clinic in 5 months  #6: Patient's labs will be due next month July 2018 ==> (CBC with differential, CMP with GFR) and every 3 months thereafter. TB gold is negative as of 09/12/2016   Orders: No orders of the defined types were placed in this encounter.  No orders of the defined types were placed in this encounter.   Face-to-face time spent with patient was 30 minutes. 50% of time was spent in counseling and coordination of care.  Follow-Up Instructions: No Follow-up on file.   Eliezer Lofts, PA-C  Note - This record has been created using [employer].  Chart creation errors have been sought, but may not always  have been located. Such creation errors do not reflect on  the standard of medical care.

## 2016-12-13 ENCOUNTER — Telehealth: Payer: Self-pay

## 2016-12-13 MED ORDER — METHOTREXATE (ANTI-RHEUMATIC) 2.5 MG PO TABS: 17.5000 mg | ORAL_TABLET | ORAL | 0 refills | Status: DC

## 2016-12-13 MED FILL — ENBREL 50 MG/ML SURECLICK S: 50 | 28 days supply | Qty: 4 | Fill #0

## 2016-12-13 NOTE — Addendum Note (Signed)
Addended by: Carole Binning on: 12/13/2016 04:35 PM   Modules accepted: Orders

## 2016-12-13 NOTE — Telephone Encounter (Signed)
Patient advised we have never received a refill request from [facility]. Patient states she is going to switch her medication to the [facility]. Patient is due for a refill at this time and is requesting it to be sent here.   Last Visit: 12/13/16 Next Visit: 05/14/17 Labs: 11/19/16 WNL  Okay to refill MTX?

## 2016-12-13 NOTE — Telephone Encounter (Signed)
Okay to refill methotrexate at [facility] per patient's request

## 2016-12-13 NOTE — Telephone Encounter (Signed)
Prescription sent to the pharmacy.

## 2016-12-13 NOTE — Telephone Encounter (Signed)
Patient called concerning a Rx refill for MTX.  Stated that she had called the Rx into her pharmacy 2 weeks ago and when she called again to see if Rx was ready, she was told that they were waiting for our office to respond.  Patient uses [facility].  CB# is [phone].

## 2016-12-17 DIAGNOSIS — M1711 Unilateral primary osteoarthritis, right knee: Secondary | ICD-10-CM | POA: Diagnosis not present

## 2016-12-17 MED FILL — METHOTREXATE 2.5 MG TABLET: 2.5 | 84 days supply | Qty: 84 | Fill #0

## 2017-01-09 ENCOUNTER — Telehealth: Payer: Self-pay

## 2017-01-09 NOTE — Telephone Encounter (Signed)
Called patient to discuss her enbrel copay assistance. She is currently receiving benefits from the [employer] foundation to help with Enbrel Copay. She has $10.17 left to use from the foundation. Her next refill co-pay will be over $200. Patient would like to apply to the [employer]. She plans to come in on 7/23 to get lab work. We will fill out the application. It will be submitted after she has signed her portion.   Patient voiced understanding and denied any questions at this time.  Katrianna Friesenhahn, [place], CPhT 2:03 PM

## 2017-01-20 ENCOUNTER — Telehealth: Payer: Self-pay

## 2017-01-20 ENCOUNTER — Other Ambulatory Visit: Payer: Self-pay

## 2017-01-20 DIAGNOSIS — Z79631 Long term (current) use of antimetabolite agent: Secondary | ICD-10-CM

## 2017-01-20 DIAGNOSIS — Z79899 Other long term (current) drug therapy: Secondary | ICD-10-CM | POA: Diagnosis not present

## 2017-01-20 LAB — CBC WITH DIFFERENTIAL/PLATELET
Basophils Absolute: 0 cells/uL (ref 0–200)
Basophils Relative: 0 %
Eosinophils Absolute: 160 cells/uL (ref 15–500)
Eosinophils Relative: 2 %
HCT: 39.9 % (ref 35.0–45.0)
Hemoglobin: 13.5 g/dL (ref 11.7–15.5)
Lymphocytes Relative: 22 %
Lymphs Abs: 1760 cells/uL (ref 850–3900)
MCH: 33.7 pg — ABNORMAL HIGH (ref 27.0–33.0)
MCHC: 33.8 g/dL (ref 32.0–36.0)
MCV: 99.5 fL (ref 80.0–100.0)
MPV: 9.5 fL (ref 7.5–12.5)
Monocytes Absolute: 480 cells/uL (ref 200–950)
Monocytes Relative: 6 %
Neutro Abs: 5600 cells/uL (ref 1500–7800)
Neutrophils Relative %: 70 %
Platelets: 264 10*3/uL (ref 140–400)
RBC: 4.01 MIL/uL (ref 3.80–5.10)
RDW: 13.9 % (ref 11.0–15.0)
WBC: 8 10*3/uL (ref 3.8–10.8)

## 2017-01-20 MED ORDER — ENBREL SURECLICK 50 MG/ML ~~LOC~~ SOAJ: 50.0000 mg | SUBCUTANEOUS | 0 refills | Status: DC

## 2017-01-20 MED FILL — ENBREL 50 MG/ML SURECLICK S: 50 | 28 days supply | Qty: 4 | Fill #1

## 2017-01-20 NOTE — Telephone Encounter (Signed)
Last visit: 12/12/16 Next visit: 05/14/17 Labs: 11/19/16 CBC, CMP normal 09/10/16 TB Gold negative  Ok to refill per Dr. Estanislado Pandy.  Provider portion was signed by Dr. Estanislado Pandy and faxed.    [name], Pharm.D., BCPS, CPP Clinical Pharmacist Pager: [phone] Phone: [phone] 01/20/2017 12:22 PM

## 2017-01-20 NOTE — Telephone Encounter (Signed)
Spoke to patient in clinic.The [employer] application has been filled out and signed. Will fax the application to foundation once the Rx has been completed and signed.  Can you generate an Rx for patient? Thanks  Mell Mellott, [place], CPhT 11:24 AM

## 2017-01-20 NOTE — Telephone Encounter (Signed)
Completed application has been faxed to the [place] at [phone]. Will update once we receive a response.   Lenorris Karger, [place], CPhT 12:25 PM

## 2017-01-21 LAB — COMPLETE METABOLIC PANEL WITH GFR
ALT: 16 U/L (ref 6–29)
AST: 17 U/L (ref 10–35)
Albumin: 4.2 g/dL (ref 3.6–5.1)
Alkaline Phosphatase: 72 U/L (ref 33–130)
BUN: 18 mg/dL (ref 7–25)
CO2: 24 mmol/L (ref 20–31)
Calcium: 8.9 mg/dL (ref 8.6–10.4)
Chloride: 104 mmol/L (ref 98–110)
Creat: 0.83 mg/dL (ref 0.50–0.99)
GFR, Est African American: 84 mL/min (ref >=60)
GFR, Est Non African American: 73 mL/min (ref >=60)
Glucose, Bld: 81 mg/dL (ref 65–99)
Potassium: 4.3 mmol/L (ref 3.5–5.3)
Sodium: 139 mmol/L (ref 135–146)
Total Bilirubin: 0.7 mg/dL (ref 0.2–1.2)
Total Protein: 6.6 g/dL (ref 6.1–8.1)

## 2017-01-21 NOTE — Progress Notes (Signed)
WNL

## 2017-01-24 ENCOUNTER — Telehealth: Payer: Self-pay

## 2017-01-24 NOTE — Telephone Encounter (Signed)
Received a fax from [employer] stating that the patient has been approved to receive Enbrel free of charge from 01/23/17 to 06/30/2017.   Will send documents to scan center.   Called patient to inform her of the outcome. Patient states that she received a call from the foundation yesterday. She plans to call them back today to set things up. She will receive her medication through the foundation via mail. Patient voices understanding and denied questions at this time.  Damilola Flamm, [place], CPhT 9:37 AM

## 2017-01-27 ENCOUNTER — Other Ambulatory Visit: Payer: Self-pay | Admitting: Rheumatology

## 2017-01-27 NOTE — Telephone Encounter (Signed)
Last Visit: 12/12/16 Next Visit: 05/14/17  Okay to refill per Dr. Estanislado Pandy

## 2017-02-24 DIAGNOSIS — K219 Gastro-esophageal reflux disease without esophagitis: Secondary | ICD-10-CM | POA: Diagnosis not present

## 2017-02-24 DIAGNOSIS — M797 Fibromyalgia: Secondary | ICD-10-CM | POA: Diagnosis not present

## 2017-02-24 DIAGNOSIS — E039 Hypothyroidism, unspecified: Secondary | ICD-10-CM | POA: Diagnosis not present

## 2017-02-24 DIAGNOSIS — Z0000 Encounter for general adult medical examination without abnormal findings: Secondary | ICD-10-CM | POA: Diagnosis not present

## 2017-02-24 DIAGNOSIS — I10 Essential (primary) hypertension: Secondary | ICD-10-CM | POA: Diagnosis not present

## 2017-02-24 DIAGNOSIS — E7800 Pure hypercholesterolemia, unspecified: Secondary | ICD-10-CM | POA: Diagnosis not present

## 2017-02-24 DIAGNOSIS — M069 Rheumatoid arthritis, unspecified: Secondary | ICD-10-CM | POA: Diagnosis not present

## 2017-03-10 ENCOUNTER — Other Ambulatory Visit: Payer: Self-pay | Admitting: Rheumatology

## 2017-03-10 MED FILL — METHOTREXATE 2.5 MG TABLET: 2.5 | 84 days supply | Qty: 84 | Fill #0

## 2017-03-10 NOTE — Telephone Encounter (Signed)
Last Visit: 12/12/16 Next Visit: 05/14/17 Labs: 01/20/17 WNL   Okay to refill per Dr. Estanislado Pandy

## 2017-03-27 ENCOUNTER — Telehealth: Payer: Self-pay

## 2017-03-27 NOTE — Telephone Encounter (Signed)
Called [employer] foundation to check the process of the 2585 renewal application. Spoke to a representative who states that each patient will be required to submit a full application along with a prior authorization letter. Financial documents are optional, however, may be required later on. Application can be faxed starting on 04/01/2017 for processing. Anything before will be automatically denied. We can fax the application to the foundation for the patient in the clinic.   Called patient to inform. She will come in on Friday, October 5th to sign application. We will submit and update once we receive a response.   Chee Kinslow, [place], CPhT 4:21 PM

## 2017-04-07 DIAGNOSIS — M5136 Other intervertebral disc degeneration, lumbar region: Secondary | ICD-10-CM | POA: Diagnosis not present

## 2017-04-07 DIAGNOSIS — M47816 Spondylosis without myelopathy or radiculopathy, lumbar region: Secondary | ICD-10-CM | POA: Diagnosis not present

## 2017-04-07 NOTE — Telephone Encounter (Signed)
Application has been submitted to [employer]. Will update once we receive a response.  Kari [name], [place], CPhT 1:19 PM

## 2017-04-16 ENCOUNTER — Other Ambulatory Visit: Payer: Self-pay

## 2017-04-16 DIAGNOSIS — Z79631 Long term (current) use of antimetabolite agent: Secondary | ICD-10-CM

## 2017-04-16 DIAGNOSIS — Z79899 Other long term (current) drug therapy: Secondary | ICD-10-CM

## 2017-04-16 LAB — CBC WITH DIFFERENTIAL/PLATELET
Basophils Absolute: 44 cells/uL (ref 0–200)
Basophils Relative: 0.4 %
Eosinophils Absolute: 242 cells/uL (ref 15–500)
Eosinophils Relative: 2.2 %
HCT: 39.2 % (ref 35.0–45.0)
Hemoglobin: 14.1 g/dL (ref 11.7–15.5)
Lymphs Abs: 2167 cells/uL (ref 850–3900)
MCH: 34.9 pg — ABNORMAL HIGH (ref 27.0–33.0)
MCHC: 36 g/dL (ref 32.0–36.0)
MCV: 97 fL (ref 80.0–100.0)
MPV: 9.7 fL (ref 7.5–12.5)
Monocytes Relative: 6.4 %
Neutro Abs: 7843 cells/uL — ABNORMAL HIGH (ref 1500–7800)
Neutrophils Relative %: 71.3 %
Platelets: 275 10*3/uL (ref 140–400)
RBC: 4.04 10*6/uL (ref 3.80–5.10)
RDW: 12.7 % (ref 11.0–15.0)
Total Lymphocyte: 19.7 %
WBC mixed population: 704 cells/uL (ref 200–950)
WBC: 11 10*3/uL — ABNORMAL HIGH (ref 3.8–10.8)

## 2017-04-16 LAB — COMPLETE METABOLIC PANEL WITH GFR
AG Ratio: 2 (calc) (ref 1.0–2.5)
ALT: 17 U/L (ref 6–29)
AST: 13 U/L (ref 10–35)
Albumin: 4.2 g/dL (ref 3.6–5.1)
Alkaline phosphatase (APISO): 67 U/L (ref 33–130)
BUN: 19 mg/dL (ref 7–25)
CO2: 27 mmol/L (ref 20–32)
Calcium: 9 mg/dL (ref 8.6–10.4)
Chloride: 100 mmol/L (ref 98–110)
Creat: 0.82 mg/dL (ref 0.50–0.99)
GFR, Est African American: 85 mL/min/{1.73_m2} (ref >=60)
GFR, Est Non African American: 74 mL/min/{1.73_m2} (ref >=60)
Globulin: 2.1 g/dL (calc) (ref 1.9–3.7)
Glucose, Bld: 81 mg/dL (ref 65–99)
Potassium: 4.2 mmol/L (ref 3.5–5.3)
Sodium: 137 mmol/L (ref 135–146)
Total Bilirubin: 0.9 mg/dL (ref 0.2–1.2)
Total Protein: 6.3 g/dL (ref 6.1–8.1)

## 2017-04-16 NOTE — Progress Notes (Signed)
stable °

## 2017-04-22 NOTE — Telephone Encounter (Signed)
Called foundation to check the status of patient's application. Spoke with a representative who states that the application as been received and is being processed. She could not give me a turnaround time. Will update once we receive a response.   Tahani Potier, [place], CPhT 2:25 PM

## 2017-04-24 DIAGNOSIS — Z23 Encounter for immunization: Secondary | ICD-10-CM | POA: Diagnosis not present

## 2017-04-30 NOTE — Telephone Encounter (Signed)
Received a fax from [employer] stating that the patient has been approved to receive Enbrel free of charge from 07/01/2017 through 06/30/2018.   Case ID: [id number] Phone: [phone]  Will send document to scan center.  Called patient to update. Patient voices understanding and denies any questions at this time.   Norwin Aleman, [place], CPhT 3:19 PM

## 2017-05-04 NOTE — Progress Notes (Signed)
Office Visit Note  Patient: Kari [name]             Date of Birth: 18-Mar-1951           MRN: [id number]             PCP: Aretta Nip, MD Referring: Aretta Nip, MD Visit Date: 05/14/2017 Occupation: @GUAROCC @    Subjective:  Joint stiffness.   History of Present Illness: Kari [name] is a 65 y.o. female with history of sero positive rheumatoid arthritis. She states on current combination of medications she is doing quite well. On overexerting she has some discomfort and swelling in her wrists joints intermittently. She's not having any flares prior to her next Enbrel dose. She saw Dr. Lynann Bologna recently for her lower back pain. She was told that the discomfort is coming from DDD. She was given a prednisone Dosepak for the flare. Left total hip replacement is doing well. She does not have much discomfort from myofascial pain syndrome.  Activities of Daily Living:  Patient reports morning stiffness for 0 minute.   Patient Denies nocturnal pain.  Difficulty dressing/grooming: Denies Difficulty climbing stairs: Denies Difficulty getting out of chair: Reports Difficulty using hands for taps, buttons, cutlery, and/or writing: Denies   Review of Systems  Constitutional: Positive for fatigue. Negative for night sweats, weight gain, weight loss and weakness.  HENT: Negative for mouth sores, trouble swallowing, trouble swallowing, mouth dryness and nose dryness.   Eyes: Positive for dryness. Negative for pain, redness and visual disturbance.  Respiratory: Negative for cough, shortness of breath and difficulty breathing.   Cardiovascular: Positive for palpitations and hypertension. Negative for chest pain, irregular heartbeat and swelling in legs/feet.  Gastrointestinal: Negative for blood in stool, constipation and diarrhea.  Endocrine: Negative for increased urination.  Genitourinary: Negative for vaginal dryness.  Musculoskeletal: Positive for arthralgias and joint pain.  Negative for joint swelling, myalgias, muscle weakness, morning stiffness, muscle tenderness and myalgias.  Skin: Negative for color change, rash, hair loss, skin tightness, ulcers and sensitivity to sunlight.  Allergic/Immunologic: Negative for susceptible to infections.  Neurological: Negative for dizziness, memory loss and night sweats.  Hematological: Negative for swollen glands.  Psychiatric/Behavioral: Positive for depressed mood. Negative for sleep disturbance. The patient is not nervous/anxious.     PMFS History:  Patient Active Problem List   Diagnosis Date Noted  . Hyperlipidemia 07/14/2015  . Palpitation 05/11/2014  . PVC (premature ventricular contraction) 05/11/2014  . Hypokalemia 05/11/2014  . Essential hypertension 05/11/2014  . Mitral regurgitation 05/11/2014  . Arthritis, hip 02/03/2013    Past Medical History:  Diagnosis Date  . Arthritis   . Complication of anesthesia    when block attempted ax -turned red in preop-surg delayed  . Fibromyalgia   . GERD (gastroesophageal reflux disease)   . Hypertension   . Hypothyroidism   . MVP (mitral valve prolapse)   . Osteoporosis   . 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 Heart attack Father   . Colon cancer Unknown   . Colon polyps Unknown   . Hypothyroidism Son    Past Surgical History:  Procedure Laterality Date  . ABDOMINAL HYSTERECTOMY    . CESAREAN SECTION    . CHOLECYSTECTOMY    . COLONOSCOPY    . KNEE ARTHROSCOPY Right 2/11  . WRIST ARTHROSCOPY Right    x2-fusion    . WRIST ARTHROSCOPY Left  Social History   Social History Narrative  . Not on file     Objective: Vital Signs: BP (!) 144/70 (BP Location: Left Arm, Patient Position: Sitting, Cuff Size: Normal)   Pulse 64   Resp 16   Ht 5' 2.5" (1.588 m)   Wt 189 lb (85.7 kg)   BMI 34.02 kg/m    Physical Exam  Constitutional: She is oriented to person, place, and time. She appears  well-developed and well-nourished.  HENT:  Head: Normocephalic and atraumatic.  Eyes: Conjunctivae and EOM are normal.  Neck: Normal range of motion.  Cardiovascular: Normal rate, regular rhythm, normal heart sounds and intact distal pulses.  Pulmonary/Chest: Effort normal and breath sounds normal.  Abdominal: Soft. Bowel sounds are normal.  Lymphadenopathy:    She has no cervical adenopathy.  Neurological: She is alert and oriented to person, place, and time.  Skin: Skin is warm and dry. Capillary refill takes less than 2 seconds.  Psychiatric: She has a normal mood and affect. Her behavior is normal.  Nursing note and vitals reviewed.    Musculoskeletal Exam: C-spine and thoracic lumbar spine good range of motion. She is some discomfort range of motion of her lumbar spine. Shoulder joints elbow joints are good range of motion. Her right wrist joint is fused. She has no MCP PIP or DIP swelling or synovitis. Hip joints knee joints ankles MTPs with good range of motion with no synovitis. She has some crepitus with range of motion of her right knee joint.  CDAI Exam: CDAI Homunculus Exam:   Joint Counts:  CDAI Tender Joint count: 0 CDAI Swollen Joint count: 0  Global Assessments:  Patient Global Assessment: 5 Provider Global Assessment: 3  CDAI Calculated Score: 8    Investigation: No additional findings.Tb Gold: 09/10/2016 Negative  CBC Latest Ref Rng & Units 04/16/2017 01/20/2017 11/19/2016  WBC 3.8 - 10.8 Thousand/uL 11.0(H) 8.0 8.3  Hemoglobin 11.7 - 15.5 g/dL 14.1 13.5 13.9  Hematocrit 35.0 - 45.0 % 39.2 39.9 40.3  Platelets 140 - 400 Thousand/uL 275 264 279   CMP Latest Ref Rng & Units 04/16/2017 01/20/2017 11/19/2016  Glucose 65 - 99 mg/dL 81 81 101(H)  BUN 7 - 25 mg/dL 19 18 13   Creatinine 0.50 - 0.99 mg/dL 0.82 0.83 0.94  Sodium 135 - 146 mmol/L 137 139 140  Potassium 3.5 - 5.3 mmol/L 4.2 4.3 4.1  Chloride 98 - 110 mmol/L 100 104 104  CO2 20 - 32 mmol/L 27 24 23     Calcium 8.6 - 10.4 mg/dL 9.0 8.9 9.2  Total Protein 6.1 - 8.1 g/dL 6.3 6.6 6.5  Total Bilirubin 0.2 - 1.2 mg/dL 0.9 0.7 0.9  Alkaline Phos 33 - 130 U/L - 72 69  AST 10 - 35 U/L 13 17 27   ALT 6 - 29 U/L 17 16 24     Imaging: No results found.  Speciality Comments: No specialty comments available.    Procedures:  No procedures performed Allergies: Clinoril [sulindac]; Erythromycin; Hydrocodone; Penicillins; Sulfa antibiotics; Trimethoprim; and Vancomycin cross reactors   Assessment / Plan:     Visit Diagnoses: Rheumatoid arthritis involving multiple sites with positive rheumatoid factor (HCC) - +RF, +ANA, erosive disease.her disease is quite well-controlled on current regimen. She states she has intermittent joint discomfort with taking certain medications.  High risk medication use - Enbrel 50 mg sq q wk, MTX 7 tabs po qwk, folic acid 2 mg po qd - Plan: CBC with Differential/Platelet, COMPLETE METABOLIC PANEL WITH GFRwas normal  and will have repeat labs in 3 months. I plan to repeat her x-rays next year and August.  History of total hip replacement, left: Intermittent discomfort.  DDD (degenerative disc disease), lumbar: She has chronic pain. She was given a prednisone taper by Dr. Lynann Bologna recently.  Myofascial pain syndrome: Not very active  Age-related osteoporosis without current pathological fracture - s/p forteo, DXA 02/14/2016 T -1.5, BMD 0.687 right femoral neck. She will need repeat DEXA in September 2019.  History of hypertension: Her systolic blood pressure is elevated. I've advised her to monitor that.  Other medical problems are listed as follows:  History of hyperlipidemia  History of hypercholesterolemia  History of hypothyroidism  History of neuropathy    Orders: Orders Placed This Encounter  Procedures  . CBC with Differential/Platelet  . COMPLETE METABOLIC PANEL WITH GFR   No orders of the defined types were placed in this  encounter.   Face-to-face time spent with patient was 30  Minutes. Greater than 50% of time was spent in counseling and coordination of care.  Follow-Up Instructions: Return in about 5 months (around 10/12/2017) for Rheumatoid arthritis.   Bo Merino, MD  Note - This record has been created using Editor, commissioning.  Chart creation errors have been sought, but may not always  have been located. Such creation errors do not reflect on  the standard of medical care.

## 2017-05-14 ENCOUNTER — Ambulatory Visit: Payer: PPO | Admitting: Rheumatology

## 2017-05-14 ENCOUNTER — Encounter: Payer: Self-pay | Admitting: Rheumatology

## 2017-05-14 VITALS — BP 144/70 | HR 64 | Resp 16 | Ht 62.5 in | Wt 189.0 lb

## 2017-05-14 DIAGNOSIS — M810 Age-related osteoporosis without current pathological fracture: Secondary | ICD-10-CM | POA: Diagnosis not present

## 2017-05-14 DIAGNOSIS — Z79899 Other long term (current) drug therapy: Secondary | ICD-10-CM

## 2017-05-14 DIAGNOSIS — Z8669 Personal history of other diseases of the nervous system and sense organs: Secondary | ICD-10-CM | POA: Diagnosis not present

## 2017-05-14 DIAGNOSIS — M5136 Other intervertebral disc degeneration, lumbar region: Secondary | ICD-10-CM

## 2017-05-14 DIAGNOSIS — Z96642 Presence of left artificial hip joint: Secondary | ICD-10-CM

## 2017-05-14 DIAGNOSIS — M0579 Rheumatoid arthritis with rheumatoid factor of multiple sites without organ or systems involvement: Secondary | ICD-10-CM

## 2017-05-14 DIAGNOSIS — Z8639 Personal history of other endocrine, nutritional and metabolic disease: Secondary | ICD-10-CM

## 2017-05-14 DIAGNOSIS — M7918 Myalgia, other site: Secondary | ICD-10-CM

## 2017-05-14 DIAGNOSIS — Z8679 Personal history of other diseases of the circulatory system: Secondary | ICD-10-CM | POA: Diagnosis not present

## 2017-05-14 NOTE — Patient Instructions (Signed)
Standing Labs We placed an order today for your standing lab work.    Please come back and get your standing labs in January and every 3 months TB Gold should be done and April with the following blood draw.  We have open lab Monday through Friday from 8:30-11:30 AM and 1:30-4 PM at the office of Dr. Bo Merino.   The office is located at [address] [place], [place], [place] [address] No appointment is necessary.   Labs are drawn by [facility].  You may receive a bill from [facility] for your lab work. If you have any questions regarding directions or hours of operation,  please call [phone].

## 2017-05-16 ENCOUNTER — Other Ambulatory Visit: Payer: Self-pay | Admitting: Rheumatology

## 2017-05-16 NOTE — Telephone Encounter (Signed)
Last visit: 05/14/2017 Next visit: 10/21/2017  Okay to refill per Dr. Estanislado Pandy.

## 2017-05-21 ENCOUNTER — Other Ambulatory Visit: Payer: Self-pay | Admitting: *Deleted

## 2017-05-21 NOTE — Telephone Encounter (Signed)
Refill request received via fax from [facility] visit: 05/14/2017 Next visit: 10/21/2017 Labs: 04/16/17 Stable TB Gold: 09/10/16 Neg  Okay to refill per Dr. Estanislado Pandy

## 2017-05-26 ENCOUNTER — Telehealth (INDEPENDENT_AMBULATORY_CARE_PROVIDER_SITE_OTHER): Payer: Self-pay

## 2017-05-26 NOTE — Telephone Encounter (Signed)
Patient advised prescription has been faxed to [facility]

## 2017-05-26 NOTE — Telephone Encounter (Signed)
Patient would like a Rx refill on Enbrel.  Stated that [employer] is waiting on a Rx to be faxed and has been without her Enbrel for 2 weeks.  Cb# is [phone].  Please advise.  Thank You.

## 2017-06-02 ENCOUNTER — Other Ambulatory Visit: Payer: Self-pay | Admitting: Rheumatology

## 2017-06-02 MED FILL — METHOTREXATE 2.5 MG TABLET: 2.5 | 84 days supply | Qty: 84 | Fill #0

## 2017-06-02 NOTE — Telephone Encounter (Signed)
Last visit: 05/14/2017 Next visit: 10/21/2017 Labs: 04/16/17 Stable  Okay to refill per Dr. Estanislado Pandy.

## 2017-07-07 ENCOUNTER — Other Ambulatory Visit: Payer: Self-pay | Admitting: Orthopedic Surgery

## 2017-07-07 DIAGNOSIS — M545 Low back pain: Secondary | ICD-10-CM

## 2017-07-07 DIAGNOSIS — M5416 Radiculopathy, lumbar region: Secondary | ICD-10-CM

## 2017-07-07 DIAGNOSIS — M47816 Spondylosis without myelopathy or radiculopathy, lumbar region: Secondary | ICD-10-CM | POA: Diagnosis not present

## 2017-07-08 DIAGNOSIS — H35389 Toxic maculopathy, unspecified eye: Secondary | ICD-10-CM | POA: Diagnosis not present

## 2017-07-08 DIAGNOSIS — Z961 Presence of intraocular lens: Secondary | ICD-10-CM | POA: Diagnosis not present

## 2017-07-08 DIAGNOSIS — H04123 Dry eye syndrome of bilateral lacrimal glands: Secondary | ICD-10-CM | POA: Diagnosis not present

## 2017-07-08 DIAGNOSIS — H43812 Vitreous degeneration, left eye: Secondary | ICD-10-CM | POA: Diagnosis not present

## 2017-07-12 ENCOUNTER — Other Ambulatory Visit: Payer: PPO

## 2017-07-15 ENCOUNTER — Ambulatory Visit
Admission: RE | Admit: 2017-07-15 | Discharge: 2017-07-15 | Disposition: A | Discharge status: Home / Self Care | Payer: PPO | Source: Ambulatory Visit | Attending: Orthopedic Surgery | Admitting: Orthopedic Surgery

## 2017-07-15 DIAGNOSIS — M5416 Radiculopathy, lumbar region: Secondary | ICD-10-CM

## 2017-07-15 DIAGNOSIS — M48061 Spinal stenosis, lumbar region without neurogenic claudication: Secondary | ICD-10-CM | POA: Diagnosis not present

## 2017-07-15 DIAGNOSIS — M545 Low back pain: Secondary | ICD-10-CM

## 2017-07-16 ENCOUNTER — Other Ambulatory Visit: Payer: Self-pay

## 2017-07-16 DIAGNOSIS — Z79899 Other long term (current) drug therapy: Secondary | ICD-10-CM

## 2017-07-17 LAB — COMPLETE METABOLIC PANEL WITH GFR
AG Ratio: 2.3 (calc) (ref 1.0–2.5)
ALT: 37 U/L — ABNORMAL HIGH (ref 6–29)
AST: 33 U/L (ref 10–35)
Albumin: 4.5 g/dL (ref 3.6–5.1)
Alkaline phosphatase (APISO): 74 U/L (ref 33–130)
BUN: 17 mg/dL (ref 7–25)
CO2: 26 mmol/L (ref 20–32)
Calcium: 9.5 mg/dL (ref 8.6–10.4)
Chloride: 105 mmol/L (ref 98–110)
Creat: 0.87 mg/dL (ref 0.50–0.99)
GFR, Est African American: 79 mL/min/{1.73_m2} (ref >=60)
GFR, Est Non African American: 68 mL/min/{1.73_m2} (ref >=60)
Globulin: 2 g/dL (calc) (ref 1.9–3.7)
Glucose, Bld: 95 mg/dL (ref 65–99)
Potassium: 4.1 mmol/L (ref 3.5–5.3)
Sodium: 141 mmol/L (ref 135–146)
Total Bilirubin: 0.9 mg/dL (ref 0.2–1.2)
Total Protein: 6.5 g/dL (ref 6.1–8.1)

## 2017-07-17 LAB — CBC WITH DIFFERENTIAL/PLATELET
Basophils Absolute: 50 cells/uL (ref 0–200)
Basophils Relative: 0.8 %
Eosinophils Absolute: 87 cells/uL (ref 15–500)
Eosinophils Relative: 1.4 %
HCT: 39.1 % (ref 35.0–45.0)
Hemoglobin: 13.5 g/dL (ref 11.7–15.5)
Lymphs Abs: 1085 cells/uL (ref 850–3900)
MCH: 34.2 pg — ABNORMAL HIGH (ref 27.0–33.0)
MCHC: 34.5 g/dL (ref 32.0–36.0)
MCV: 99 fL (ref 80.0–100.0)
MPV: 9.7 fL (ref 7.5–12.5)
Monocytes Relative: 3.9 %
Neutro Abs: 4737 cells/uL (ref 1500–7800)
Neutrophils Relative %: 76.4 %
Platelets: 260 10*3/uL (ref 140–400)
RBC: 3.95 10*6/uL (ref 3.80–5.10)
RDW: 12.4 % (ref 11.0–15.0)
Total Lymphocyte: 17.5 %
WBC mixed population: 242 cells/uL (ref 200–950)
WBC: 6.2 10*3/uL (ref 3.8–10.8)

## 2017-07-17 NOTE — Progress Notes (Signed)
Patient was doing well during the last visit. It is okay to decrease methotrexate to 6 tablets by mouth every week. I believe her LFTs are elevated partly due to use of statins.

## 2017-07-30 DIAGNOSIS — D225 Melanocytic nevi of trunk: Secondary | ICD-10-CM | POA: Diagnosis not present

## 2017-07-30 DIAGNOSIS — Z23 Encounter for immunization: Secondary | ICD-10-CM | POA: Diagnosis not present

## 2017-07-30 DIAGNOSIS — L219 Seborrheic dermatitis, unspecified: Secondary | ICD-10-CM | POA: Diagnosis not present

## 2017-07-30 DIAGNOSIS — L821 Other seborrheic keratosis: Secondary | ICD-10-CM | POA: Diagnosis not present

## 2017-07-30 DIAGNOSIS — Z86018 Personal history of other benign neoplasm: Secondary | ICD-10-CM | POA: Diagnosis not present

## 2017-07-30 DIAGNOSIS — Z85828 Personal history of other malignant neoplasm of skin: Secondary | ICD-10-CM | POA: Diagnosis not present

## 2017-07-30 DIAGNOSIS — Z808 Family history of malignant neoplasm of other organs or systems: Secondary | ICD-10-CM | POA: Diagnosis not present

## 2017-07-30 DIAGNOSIS — D485 Neoplasm of uncertain behavior of skin: Secondary | ICD-10-CM | POA: Diagnosis not present

## 2017-07-31 DIAGNOSIS — H35383 Toxic maculopathy, bilateral: Secondary | ICD-10-CM | POA: Diagnosis not present

## 2017-08-04 DIAGNOSIS — M47816 Spondylosis without myelopathy or radiculopathy, lumbar region: Secondary | ICD-10-CM | POA: Diagnosis not present

## 2017-08-05 DIAGNOSIS — M069 Rheumatoid arthritis, unspecified: Secondary | ICD-10-CM | POA: Diagnosis not present

## 2017-08-05 DIAGNOSIS — N3941 Urge incontinence: Secondary | ICD-10-CM | POA: Diagnosis not present

## 2017-08-05 DIAGNOSIS — E039 Hypothyroidism, unspecified: Secondary | ICD-10-CM | POA: Diagnosis not present

## 2017-08-05 DIAGNOSIS — I10 Essential (primary) hypertension: Secondary | ICD-10-CM | POA: Diagnosis not present

## 2017-08-05 DIAGNOSIS — E7800 Pure hypercholesterolemia, unspecified: Secondary | ICD-10-CM | POA: Diagnosis not present

## 2017-08-08 DIAGNOSIS — M461 Sacroiliitis, not elsewhere classified: Secondary | ICD-10-CM | POA: Diagnosis not present

## 2017-08-08 DIAGNOSIS — M545 Low back pain: Secondary | ICD-10-CM | POA: Diagnosis not present

## 2017-08-11 ENCOUNTER — Other Ambulatory Visit: Payer: Self-pay | Admitting: Rheumatology

## 2017-08-11 ENCOUNTER — Encounter: Payer: Self-pay | Admitting: *Deleted

## 2017-08-11 NOTE — Telephone Encounter (Signed)
Last visit: 05/14/2017 Next visit: 10/21/2017  Last fill: 05/16/2017  Okay to refill per Dr. Estanislado Pandy.

## 2017-08-12 ENCOUNTER — Encounter: Payer: Self-pay | Admitting: Cardiology

## 2017-08-12 ENCOUNTER — Other Ambulatory Visit: Payer: Self-pay | Admitting: *Deleted

## 2017-08-12 ENCOUNTER — Ambulatory Visit: Payer: PPO | Admitting: Cardiology

## 2017-08-12 VITALS — BP 136/80 | HR 57 | Ht 62.5 in | Wt 188.8 lb

## 2017-08-12 DIAGNOSIS — I493 Ventricular premature depolarization: Secondary | ICD-10-CM | POA: Diagnosis not present

## 2017-08-12 DIAGNOSIS — E7800 Pure hypercholesterolemia, unspecified: Secondary | ICD-10-CM | POA: Diagnosis not present

## 2017-08-12 DIAGNOSIS — I10 Essential (primary) hypertension: Secondary | ICD-10-CM | POA: Diagnosis not present

## 2017-08-12 DIAGNOSIS — M069 Rheumatoid arthritis, unspecified: Secondary | ICD-10-CM

## 2017-08-12 MED ORDER — FOLIC ACID 1 MG PO TABS: 2.0000 mg | ORAL_TABLET | Freq: Every day | ORAL | 3 refills | Status: DC

## 2017-08-12 NOTE — Telephone Encounter (Signed)
Refill request received via fax  Last visit: 05/14/2017 Next visit: 10/21/2017  Okay to refill per Dr. Estanislado Pandy

## 2017-08-12 NOTE — Patient Instructions (Signed)
Medication Instructions:  The current medical regimen is effective;  continue present plan and medications. You may try to take your Nadolol in the evening to see if it's beneficial to the treatment of your PVCs.  Follow-Up: Follow up in 1 year with Dr. Marlou Porch.  You will receive a letter in the mail 2 months before you are due.  Please call us when you receive this letter to schedule your follow up appointment.  If you need a refill on your cardiac medications before your next appointment, please call your pharmacy.  Thank you for choosing [facility]!!
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
## 2017-08-15 DIAGNOSIS — Z1231 Encounter for screening mammogram for malignant neoplasm of breast: Secondary | ICD-10-CM | POA: Diagnosis not present

## 2017-08-20 DIAGNOSIS — R922 Inconclusive mammogram: Secondary | ICD-10-CM | POA: Diagnosis not present

## 2017-08-25 DIAGNOSIS — M461 Sacroiliitis, not elsewhere classified: Secondary | ICD-10-CM | POA: Diagnosis not present

## 2017-08-27 ENCOUNTER — Other Ambulatory Visit: Payer: Self-pay | Admitting: Radiology

## 2017-08-27 DIAGNOSIS — N6021 Fibroadenosis of right breast: Secondary | ICD-10-CM | POA: Diagnosis not present

## 2017-08-27 DIAGNOSIS — N6011 Diffuse cystic mastopathy of right breast: Secondary | ICD-10-CM | POA: Diagnosis not present

## 2017-08-29 DIAGNOSIS — M545 Low back pain: Secondary | ICD-10-CM | POA: Diagnosis not present

## 2017-09-01 DIAGNOSIS — M545 Low back pain: Secondary | ICD-10-CM | POA: Diagnosis not present

## 2017-09-02 ENCOUNTER — Other Ambulatory Visit: Payer: Self-pay | Admitting: Rheumatology

## 2017-09-02 MED FILL — METHOTREXATE SODIUM 2.5 MG: 2.5 | 84 days supply | Qty: 72 | Fill #0

## 2017-09-02 NOTE — Telephone Encounter (Signed)
Last visit: 05/14/2017 Next visit: 10/21/2017 Labs: 07/16/17 LFTs elevated  Okay to refill per Dr. Estanislado Pandy

## 2017-09-03 DIAGNOSIS — M545 Low back pain: Secondary | ICD-10-CM | POA: Diagnosis not present

## 2017-09-08 DIAGNOSIS — M545 Low back pain: Secondary | ICD-10-CM | POA: Diagnosis not present

## 2017-09-10 DIAGNOSIS — M545 Low back pain: Secondary | ICD-10-CM | POA: Diagnosis not present

## 2017-09-17 DIAGNOSIS — M545 Low back pain: Secondary | ICD-10-CM | POA: Diagnosis not present

## 2017-09-19 ENCOUNTER — Other Ambulatory Visit: Payer: Self-pay | Admitting: Family Medicine

## 2017-09-19 DIAGNOSIS — R1031 Right lower quadrant pain: Secondary | ICD-10-CM | POA: Diagnosis not present

## 2017-09-29 ENCOUNTER — Ambulatory Visit
Admission: RE | Admit: 2017-09-29 | Discharge: 2017-09-29 | Disposition: A | Discharge status: Home / Self Care | Payer: PPO | Source: Ambulatory Visit | Attending: Family Medicine | Admitting: Family Medicine

## 2017-09-29 DIAGNOSIS — R1031 Right lower quadrant pain: Secondary | ICD-10-CM

## 2017-09-29 MED ORDER — IOPAMIDOL (ISOVUE-300) INJECTION 61%
100.0000 mL | Freq: Once | INTRAVENOUS | Status: AC | PRN
  Administered 2017-09-29: 100 mL via INTRAVENOUS

## 2017-10-07 ENCOUNTER — Other Ambulatory Visit: Payer: Self-pay

## 2017-10-07 ENCOUNTER — Other Ambulatory Visit: Payer: Self-pay | Admitting: *Deleted

## 2017-10-07 DIAGNOSIS — Z79899 Other long term (current) drug therapy: Secondary | ICD-10-CM | POA: Diagnosis not present

## 2017-10-07 DIAGNOSIS — Z9225 Personal history of immunosupression therapy: Secondary | ICD-10-CM | POA: Diagnosis not present

## 2017-10-07 DIAGNOSIS — R1031 Right lower quadrant pain: Secondary | ICD-10-CM | POA: Diagnosis not present

## 2017-10-07 DIAGNOSIS — K5900 Constipation, unspecified: Secondary | ICD-10-CM | POA: Diagnosis not present

## 2017-10-07 NOTE — Progress Notes (Deleted)
Office Visit Note  Patient: Kari [name]             Date of Birth: [birth year]           MRN: [id number]             PCP: Aretta Nip, MD Referring: Aretta Nip, MD Visit Date: 10/21/2017 Occupation: @GUAROCC @    Subjective:  No chief complaint on file.   History of Present Illness: Kari [name] is a 67 y.o. female ***   Activities of Daily Living:  Patient reports morning stiffness for *** {minute/hour:19697}.   Patient {ACTIONS;DENIES/REPORTS:21021675::"Denies"} nocturnal pain.  Difficulty dressing/grooming: {ACTIONS;DENIES/REPORTS:21021675::"Denies"} Difficulty climbing stairs: {ACTIONS;DENIES/REPORTS:21021675::"Denies"} Difficulty getting out of chair: {ACTIONS;DENIES/REPORTS:21021675::"Denies"} Difficulty using hands for taps, buttons, cutlery, and/or writing: {ACTIONS;DENIES/REPORTS:21021675::"Denies"}   No Rheumatology ROS completed.   PMFS History:  Patient Active Problem List   Diagnosis Date Noted  . Hyperlipidemia 07/14/2015  . Palpitation 05/11/2014  . PVC (premature ventricular contraction) 05/11/2014  . Hypokalemia 05/11/2014  . Essential hypertension 05/11/2014  . Mitral regurgitation 05/11/2014  . Arthritis, hip 02/03/2013    Past Medical History:  Diagnosis Date  . Arthritis   . Complication of anesthesia    when block attempted ax -turned red in preop-surg delayed  . Fibromyalgia   . GERD (gastroesophageal reflux disease)   . Hypertension   . Hypothyroidism   . MVP (mitral valve prolapse)   . Osteoporosis   . 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 Heart attack Father   . Colon cancer Unknown   . Colon polyps Unknown   . Hypothyroidism Son    Past Surgical History:  Procedure Laterality Date  . ABDOMINAL HYSTERECTOMY    . CESAREAN SECTION    . CHOLECYSTECTOMY    . COLONOSCOPY    . KNEE ARTHROSCOPY Right 2/11  . TOTAL HIP ARTHROPLASTY Left 02/03/2013   Procedure: TOTAL  HIP ARTHROPLASTY;  Surgeon: Kerin Salen, MD;  Location: [facility];  Service: Orthopedics;  Laterality: Left;  . ULNAR NERVE TRANSPOSITION  09/17/2011   Procedure: ULNAR NERVE DECOMPRESSION/TRANSPOSITION;  Surgeon: Cammie Sickle., MD;  Location: [facility];  Service: Orthopedics;  Laterality: Left;  decompression  . WRIST ARTHROSCOPY Right    x2-fusion    . WRIST ARTHROSCOPY Left    Social History   Social History Narrative  . Not on file     Objective: Vital Signs: There were no vitals taken for this visit.   Physical Exam   Musculoskeletal Exam: ***  CDAI Exam: No CDAI exam completed.    Investigation: No additional findings. CBC Latest Ref Rng & Units 07/16/2017 04/16/2017 01/20/2017  WBC 3.8 - 10.8 Thousand/uL 6.2 11.0(H) 8.0  Hemoglobin 11.7 - 15.5 g/dL 13.5 14.1 13.5  Hematocrit 35.0 - 45.0 % 39.1 39.2 39.9  Platelets 140 - 400 Thousand/uL 260 275 264   CMP Latest Ref Rng & Units 07/16/2017 04/16/2017 01/20/2017  Glucose 65 - 99 mg/dL 95 81 81  BUN 7 - 25 mg/dL 17 19 18   Creatinine 0.50 - 0.99 mg/dL 0.87 0.82 0.83  Sodium 135 - 146 mmol/L 141 137 139  Potassium 3.5 - 5.3 mmol/L 4.1 4.2 4.3  Chloride 98 - 110 mmol/L 105 100 104  CO2 20 - 32 mmol/L 26 27 24   Calcium 8.6 - 10.4 mg/dL 9.5 9.0 8.9  Total Protein 6.1 - 8.1 g/dL 6.5 6.3 6.6  Total  Bilirubin 0.2 - 1.2 mg/dL 0.9 0.9 0.7  Alkaline Phos 33 - 130 U/L - - 72  AST 10 - 35 U/L 33 13 17  ALT 6 - 29 U/L 37(H) 17 16    Imaging: Ct Abdomen Pelvis W Contrast  Result Date: 09/29/2017 CLINICAL DATA:  Right lower quadrant abdominal pain for the past 3 months. EXAM: CT ABDOMEN AND PELVIS WITH CONTRAST TECHNIQUE: Multidetector CT imaging of the abdomen and pelvis was performed using the standard protocol following bolus administration of intravenous contrast. CONTRAST:  163mL ISOVUE-300 IOPAMIDOL (ISOVUE-300) INJECTION 61% COMPARISON:  None. FINDINGS: Lower chest: Limited visualization of lower thorax  demonstrates minimal subsegmental atelectasis within the left costophrenic angle bilateral lung bases. No discrete focal airspace opacities. No pleural effusion. Normal heart size.  No pericardial effusion. Hepatobiliary: Normal hepatic contour. There is a minimal amount of focal fatty infiltration adjacent to the fissure for ligamentum teres. No discrete hepatic lesions. Post cholecystectomy. No intra extrahepatic bili duct dilatation. No ascites. Pancreas: Normal appearance of the pancreas Spleen: Normal appearance of the spleen Adrenals/Urinary Tract: There is symmetric enhancement and excretion of the bilateral kidneys. No definite renal stones on this postcontrast examination. No discrete renal lesions. No urine obstruction or perinephric stranding. Normal appearance the bilateral adrenal glands. Normal appearance of the urinary bladder given degree distention. Stomach/Bowel: The colon is largely underdistended. Unremarkable colonic stool burden. Ingested enteric contrast extends to the level of the cecum and proximal ascending colon. No evidence of enteric obstruction. Normal appearance of the terminal ileum and retrocecal appendix. No pneumoperitoneum, pneumatosis or portal venous gas. Vascular/Lymphatic: Minimal to moderate amount of predominantly calcified slightly irregular atherosclerotic plaque with a normal caliber abdominal aorta, not resulting in hemodynamically significant stenosis. The major branch vessels of the abdominal aorta appear patent on this non CTA examination. No bulky retroperitoneal, mesenteric, pelvic or inguinal lymphadenopathy. Reproductive: Post hysterectomy. No discrete adnexal lesion. No free fluid in the pelvic cul-de-sac. Other: No abdominal wall hernia or abnormality. No abdominopelvic ascites. Musculoskeletal: No acute or aggressive osseous abnormalities. Mild scoliotic curvature of the thoracolumbar spine with dominant caudal component convex the right. Post left total hip  replacement. IMPRESSION: 1. No explanation for patient's right lower quadrant abdominal pain. Specifically, no evidence of enteric or urinary obstruction. Normal appearance of the terminal ileum and retrocecal appendix. 2.  Aortic Atherosclerosis (ICD10-I70.0). Electronically Signed   By: Sandi Mariscal M.D.   On: 09/29/2017 15:50    Speciality Comments: No specialty comments available.    Procedures:  No procedures performed Allergies: Clinoril [sulindac]; Erythromycin; Hydrocodone; Penicillins; Sulfa antibiotics; Trimethoprim; and Vancomycin cross reactors   Assessment / Plan:     Visit Diagnoses: Rheumatoid arthritis involving multiple sites with positive rheumatoid factor (HCC)  High risk medication use - Enbrel 50 mg sq q wk, MTX 7 tabs po qwk, folic acid 2 mg po qd TB gold: Due CBC and CMP: 07/16/17  History of total left hip replacement  DDD (degenerative disc disease), lumbar  Myofascial pain syndrome  Age-related osteoporosis without current pathological fracture  History of hypertension  History of hyperlipidemia  History of hypothyroidism  History of neuropathy    Orders: No orders of the defined types were placed in this encounter.  No orders of the defined types were placed in this encounter.   Face-to-face time spent with patient was *** minutes. 50% of time was spent in counseling and coordination of care.  Follow-Up Instructions: No follow-ups on file.   Ofilia Neas, PA-C  Note - This record has been created using [employer].  Chart creation errors have been sought, but may not always  have been located. Such creation errors do not reflect on  the standard of medical care.

## 2017-10-09 LAB — COMPLETE METABOLIC PANEL WITH GFR
AG Ratio: 2.4 (calc) (ref 1.0–2.5)
ALT: 30 U/L — ABNORMAL HIGH (ref 6–29)
AST: 33 U/L (ref 10–35)
Albumin: 4.5 g/dL (ref 3.6–5.1)
Alkaline phosphatase (APISO): 76 U/L (ref 33–130)
BUN: 14 mg/dL (ref 7–25)
CO2: 26 mmol/L (ref 20–32)
Calcium: 9.2 mg/dL (ref 8.6–10.4)
Chloride: 104 mmol/L (ref 98–110)
Creat: 0.86 mg/dL (ref 0.50–0.99)
GFR, Est African American: 80 mL/min/{1.73_m2} (ref >=60)
GFR, Est Non African American: 69 mL/min/{1.73_m2} (ref >=60)
Globulin: 1.9 g/dL (calc) (ref 1.9–3.7)
Glucose, Bld: 84 mg/dL (ref 65–99)
Potassium: 4.1 mmol/L (ref 3.5–5.3)
Sodium: 140 mmol/L (ref 135–146)
Total Bilirubin: 0.8 mg/dL (ref 0.2–1.2)
Total Protein: 6.4 g/dL (ref 6.1–8.1)

## 2017-10-09 LAB — CBC WITH DIFFERENTIAL/PLATELET
Basophils Absolute: 57 cells/uL (ref 0–200)
Basophils Relative: 0.7 %
Eosinophils Absolute: 148 cells/uL (ref 15–500)
Eosinophils Relative: 1.8 %
HCT: 38.5 % (ref 35.0–45.0)
Hemoglobin: 13.5 g/dL (ref 11.7–15.5)
Lymphs Abs: 1738 cells/uL (ref 850–3900)
MCH: 34.4 pg — ABNORMAL HIGH (ref 27.0–33.0)
MCHC: 35.1 g/dL (ref 32.0–36.0)
MCV: 98 fL (ref 80.0–100.0)
MPV: 9.8 fL (ref 7.5–12.5)
Monocytes Relative: 7.4 %
Neutro Abs: 5650 cells/uL (ref 1500–7800)
Neutrophils Relative %: 68.9 %
Platelets: 264 10*3/uL (ref 140–400)
RBC: 3.93 10*6/uL (ref 3.80–5.10)
RDW: 13.5 % (ref 11.0–15.0)
Total Lymphocyte: 21.2 %
WBC mixed population: 607 cells/uL (ref 200–950)
WBC: 8.2 10*3/uL (ref 3.8–10.8)

## 2017-10-09 LAB — QUANTIFERON-TB GOLD PLUS
Mitogen-NIL: >10 IU/mL
NIL: 0.04 IU/mL
QuantiFERON-TB Gold Plus: NEGATIVE
TB1-NIL: <0 IU/mL
TB2-NIL: 0 IU/mL

## 2017-10-14 DIAGNOSIS — M1711 Unilateral primary osteoarthritis, right knee: Secondary | ICD-10-CM | POA: Diagnosis not present

## 2017-10-20 ENCOUNTER — Other Ambulatory Visit: Payer: Self-pay | Admitting: Cardiology

## 2017-10-21 ENCOUNTER — Ambulatory Visit: Payer: PPO | Admitting: Physician Assistant

## 2017-10-21 ENCOUNTER — Ambulatory Visit: Payer: PPO | Admitting: Rheumatology

## 2017-10-21 DIAGNOSIS — M1711 Unilateral primary osteoarthritis, right knee: Secondary | ICD-10-CM | POA: Diagnosis not present

## 2017-10-21 NOTE — Progress Notes (Signed)
Office Visit Note  Patient: Kari [name]             Date of Birth: [birth year]           MRN: [id number]             PCP: Aretta Nip, MD Referring: Aretta Nip, MD Visit Date: 10/24/2017 Occupation: @GUAROCC @    Subjective:  Right knee pain    History of Present Illness: Kari [name] is a 67 y.o. female seropositive rheumatoid arthritis and DDD.  Patient says she continues to inject Enbrel subcutaneously once a week and takes methotrexate 6 tablets weekly and folic acid 2 mg daily.  Denies any rheumatoid arthritis flares.  She denies any joint pain or joint swelling at this time.  She reports that her left knee replacement is doing well.  She states that after she attended physical therapy for her lower back pain is improved significantly.  She states that she continues to perform stretching exercises at home.  She states that she also walks 20 minutes daily.  She continues to have generalized muscle tenderness and tender points due to her myofascial pain syndrome.  She states the pain is most severe in her intercostal muscles.  She reports the gabapentin does provide some relief.  She states she is also tried CBD oil which did not help with her pain but caused drowsiness.  She reports that she is currently getting gel injections by her orthopedist.  She states that she has one more injection to go out of the series of 3.   Activities of Daily Living:  Patient reports morning stiffness for 0 minutes.   Patient Denies nocturnal pain.  Difficulty dressing/grooming: Denies Difficulty climbing stairs: Denies Difficulty getting out of chair: Denies Difficulty using hands for taps, buttons, cutlery, and/or writing: Denies   Review of Systems  Constitutional: Negative for fatigue.  HENT: Negative for mouth sores, mouth dryness and nose dryness.   Eyes: Positive for dryness. Negative for pain and visual disturbance.  Respiratory: Negative for cough, hemoptysis, shortness  of breath and difficulty breathing.   Cardiovascular: Negative for chest pain, palpitations, hypertension and swelling in legs/feet.  Gastrointestinal: Negative for blood in stool, constipation and diarrhea.  Endocrine: Negative for increased urination.  Genitourinary: Negative for painful urination.  Musculoskeletal: Positive for arthralgias and joint pain. Negative for joint swelling, myalgias, muscle weakness, morning stiffness, muscle tenderness and myalgias.  Skin: Positive for color change. Negative for pallor, rash, hair loss, nodules/bumps, skin tightness, ulcers and sensitivity to sunlight.  Allergic/Immunologic: Negative for susceptible to infections.  Neurological: Negative for dizziness, numbness, headaches and weakness.  Hematological: Negative for swollen glands.  Psychiatric/Behavioral: Positive for depressed mood. Negative for sleep disturbance. The patient is not nervous/anxious.     PMFS History:  Patient Active Problem List   Diagnosis Date Noted  . Hyperlipidemia 07/14/2015  . Palpitation 05/11/2014  . PVC (premature ventricular contraction) 05/11/2014  . Hypokalemia 05/11/2014  . Essential hypertension 05/11/2014  . Mitral regurgitation 05/11/2014  . Arthritis, hip 02/03/2013    Past Medical History:  Diagnosis Date  . Arthritis   . Complication of anesthesia    when block attempted ax -turned red in preop-surg delayed  . Fibromyalgia   . GERD (gastroesophageal reflux disease)   . Hypertension   . Hypothyroidism   . MVP (mitral valve prolapse)   . Osteoporosis   . Rheumatoid aortitis    ? arthritis    Family History  Problem  Relation Age of Onset  . Heart attack Father   . Heart disease Mother   . Stroke Mother   . Colon cancer Unknown   . Colon polyps Unknown   . Hypothyroidism Son    Past Surgical History:  Procedure Laterality Date  . ABDOMINAL HYSTERECTOMY    . CESAREAN SECTION    . CHOLECYSTECTOMY    . COLONOSCOPY    . KNEE ARTHROSCOPY  Right 2/11  . TOTAL HIP ARTHROPLASTY Left 02/03/2013   Procedure: TOTAL HIP ARTHROPLASTY;  Surgeon: Kerin Salen, MD;  Location: [facility];  Service: Orthopedics;  Laterality: Left;  . ULNAR NERVE TRANSPOSITION  09/17/2011   Procedure: ULNAR NERVE DECOMPRESSION/TRANSPOSITION;  Surgeon: Cammie Sickle., MD;  Location: [facility];  Service: Orthopedics;  Laterality: Left;  decompression  . WRIST ARTHROSCOPY Right    x2-fusion    . WRIST ARTHROSCOPY Left    Social History   Social History Narrative  . Not on file     Objective: Vital Signs: BP (!) 156/82 (BP Location: Left Arm, Patient Position: Sitting, Cuff Size: Normal)   Pulse (!) 59   Resp 16   Ht 5' 2.5" (1.588 m)   Wt 193 lb 8 oz (87.8 kg)   BMI 34.83 kg/m    Physical Exam  Constitutional: She is oriented to person, place, and time. She appears well-developed and well-nourished.  HENT:  Head: Normocephalic and atraumatic.  Eyes: Conjunctivae and EOM are normal.  Neck: Normal range of motion.  Cardiovascular: Normal rate, regular rhythm, normal heart sounds and intact distal pulses.  Pulmonary/Chest: Effort normal and breath sounds normal.  Abdominal: Soft. Bowel sounds are normal.  Lymphadenopathy:    She has no cervical adenopathy.  Neurological: She is alert and oriented to person, place, and time.  Skin: Skin is warm and dry. Capillary refill takes less than 2 seconds.  Psychiatric: She has a normal mood and affect. Her behavior is normal.  Nursing note and vitals reviewed.    Musculoskeletal Exam: C-spine, thoracic spine, lumbar spine good range of motion.  No midline spinal tenderness.  No SI joint tenderness.  Shoulder joints, elbow joints, and left wrist joint good range of motion.  Right wrist is fused.  MCPs PIPs and DIPs good range of motion no synovitis.  She has tenderness of her right second PIP joint.  Hip joints, knee joints, ankle joints, MTPs, PIPs, DIPs good range of motion no synovitis.   No warmth or effusion of bilateral knees.  She has bilateral knee crepitus.  No tenderness of trochanteric bursitis.   CDAI Exam: CDAI Homunculus Exam:   Joint Counts:  CDAI Tender Joint count: 0 CDAI Swollen Joint count: 0  Global Assessments:  Patient Global Assessment: 5 Provider Global Assessment: 5  CDAI Calculated Score: 10    Investigation: No additional findings. CBC Latest Ref Rng & Units 10/07/2017 07/16/2017 04/16/2017  WBC 3.8 - 10.8 Thousand/uL 8.2 6.2 11.0(H)  Hemoglobin 11.7 - 15.5 g/dL 13.5 13.5 14.1  Hematocrit 35.0 - 45.0 % 38.5 39.1 39.2  Platelets 140 - 400 Thousand/uL 264 260 275   CMP Latest Ref Rng & Units 10/07/2017 07/16/2017 04/16/2017  Glucose 65 - 99 mg/dL 84 95 81  BUN 7 - 25 mg/dL 14 17 19   Creatinine 0.50 - 0.99 mg/dL 0.86 0.87 0.82  Sodium 135 - 146 mmol/L 140 141 137  Potassium 3.5 - 5.3 mmol/L 4.1 4.1 4.2  Chloride 98 - 110 mmol/L 104 105 100  CO2 20 - 32 mmol/L 26 26 27   Calcium 8.6 - 10.4 mg/dL 9.2 9.5 9.0  Total Protein 6.1 - 8.1 g/dL 6.4 6.5 6.3  Total Bilirubin 0.2 - 1.2 mg/dL 0.8 0.9 0.9  Alkaline Phos 33 - 130 U/L - - -  AST 10 - 35 U/L 33 33 13  ALT 6 - 29 U/L 30(H) 37(H) 17    Imaging: Ct Abdomen Pelvis W Contrast  Result Date: 09/29/2017 CLINICAL DATA:  Right lower quadrant abdominal pain for the past 3 months. EXAM: CT ABDOMEN AND PELVIS WITH CONTRAST TECHNIQUE: Multidetector CT imaging of the abdomen and pelvis was performed using the standard protocol following bolus administration of intravenous contrast. CONTRAST:  146mL ISOVUE-300 IOPAMIDOL (ISOVUE-300) INJECTION 61% COMPARISON:  None. FINDINGS: Lower chest: Limited visualization of lower thorax demonstrates minimal subsegmental atelectasis within the left costophrenic angle bilateral lung bases. No discrete focal airspace opacities. No pleural effusion. Normal heart size.  No pericardial effusion. Hepatobiliary: Normal hepatic contour. There is a minimal amount of focal fatty  infiltration adjacent to the fissure for ligamentum teres. No discrete hepatic lesions. Post cholecystectomy. No intra extrahepatic bili duct dilatation. No ascites. Pancreas: Normal appearance of the pancreas Spleen: Normal appearance of the spleen Adrenals/Urinary Tract: There is symmetric enhancement and excretion of the bilateral kidneys. No definite renal stones on this postcontrast examination. No discrete renal lesions. No urine obstruction or perinephric stranding. Normal appearance the bilateral adrenal glands. Normal appearance of the urinary bladder given degree distention. Stomach/Bowel: The colon is largely underdistended. Unremarkable colonic stool burden. Ingested enteric contrast extends to the level of the cecum and proximal ascending colon. No evidence of enteric obstruction. Normal appearance of the terminal ileum and retrocecal appendix. No pneumoperitoneum, pneumatosis or portal venous gas. Vascular/Lymphatic: Minimal to moderate amount of predominantly calcified slightly irregular atherosclerotic plaque with a normal caliber abdominal aorta, not resulting in hemodynamically significant stenosis. The major branch vessels of the abdominal aorta appear patent on this non CTA examination. No bulky retroperitoneal, mesenteric, pelvic or inguinal lymphadenopathy. Reproductive: Post hysterectomy. No discrete adnexal lesion. No free fluid in the pelvic cul-de-sac. Other: No abdominal wall hernia or abnormality. No abdominopelvic ascites. Musculoskeletal: No acute or aggressive osseous abnormalities. Mild scoliotic curvature of the thoracolumbar spine with dominant caudal component convex the right. Post left total hip replacement. IMPRESSION: 1. No explanation for patient's right lower quadrant abdominal pain. Specifically, no evidence of enteric or urinary obstruction. Normal appearance of the terminal ileum and retrocecal appendix. 2.  Aortic Atherosclerosis (ICD10-I70.0). Electronically Signed   By:  Sandi Mariscal M.D.   On: 09/29/2017 15:50    Speciality Comments: No specialty comments available.    Procedures:  No procedures performed Allergies: Clinoril [sulindac]; Erythromycin; Hydrocodone; Penicillins; Sulfa antibiotics; Trimethoprim; and Vancomycin cross reactors   Assessment / Plan:     Visit Diagnoses: Rheumatoid arthritis with rheumatoid factor of multiple sites without organ or systems involvement (HCC) - +RF, +ANA, erosive disease: She has no synovitis on exam.  No rheumatoid nodulosis noted.  She has no joint pain or joint swelling at this time.  She has not had any recent rheumatoid arthritis flares.  She will continue taking Enbrel 50 mg subcutaneously every week, methotrexate 6 tablets by mouth once a week, and folic acid 2 mg daily.  She does not need any refills at this time.  High risk medication use - Enbrel 50 mg sq q wk, MTX 6 tabs po qwk, folic acid 2 mg po qd TB gold:  4/9/19CBC and CMP: 10/07/17.  She will return in July number 3 months for CBC and CMP to monitor for drug toxicity.  History of total left hip replacement: Doing well.  She has good range of motion on exam.  DDD (degenerative disc disease), lumbar: No midline spinal tenderness.  Her pain has improved significantly since attending physical therapy.  She continues to do stretching and strengthening exercises at home.  Myofascial pain syndrome: She continues to have generalized muscle tenderness and muscle tension.  She has been exercising by walking 30 minutes daily.  She takes gabapentin 300 mg 4 capsules daily.  She does not need a refill at this time.  Her fatigue and insomnia have improved.  She was encouraged to continue to exercise on a regular basis.  Age-related osteoporosis without current pathological fracture - s/p forteo, DXA 02/14/2016 T -1.5, BMD 0.687 right femoral neck. She will need repeat DEXA in September 2019.  Other medical conditions are listed as follows:  History of  hypertension  History of hyperlipidemia  History of hypothyroidism  History of neuropathy    Orders: No orders of the defined types were placed in this encounter.  No orders of the defined types were placed in this encounter.   Face-to-face time spent with patient was 30 minutes. >50% of time was spent in counseling and coordination of care.  Follow-Up Instructions: Return in about 5 months (around 03/26/2018).   Ofilia Neas, PA-C  Note - This record has been created using Dragon software.  Chart creation errors have been sought, but may not always  have been located. Such creation errors do not reflect on  the standard of medical care.

## 2017-10-24 ENCOUNTER — Encounter: Payer: Self-pay | Admitting: Physician Assistant

## 2017-10-24 ENCOUNTER — Ambulatory Visit: Payer: PPO | Admitting: Physician Assistant

## 2017-10-24 VITALS — BP 156/82 | HR 59 | Resp 16 | Ht 62.5 in | Wt 193.5 lb

## 2017-10-24 DIAGNOSIS — Z79899 Other long term (current) drug therapy: Secondary | ICD-10-CM

## 2017-10-24 DIAGNOSIS — Z8679 Personal history of other diseases of the circulatory system: Secondary | ICD-10-CM | POA: Diagnosis not present

## 2017-10-24 DIAGNOSIS — M7918 Myalgia, other site: Secondary | ICD-10-CM

## 2017-10-24 DIAGNOSIS — Z96642 Presence of left artificial hip joint: Secondary | ICD-10-CM | POA: Diagnosis not present

## 2017-10-24 DIAGNOSIS — M0579 Rheumatoid arthritis with rheumatoid factor of multiple sites without organ or systems involvement: Secondary | ICD-10-CM | POA: Diagnosis not present

## 2017-10-24 DIAGNOSIS — Z8669 Personal history of other diseases of the nervous system and sense organs: Secondary | ICD-10-CM

## 2017-10-24 DIAGNOSIS — M5136 Other intervertebral disc degeneration, lumbar region: Secondary | ICD-10-CM

## 2017-10-24 DIAGNOSIS — Z8639 Personal history of other endocrine, nutritional and metabolic disease: Secondary | ICD-10-CM

## 2017-10-24 DIAGNOSIS — M810 Age-related osteoporosis without current pathological fracture: Secondary | ICD-10-CM

## 2017-10-24 NOTE — Patient Instructions (Signed)
Standing Labs We placed an order today for your standing lab work.    Please come back and get your standing labs in July and every 3 months   We have open lab Monday through Friday from 8:30-11:30 AM and 1:30-4:00 PM  at the office of Dr. Shaili Deveshwar.   You may experience shorter wait times on Monday and Friday afternoons. The office is located at [address], [address], [place], [place] [address] No appointment is necessary.   Labs are drawn by [facility].  You may receive a bill from [facility] for your lab work. If you have any questions regarding directions or hours of operation,  please call [phone].    

## 2017-10-28 DIAGNOSIS — M1711 Unilateral primary osteoarthritis, right knee: Secondary | ICD-10-CM | POA: Diagnosis not present

## 2017-11-10 ENCOUNTER — Other Ambulatory Visit: Payer: Self-pay | Admitting: Rheumatology

## 2017-11-10 NOTE — Telephone Encounter (Signed)
Last Visit: 10/24/17 Next Visit: 03/26/18  Okay to refill per Dr. Estanislado Pandy

## 2017-11-25 ENCOUNTER — Other Ambulatory Visit: Payer: Self-pay | Admitting: Rheumatology

## 2017-11-25 DIAGNOSIS — R0781 Pleurodynia: Secondary | ICD-10-CM | POA: Diagnosis not present

## 2017-11-25 MED FILL — METHOTREXATE SODIUM 2.5 MG: 2.5 | 84 days supply | Qty: 72 | Fill #0

## 2017-11-25 NOTE — Telephone Encounter (Signed)
Last visit: 10/24/2017 Next visit: 03/26/2018 Labs: 10/07/2017 stable   Okay to refill per Dr. Estanislado Pandy.

## 2017-11-26 ENCOUNTER — Ambulatory Visit
Admission: RE | Admit: 2017-11-26 | Discharge: 2017-11-26 | Disposition: A | Discharge status: Home / Self Care | Payer: PPO | Source: Ambulatory Visit | Attending: Family Medicine | Admitting: Family Medicine

## 2017-11-26 ENCOUNTER — Other Ambulatory Visit: Payer: Self-pay | Admitting: Family Medicine

## 2017-11-26 DIAGNOSIS — R0781 Pleurodynia: Secondary | ICD-10-CM

## 2017-11-26 DIAGNOSIS — S2232XA Fracture of one rib, left side, initial encounter for closed fracture: Secondary | ICD-10-CM | POA: Diagnosis not present

## 2017-12-22 DIAGNOSIS — M1711 Unilateral primary osteoarthritis, right knee: Secondary | ICD-10-CM | POA: Diagnosis not present

## 2018-01-06 ENCOUNTER — Other Ambulatory Visit: Payer: Self-pay

## 2018-01-06 DIAGNOSIS — Z79899 Other long term (current) drug therapy: Secondary | ICD-10-CM

## 2018-01-07 LAB — CBC WITH DIFFERENTIAL/PLATELET
Basophils Absolute: 27 cells/uL (ref 0–200)
Basophils Relative: 0.3 %
Eosinophils Absolute: 209 cells/uL (ref 15–500)
Eosinophils Relative: 2.3 %
HCT: 41.4 % (ref 35.0–45.0)
Hemoglobin: 14.3 g/dL (ref 11.7–15.5)
Lymphs Abs: 1593 cells/uL (ref 850–3900)
MCH: 34.2 pg — ABNORMAL HIGH (ref 27.0–33.0)
MCHC: 34.5 g/dL (ref 32.0–36.0)
MCV: 99 fL (ref 80.0–100.0)
MPV: 10.2 fL (ref 7.5–12.5)
Monocytes Relative: 7.4 %
Neutro Abs: 6598 cells/uL (ref 1500–7800)
Neutrophils Relative %: 72.5 %
Platelets: 204 10*3/uL (ref 140–400)
RBC: 4.18 10*6/uL (ref 3.80–5.10)
RDW: 12.7 % (ref 11.0–15.0)
Total Lymphocyte: 17.5 %
WBC mixed population: 673 cells/uL (ref 200–950)
WBC: 9.1 10*3/uL (ref 3.8–10.8)

## 2018-01-07 LAB — COMPLETE METABOLIC PANEL WITH GFR
AG Ratio: 2 (calc) (ref 1.0–2.5)
ALT: 24 U/L (ref 6–29)
AST: 19 U/L (ref 10–35)
Albumin: 3.8 g/dL (ref 3.6–5.1)
Alkaline phosphatase (APISO): 67 U/L (ref 33–130)
BUN: 19 mg/dL (ref 7–25)
CO2: 25 mmol/L (ref 20–32)
Calcium: 8.6 mg/dL (ref 8.6–10.4)
Chloride: 101 mmol/L (ref 98–110)
Creat: 0.89 mg/dL (ref 0.50–0.99)
GFR, Est African American: 77 mL/min/{1.73_m2} (ref >=60)
GFR, Est Non African American: 67 mL/min/{1.73_m2} (ref >=60)
Globulin: 1.9 g/dL (calc) (ref 1.9–3.7)
Glucose, Bld: 94 mg/dL (ref 65–99)
Potassium: 3.8 mmol/L (ref 3.5–5.3)
Sodium: 136 mmol/L (ref 135–146)
Total Bilirubin: 0.6 mg/dL (ref 0.2–1.2)
Total Protein: 5.7 g/dL — ABNORMAL LOW (ref 6.1–8.1)

## 2018-01-14 DIAGNOSIS — M25561 Pain in right knee: Secondary | ICD-10-CM | POA: Diagnosis not present

## 2018-02-03 DIAGNOSIS — H40013 Open angle with borderline findings, low risk, bilateral: Secondary | ICD-10-CM | POA: Diagnosis not present

## 2018-02-03 DIAGNOSIS — H35383 Toxic maculopathy, bilateral: Secondary | ICD-10-CM | POA: Diagnosis not present

## 2018-02-03 DIAGNOSIS — I10 Essential (primary) hypertension: Secondary | ICD-10-CM | POA: Diagnosis not present

## 2018-02-03 DIAGNOSIS — H18413 Arcus senilis, bilateral: Secondary | ICD-10-CM | POA: Diagnosis not present

## 2018-02-06 ENCOUNTER — Other Ambulatory Visit: Payer: Self-pay | Admitting: Rheumatology

## 2018-02-06 NOTE — Telephone Encounter (Signed)
Last visit: 10/24/2017 Next visit: 03/26/2018 Labs: 01/06/2018 Total protein is low. MCH stable. All other labs are WNL.  Okay to refill per Dr. Estanislado Pandy.

## 2018-02-06 NOTE — Telephone Encounter (Signed)
Last visit: 10/24/2017 Next visit: 03/26/2018  Okay to refill per Dr. Estanislado Pandy.

## 2018-02-10 MED FILL — METHOTREXATE SODIUM 2.5 MG: 2.5 | 84 days supply | Qty: 72 | Fill #0

## 2018-03-03 ENCOUNTER — Encounter: Payer: Self-pay | Admitting: Rheumatology

## 2018-03-03 DIAGNOSIS — R922 Inconclusive mammogram: Secondary | ICD-10-CM | POA: Diagnosis not present

## 2018-03-03 DIAGNOSIS — M8589 Other specified disorders of bone density and structure, multiple sites: Secondary | ICD-10-CM | POA: Diagnosis not present

## 2018-03-04 DIAGNOSIS — E039 Hypothyroidism, unspecified: Secondary | ICD-10-CM | POA: Diagnosis not present

## 2018-03-04 DIAGNOSIS — I10 Essential (primary) hypertension: Secondary | ICD-10-CM | POA: Diagnosis not present

## 2018-03-04 DIAGNOSIS — M797 Fibromyalgia: Secondary | ICD-10-CM | POA: Diagnosis not present

## 2018-03-04 DIAGNOSIS — S0990XA Unspecified injury of head, initial encounter: Secondary | ICD-10-CM | POA: Diagnosis not present

## 2018-03-04 DIAGNOSIS — Z0000 Encounter for general adult medical examination without abnormal findings: Secondary | ICD-10-CM | POA: Diagnosis not present

## 2018-03-04 DIAGNOSIS — M069 Rheumatoid arthritis, unspecified: Secondary | ICD-10-CM | POA: Diagnosis not present

## 2018-03-04 DIAGNOSIS — E7800 Pure hypercholesterolemia, unspecified: Secondary | ICD-10-CM | POA: Diagnosis not present

## 2018-03-05 ENCOUNTER — Other Ambulatory Visit: Payer: Self-pay | Admitting: Family Medicine

## 2018-03-05 DIAGNOSIS — S0990XA Unspecified injury of head, initial encounter: Secondary | ICD-10-CM

## 2018-03-09 ENCOUNTER — Telehealth: Payer: Self-pay | Admitting: *Deleted

## 2018-03-09 NOTE — Telephone Encounter (Signed)
Bone Density Scan Results T-Score -1.3  S/P Forteo and Fosamx  Improved. Continue Calcium and Vitamin D   patient advised of results and recommendations.

## 2018-03-11 ENCOUNTER — Ambulatory Visit
Admission: RE | Admit: 2018-03-11 | Discharge: 2018-03-11 | Disposition: A | Discharge status: Home / Self Care | Payer: PPO | Source: Ambulatory Visit | Attending: Family Medicine | Admitting: Family Medicine

## 2018-03-11 DIAGNOSIS — S0990XA Unspecified injury of head, initial encounter: Secondary | ICD-10-CM | POA: Diagnosis not present

## 2018-03-12 NOTE — Progress Notes (Deleted)
Office Visit Note  Patient: Kari [name]             Date of Birth: 04/09/1951           MRN: [id number]             PCP: Aretta Nip, MD Referring: Aretta Nip, MD Visit Date: 03/26/2018 Occupation: @GUAROCC @  Subjective:  No chief complaint on file.   History of Present Illness: Kari [name] is a 67 y.o. female ***   Activities of Daily Living:  Patient reports morning stiffness for *** {minute/hour:19697}.   Patient {ACTIONS;DENIES/REPORTS:21021675::"Denies"} nocturnal pain.  Difficulty dressing/grooming: {ACTIONS;DENIES/REPORTS:21021675::"Denies"} Difficulty climbing stairs: {ACTIONS;DENIES/REPORTS:21021675::"Denies"} Difficulty getting out of chair: {ACTIONS;DENIES/REPORTS:21021675::"Denies"} Difficulty using hands for taps, buttons, cutlery, and/or writing: {ACTIONS;DENIES/REPORTS:21021675::"Denies"}  No Rheumatology ROS completed.   PMFS History:  Patient Active Problem List   Diagnosis Date Noted  . Hyperlipidemia 07/14/2015  . Palpitation 05/11/2014  . PVC (premature ventricular contraction) 05/11/2014  . Hypokalemia 05/11/2014  . Essential hypertension 05/11/2014  . Mitral regurgitation 05/11/2014  . Arthritis, hip 02/03/2013    Past Medical History:  Diagnosis Date  . Arthritis   . Complication of anesthesia    when block attempted ax -turned red in preop-surg delayed  . Fibromyalgia   . GERD (gastroesophageal reflux disease)   . Hypertension   . Hypothyroidism   . MVP (mitral valve prolapse)   . Osteoporosis   . 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 Heart attack Father   . Heart disease Mother   . Stroke Mother   . Colon cancer Unknown   . Colon polyps Unknown   . Hypothyroidism Son    Past Surgical History:  Procedure Laterality Date  . ABDOMINAL HYSTERECTOMY    . CESAREAN SECTION    . CHOLECYSTECTOMY    . COLONOSCOPY    . KNEE ARTHROSCOPY Right 2/11  . TOTAL HIP  ARTHROPLASTY Left 02/03/2013   Procedure: TOTAL HIP ARTHROPLASTY;  Surgeon: Kerin Salen, MD;  Location: [facility];  Service: Orthopedics;  Laterality: Left;  . ULNAR NERVE TRANSPOSITION  09/17/2011   Procedure: ULNAR NERVE DECOMPRESSION/TRANSPOSITION;  Surgeon: Cammie Sickle., MD;  Location: [facility];  Service: Orthopedics;  Laterality: Left;  decompression  . WRIST ARTHROSCOPY Right    x2-fusion    . WRIST ARTHROSCOPY Left    Social History   Social History Narrative  . Not on file    Objective: Vital Signs: There were no vitals taken for this visit.   Physical Exam   Musculoskeletal Exam: ***  CDAI Exam: CDAI Score: Not documented Patient Global Assessment: Not documented; Provider Global Assessment: Not documented Swollen: Not documented; Tender: Not documented Joint Exam   Not documented   There is currently no information documented on the homunculus. Go to the Rheumatology activity and complete the homunculus joint exam.  Investigation: No additional findings.  Imaging: Ct Head Wo Contrast  Result Date: 03/12/2018 CLINICAL DATA:  Fall striking the back of the head on 02/25/2018. EXAM: CT HEAD WITHOUT CONTRAST TECHNIQUE: Contiguous axial images were obtained from the base of the skull through the vertex without intravenous contrast. COMPARISON:  02/27/2011 FINDINGS: Brain: The brainstem, cerebellum, cerebral peduncles, thalami, basal ganglia, basilar cisterns, and ventricular system appear within normal limits. No intracranial hemorrhage, mass lesion, or acute CVA. Vascular: Unremarkable Skull: Unremarkable Sinuses/Orbits: Unremarkable Other: No supplemental non-categorized findings. IMPRESSION: 1.  No significant  abnormality identified. Electronically Signed   By: Van Clines M.D.   On: 03/12/2018 08:00    Recent Labs: Lab Results  Component Value Date   WBC 9.1 01/06/2018   HGB 14.3 01/06/2018   PLT 204 01/06/2018   NA 136 01/06/2018   K  3.8 01/06/2018   CL 101 01/06/2018   CO2 25 01/06/2018   GLUCOSE 94 01/06/2018   BUN 19 01/06/2018   CREATININE 0.89 01/06/2018   BILITOT 0.6 01/06/2018   ALKPHOS 72 01/20/2017   AST 19 01/06/2018   ALT 24 01/06/2018   PROT 5.7 (L) 01/06/2018   ALBUMIN 4.2 01/20/2017   CALCIUM 8.6 01/06/2018   GFRAA 77 01/06/2018   QFTBGOLDPLUS NEGATIVE 10/07/2017    Speciality Comments: No specialty comments available.  Procedures:  No procedures performed Allergies: Clinoril [sulindac]; Erythromycin; Hydrocodone; Penicillins; Sulfa antibiotics; Trimethoprim; and Vancomycin cross reactors   Assessment / Plan:     Visit Diagnoses: Rheumatoid arthritis with rheumatoid factor of multiple sites without organ or systems involvement (HCC) - +RF, +ANA, erosive disease  High risk medication use - Enbrel 50 mg sq q wk, MTX 6 tabs po qwk, folic acid 2 mg po qd TB gold: 10/07/17  History of total left hip replacement  DDD (degenerative disc disease), lumbar  Myofascial pain syndrome  Age-related osteoporosis without current pathological fracture  History of hypertension  History of hyperlipidemia  History of hypothyroidism  History of neuropathy   Orders: No orders of the defined types were placed in this encounter.  No orders of the defined types were placed in this encounter.   Face-to-face time spent with patient was *** minutes. Greater than 50% of time was spent in counseling and coordination of care.  Follow-Up Instructions: No follow-ups on file.   Ofilia Neas, PA-C  Note - This record has been created using Dragon software.  Chart creation errors have been sought, but may not always  have been located. Such creation errors do not reflect on  the standard of medical care.

## 2018-03-26 ENCOUNTER — Ambulatory Visit: Payer: PPO | Admitting: Physician Assistant

## 2018-03-27 NOTE — Progress Notes (Signed)
Office Visit Note  Patient: Kari [name]             Date of Birth: [birth year]           MRN: [id number]             PCP: Aretta Nip, MD Referring: Aretta Nip, MD Visit Date: 04/01/2018 Occupation: @GUAROCC @  Subjective:  Medication management  History of Present Illness: Kari [name] is a 67 y.o. female with history of seropositive rheumatoid arthritis, osteopenia, and myofascial pain syndrome.  She is on Enbrel 50 mg sq injections once a week, MTX 6 tablets by mouth once a week, and folic acid 2 mg po daily.  She denies any recent rheumatoid arthritis flares.  Denies any joint pain or joint swelling at this time.  She denies any morning stiffness.  She states that she had her last Enbrel injection today.  She states that her fibromyalgia has been flaring more frequently.  She denies muscle aches muscle tenderness.  She has been using CBD cream topically which she does not feel they are effective.  She states that she recently went on a 15-day bus trip and fell as she was on the trip and hit her head.  She has been having headaches and nausea off and on since the fall.  Headaches have improved this week.  She states that she did follow-up with Dr. Zadie Rhine who ordered a CT of the head which was normal.  Patient recently had a bone density scan that revealed a T score of -1.3.  She was previously on Fosamax and then Forteo.  She continues to take calcium and vitamin D on a daily basis.   Activities of Daily Living:  Patient reports morning stiffness for 0 minutes.   Patient Reports nocturnal pain.  Difficulty dressing/grooming: Denies Difficulty climbing stairs: Reports Difficulty getting out of chair: Denies Difficulty using hands for taps, buttons, cutlery, and/or writing: Denies  Review of Systems  Constitutional: Positive for fatigue.  HENT: Positive for mouth sores. Negative for mouth dryness and nose dryness.   Eyes: Positive for dryness. Negative for pain and  visual disturbance.  Respiratory: Negative for cough, hemoptysis, shortness of breath and difficulty breathing.   Cardiovascular: Negative for chest pain, palpitations, hypertension and swelling in legs/feet.  Gastrointestinal: Negative for blood in stool, constipation and diarrhea.  Endocrine: Negative for increased urination.  Genitourinary: Negative for painful urination.  Musculoskeletal: Positive for myalgias, muscle tenderness and myalgias. Negative for arthralgias, joint pain, joint swelling, muscle weakness and morning stiffness.  Skin: Negative for color change, pallor, rash, hair loss, nodules/bumps, skin tightness, ulcers and sensitivity to sunlight.  Allergic/Immunologic: Negative for susceptible to infections.  Neurological: Negative for dizziness, numbness, headaches and weakness.  Hematological: Negative for swollen glands.  Psychiatric/Behavioral: Negative for depressed mood and sleep disturbance. The patient is not nervous/anxious.     PMFS History:  Patient Active Problem List   Diagnosis Date Noted  . Hyperlipidemia 07/14/2015  . Palpitation 05/11/2014  . PVC (premature ventricular contraction) 05/11/2014  . Hypokalemia 05/11/2014  . Essential hypertension 05/11/2014  . Mitral regurgitation 05/11/2014  . Arthritis, hip 02/03/2013    Past Medical History:  Diagnosis Date  . Arthritis   . Complication of anesthesia    when block attempted ax -turned red in preop-surg delayed  . Fibromyalgia   . GERD (gastroesophageal reflux disease)   . Hypertension   . Hypothyroidism   . MVP (mitral valve prolapse)   .  Osteoporosis   . 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 Heart attack Father   . Heart disease Mother   . Stroke Mother   . Colon cancer Unknown   . Colon polyps Unknown   . Hypothyroidism Son    Past Surgical History:  Procedure Laterality Date  . ABDOMINAL HYSTERECTOMY    . CESAREAN SECTION    .  CHOLECYSTECTOMY    . COLONOSCOPY    . KNEE ARTHROSCOPY Right 2/11  . TOTAL HIP ARTHROPLASTY Left 02/03/2013   Procedure: TOTAL HIP ARTHROPLASTY;  Surgeon: Kerin Salen, MD;  Location: [place];  Service: Orthopedics;  Laterality: Left;  . ULNAR NERVE TRANSPOSITION  09/17/2011   Procedure: ULNAR NERVE DECOMPRESSION/TRANSPOSITION;  Surgeon: Cammie Sickle., MD;  Location: [facility];  Service: Orthopedics;  Laterality: Left;  decompression  . WRIST ARTHROSCOPY Right    x2-fusion    . WRIST ARTHROSCOPY Left    Social History   Social History Narrative  . Not on file    Objective: Vital Signs: BP (!) 152/75 (BP Location: Left Arm, Patient Position: Sitting, Cuff Size: Normal)   Pulse (!) 57   Resp 14   Ht 5\' 3"  (1.6 m)   Wt 192 lb 3.2 oz (87.2 kg)   BMI 34.05 kg/m    Physical Exam  Constitutional: She is oriented to person, place, and time. She appears well-developed and well-nourished.  HENT:  Head: Normocephalic and atraumatic.  Eyes: Conjunctivae and EOM are normal.  Neck: Normal range of motion.  Cardiovascular: Normal rate, regular rhythm, normal heart sounds and intact distal pulses.  Pulmonary/Chest: Effort normal and breath sounds normal.  Abdominal: Soft. Bowel sounds are normal.  Lymphadenopathy:    She has no cervical adenopathy.  Neurological: She is alert and oriented to person, place, and time.  Skin: Skin is warm and dry. Capillary refill takes less than 2 seconds.  Psychiatric: She has a normal mood and affect. Her behavior is normal.  Nursing note and vitals reviewed.    Musculoskeletal Exam: C-spine, thoracic spine, and lumbar spine good ROM.  Shoulder joints, elbow joints, left wrist, MCPs, PIPs, and DIPs good ROM with no synovitis.  Complete fist formation. Right wrist fusion.  Hip joints, knee joints, ankle joints, MTPs, PIPs, and DIPs good ROM with no synovitis.  No warmth or effusion of knee joints.  No tenderness of trochanteric bursa  bilaterally.   CDAI Exam: CDAI Score: 0.8  Patient Global Assessment: 4 (mm); Provider Global Assessment: 4 (mm) Swollen: 0 ; Tender: 0  Joint Exam   Not documented   There is currently no information documented on the homunculus. Go to the Rheumatology activity and complete the homunculus joint exam.  Investigation: No additional findings.  Imaging: Ct Head Wo Contrast  Result Date: 03/12/2018 CLINICAL DATA:  Fall striking the back of the head on 02/25/2018. EXAM: CT HEAD WITHOUT CONTRAST TECHNIQUE: Contiguous axial images were obtained from the base of the skull through the vertex without intravenous contrast. COMPARISON:  02/27/2011 FINDINGS: Brain: The brainstem, cerebellum, cerebral peduncles, thalami, basal ganglia, basilar cisterns, and ventricular system appear within normal limits. No intracranial hemorrhage, mass lesion, or acute CVA. Vascular: Unremarkable Skull: Unremarkable Sinuses/Orbits: Unremarkable Other: No supplemental non-categorized findings. IMPRESSION: 1.  No significant abnormality identified. Electronically Signed   By: Van Clines M.D.   On: 03/12/2018 08:00    Recent Labs: Lab Results  Component Value Date   WBC 9.1 01/06/2018   HGB 14.3 01/06/2018   PLT 204 01/06/2018   NA 136 01/06/2018   K 3.8 01/06/2018   CL 101 01/06/2018   CO2 25 01/06/2018   GLUCOSE 94 01/06/2018   BUN 19 01/06/2018   CREATININE 0.89 01/06/2018   BILITOT 0.6 01/06/2018   ALKPHOS 72 01/20/2017   AST 19 01/06/2018   ALT 24 01/06/2018   PROT 5.7 (L) 01/06/2018   ALBUMIN 4.2 01/20/2017   CALCIUM 8.6 01/06/2018   GFRAA 77 01/06/2018   QFTBGOLDPLUS NEGATIVE 10/07/2017    Speciality Comments: Recieves Enbrel through [employer] Program  Procedures:  No procedures performed Allergies: Clinoril [sulindac]; Erythromycin; Hydrocodone; Penicillins; Sulfa antibiotics; Trimethoprim; and Vancomycin cross reactors   Assessment / Plan:     Visit Diagnoses: Rheumatoid  arthritis with rheumatoid factor of multiple sites without organ or systems involvement (HCC) - +RF, +ANA, erosive disease: She has no synovitis on exam.  She has not had any recent rheumatoid arthritis flares.  She is clinically doing well on Enbrel subcutaneous injections once weekly methotrexate 6 tablets by mouth once weekly.  She does not need any refills today.  Her most recent Enbrel injection was today.  She will continue on this current treatment regimen.  She was advised to notify us if she develops increased joint pain or joint swelling.  She will follow-up in the office in 5 months.  High risk medication use - Enbrel 50 mg sq q wk, MTX 6 tabs po qwk, folic acid 2 mg po qd. CBC and CMP will be drawn today to monitor for drug toxicity.  She will return in January and every 3 months for lab work.- Plan: CBC with Differential/Platelet, COMPLETE METABOLIC PANEL WITH GFR  History of total left hip replacement: Doing well.  She has no discomfort with range of motion at this time.  DDD (degenerative disc disease), lumbar: No midline spinal tenderness.  Good range of motion.  Myofascial pain syndrome: She has been having increased muscle aches and muscle tenderness.  She continues to have chronic fatigue.  She recently got back from a 15-day bus trip and feels as though she is still recovering.  While in this vacation she fell and hit her head.  She has had headache and nausea off and on since the fall.  She followed up with Dr. Zadie Rhine who ordered a CT of the head which was unremarkable according to the patient.  Osteopenia of multiple sites: Patient recently had a bone density scan that revealed a T score of -1.3.  The results of the bone density scan will be scanned in.  The date of this scan was not recorded in epic.  She was previously on Fosamax and then Forteo.  She was advised to continue taking calcium and vitamin D on a daily basis.  The importance of resistive exercises were  discussed.  Other medical conditions are listed as follows:  History of neuropathy  History of hyperlipidemia  History of hypertension  History of hypothyroidism   Orders: Orders Placed This Encounter  Procedures  . CBC with Differential/Platelet  . COMPLETE METABOLIC PANEL WITH GFR   No orders of the defined types were placed in this encounter.   Face-to-face time spent with patient was 30 minutes. Greater than 50% of time was spent in counseling and coordination of care.  Follow-Up Instructions: Return in about 5 months (around 08/31/2018) for Rheumatoid arthritis, Fibromyalgia.   Ofilia Neas, PA-C  I examined and evaluated the patient with [name] PA.  Patient had no synovitis on examination.  She is doing well on combination of methotrexate and Enbrel.  She does have some synovial thickening.  She continues to have some discomfort from underlying myofascial pain syndrome.  The plan of care was discussed as noted above.  Bo Merino, MD Note - This record has been created using Editor, commissioning.  Chart creation errors have been sought, but may not always  have been located. Such creation errors do not reflect on  the standard of medical care.

## 2018-04-01 ENCOUNTER — Telehealth: Payer: Self-pay | Admitting: Pharmacy Technician

## 2018-04-01 ENCOUNTER — Encounter: Payer: Self-pay | Admitting: Physician Assistant

## 2018-04-01 ENCOUNTER — Ambulatory Visit: Payer: PPO | Admitting: Rheumatology

## 2018-04-01 VITALS — BP 152/75 | HR 57 | Resp 14 | Ht 63.0 in | Wt 192.2 lb

## 2018-04-01 DIAGNOSIS — Z8679 Personal history of other diseases of the circulatory system: Secondary | ICD-10-CM | POA: Diagnosis not present

## 2018-04-01 DIAGNOSIS — Z79899 Other long term (current) drug therapy: Secondary | ICD-10-CM

## 2018-04-01 DIAGNOSIS — Z96642 Presence of left artificial hip joint: Secondary | ICD-10-CM

## 2018-04-01 DIAGNOSIS — Z8639 Personal history of other endocrine, nutritional and metabolic disease: Secondary | ICD-10-CM | POA: Diagnosis not present

## 2018-04-01 DIAGNOSIS — M8589 Other specified disorders of bone density and structure, multiple sites: Secondary | ICD-10-CM | POA: Diagnosis not present

## 2018-04-01 DIAGNOSIS — M0579 Rheumatoid arthritis with rheumatoid factor of multiple sites without organ or systems involvement: Secondary | ICD-10-CM | POA: Diagnosis not present

## 2018-04-01 DIAGNOSIS — M7918 Myalgia, other site: Secondary | ICD-10-CM | POA: Diagnosis not present

## 2018-04-01 DIAGNOSIS — M51369 Other intervertebral disc degeneration, lumbar region without mention of lumbar back pain or lower extremity pain: Secondary | ICD-10-CM

## 2018-04-01 DIAGNOSIS — M5136 Other intervertebral disc degeneration, lumbar region: Secondary | ICD-10-CM

## 2018-04-01 DIAGNOSIS — Z8669 Personal history of other diseases of the nervous system and sense organs: Secondary | ICD-10-CM | POA: Diagnosis not present

## 2018-04-01 LAB — CBC WITH DIFFERENTIAL/PLATELET
Basophils Absolute: 58 cells/uL (ref 0–200)
Basophils Relative: 1 %
Eosinophils Absolute: 139 cells/uL (ref 15–500)
Eosinophils Relative: 2.4 %
HCT: 38 % (ref 35.0–45.0)
Hemoglobin: 13.1 g/dL (ref 11.7–15.5)
Lymphs Abs: 1531 cells/uL (ref 850–3900)
MCH: 34.4 pg — ABNORMAL HIGH (ref 27.0–33.0)
MCHC: 34.5 g/dL (ref 32.0–36.0)
MCV: 99.7 fL (ref 80.0–100.0)
MPV: 9.7 fL (ref 7.5–12.5)
Monocytes Relative: 8.5 %
Neutro Abs: 3579 cells/uL (ref 1500–7800)
Neutrophils Relative %: 61.7 %
Platelets: 256 10*3/uL (ref 140–400)
RBC: 3.81 10*6/uL (ref 3.80–5.10)
RDW: 13.1 % (ref 11.0–15.0)
Total Lymphocyte: 26.4 %
WBC mixed population: 493 cells/uL (ref 200–950)
WBC: 5.8 10*3/uL (ref 3.8–10.8)

## 2018-04-01 LAB — COMPLETE METABOLIC PANEL WITH GFR
AG Ratio: 2.2 (calc) (ref 1.0–2.5)
ALT: 25 U/L (ref 6–29)
AST: 21 U/L (ref 10–35)
Albumin: 4.3 g/dL (ref 3.6–5.1)
Alkaline phosphatase (APISO): 66 U/L (ref 33–130)
BUN: 14 mg/dL (ref 7–25)
CO2: 26 mmol/L (ref 20–32)
Calcium: 9.1 mg/dL (ref 8.6–10.4)
Chloride: 104 mmol/L (ref 98–110)
Creat: 0.87 mg/dL (ref 0.50–0.99)
GFR, Est African American: 79 mL/min/{1.73_m2} (ref >=60)
GFR, Est Non African American: 68 mL/min/{1.73_m2} (ref >=60)
Globulin: 2 g/dL (calc) (ref 1.9–3.7)
Glucose, Bld: 92 mg/dL (ref 65–99)
Potassium: 4.2 mmol/L (ref 3.5–5.3)
Sodium: 139 mmol/L (ref 135–146)
Total Bilirubin: 0.6 mg/dL (ref 0.2–1.2)
Total Protein: 6.3 g/dL (ref 6.1–8.1)

## 2018-04-01 NOTE — Telephone Encounter (Signed)
Faxed Renewal Application to [employer]. Awaiting response.  Will send document to scan center.  Phone# [phone] Fax# [phone]  1:44 PM Beatriz Chancellor, CPhT

## 2018-04-02 NOTE — Progress Notes (Signed)
CBC stable. CMP WNL.

## 2018-04-20 ENCOUNTER — Telehealth: Payer: Self-pay | Admitting: Pharmacy Technician

## 2018-04-20 NOTE — Telephone Encounter (Signed)
Received a Prior Authorization request from [employer] for Enbrel. Authorization has been submitted to patient's insurance via Cover My Meds. Will update once we receive a response.  Received response that med is available without authorization.  8:30 AM Beatriz Chancellor, CPhT

## 2018-04-21 DIAGNOSIS — Z23 Encounter for immunization: Secondary | ICD-10-CM | POA: Diagnosis not present

## 2018-04-21 DIAGNOSIS — R110 Nausea: Secondary | ICD-10-CM | POA: Diagnosis not present

## 2018-04-27 ENCOUNTER — Telehealth: Payer: Self-pay | Admitting: Rheumatology

## 2018-04-27 MED ORDER — ENBREL SURECLICK 50 MG/ML ~~LOC~~ SOAJ: 50.0000 mg | SUBCUTANEOUS | 0 refills | Status: DC

## 2018-04-27 NOTE — Telephone Encounter (Signed)
A representative from [employer] called requesting a new prescription for patient's Enbrel to be faxed to [phone] of you can call [phone] with verbal confirmation.

## 2018-04-27 NOTE — Telephone Encounter (Signed)
Spoke to patient, she is going on vacation this Saturday, 05/02/18. Advised her that we received refill request today and for her to call office if she hasn't scheduled shipment wit [employer] by Wednesday afternoon.

## 2018-04-27 NOTE — Telephone Encounter (Signed)
Last visit: 04/01/18 Next visit: 08/31/18 Labs: 04/01/18 CBC stable. CMP WNL. TB gold: 10/07/17 Neg   Okay to refill per Dr. Estanislado Pandy

## 2018-04-27 NOTE — Addendum Note (Signed)
Addended by: Carole Binning on: 04/27/2018 03:31 PM   Modules accepted: Orders

## 2018-05-09 ENCOUNTER — Other Ambulatory Visit: Payer: Self-pay | Admitting: Rheumatology

## 2018-05-11 ENCOUNTER — Other Ambulatory Visit: Payer: Self-pay | Admitting: Rheumatology

## 2018-05-11 DIAGNOSIS — M722 Plantar fascial fibromatosis: Secondary | ICD-10-CM | POA: Diagnosis not present

## 2018-05-11 MED FILL — METHOTREXATE SODIUM 2.5 MG: 2.5 | 84 days supply | Qty: 72 | Fill #0

## 2018-05-11 NOTE — Telephone Encounter (Signed)
Last Visit: 04/01/18 Next Visit: 08/31/18 Labs: 04/01/18 CBC stable. CMP WNL.  Okay to refill per Dr. Estanislado Pandy

## 2018-05-11 NOTE — Telephone Encounter (Signed)
Next visit: 04/01/18 Last Visit: 09/18/18  Okay to refill per Dr. Estanislado Pandy

## 2018-06-02 NOTE — Telephone Encounter (Signed)
Received a fax from [employer] regarding a prior authorization for Enbrel. Authorization has been APPROVED from 07/01/18 to 07/01/19.   Will send document to scan center.  Phone # [phone]  8:27 AM Beatriz Chancellor, CPhT

## 2018-06-08 NOTE — Telephone Encounter (Signed)
Received fax from [employer], renewal application has been APPROVED. Coverage is from 07/01/2018 to 07/01/2019.  Will send document to scan Center.  Phone# [phone] Fax# [phone]  8:51 AM Beatriz Chancellor, CPhT

## 2018-06-15 DIAGNOSIS — S93491A Sprain of other ligament of right ankle, initial encounter: Secondary | ICD-10-CM | POA: Diagnosis not present

## 2018-06-15 DIAGNOSIS — S92344A Nondisplaced fracture of fourth metatarsal bone, right foot, initial encounter for closed fracture: Secondary | ICD-10-CM | POA: Diagnosis not present

## 2018-06-29 DIAGNOSIS — M25571 Pain in right ankle and joints of right foot: Secondary | ICD-10-CM | POA: Diagnosis not present

## 2018-07-13 DIAGNOSIS — M25571 Pain in right ankle and joints of right foot: Secondary | ICD-10-CM | POA: Diagnosis not present

## 2018-07-23 ENCOUNTER — Encounter: Payer: Self-pay | Admitting: Cardiology

## 2018-08-09 ENCOUNTER — Other Ambulatory Visit: Payer: Self-pay | Admitting: Rheumatology

## 2018-08-10 ENCOUNTER — Other Ambulatory Visit: Payer: Self-pay | Admitting: Cardiology

## 2018-08-10 MED ORDER — SPIRONOLACTONE 25 MG PO TABS: ORAL_TABLET | ORAL | 0 refills | Status: DC

## 2018-08-10 NOTE — Telephone Encounter (Signed)
Next visit: 04/01/18 Last Visit: 09/18/18  Okay to refill per Dr. Estanislado Pandy

## 2018-08-10 NOTE — Telephone Encounter (Signed)
Pt's medication was sent to pt's pharmacy as requested. Confirmation received.  °

## 2018-08-10 NOTE — Telephone Encounter (Signed)
New Message     *STAT* If patient is at the pharmacy, call can be transferred to refill team.   1. Which medications need to be refilled? (please list name of each medication and dose if known) Spironolactone 25mg    2. Which pharmacy/location (including street and city if local pharmacy) is medication to be sent to? [facility]   3. Do they need a 30 day or 90 day supply? 30 day

## 2018-08-12 ENCOUNTER — Ambulatory Visit: Payer: PPO | Admitting: Cardiology

## 2018-08-17 NOTE — Progress Notes (Signed)
Office Visit Note  Patient: Kari [name]             Date of Birth: 1950/12/19           MRN: [id number]             PCP: Aretta Nip, MD Referring: Aretta Nip, MD Visit Date: 08/31/2018 Occupation: @GUAROCC @  Subjective:  Right knee joint pain   History of Present Illness: Kari [name] is a 68 y.o. female with history of seropositive rheumatoid arthritis, DDD, and myofascial pain syndrome.  She is prescribed Enbrel 50 mg sq injections once weekly, MTX 6 tablets po once weekly and folic acid 2 mg po daily.  She was off of Enbrel for 1 month and restarted last week to due to traveling and then getting an infection.  She reports she has been experiencing nausea the day after taking MTX.  She reports she fell in December 2019 due to her right ankle giving out on her.  She was evaluated by Dr. Lynann Bologna and was placed in a boot and a cast until recently.  She states she fell last week due to her boot catching on the pavement.  She landed on both hands and is having some discomfort.  She denies any recent RA flares.  She denies any joint swelling.  She states she is having constant right knee joint pain.  She has had several cortisone injections by Dr. Lynann Bologna in the past.  She denies any right knee joint swelling or warmth.   Activities of Daily Living:  Patient reports morning stiffness for 0 minutes.   Patient Denies nocturnal pain.  Difficulty dressing/grooming: Denies Difficulty climbing stairs: Reports Difficulty getting out of chair: Reports Difficulty using hands for taps, buttons, cutlery, and/or writing: Denies  Review of Systems  Constitutional: Positive for fatigue.  HENT: Negative for mouth sores, mouth dryness and nose dryness.   Eyes: Positive for dryness. Negative for pain and visual disturbance.  Respiratory: Negative for cough, hemoptysis, shortness of breath and difficulty breathing.   Cardiovascular: Negative for chest pain, palpitations, hypertension and  swelling in legs/feet.  Gastrointestinal: Negative for blood in stool, constipation and diarrhea.  Endocrine: Negative for increased urination.  Genitourinary: Negative for painful urination.  Musculoskeletal: Positive for arthralgias, joint pain and muscle tenderness. Negative for joint swelling, myalgias, muscle weakness, morning stiffness and myalgias.  Skin: Negative for color change, pallor, rash, hair loss, nodules/bumps, skin tightness, ulcers and sensitivity to sunlight.  Allergic/Immunologic: Negative for susceptible to infections.  Neurological: Negative for dizziness, numbness, headaches and weakness.  Hematological: Negative for swollen glands.  Psychiatric/Behavioral: Negative for depressed mood and sleep disturbance. The patient is not nervous/anxious.     PMFS History:  Patient Active Problem List   Diagnosis Date Noted  . Hyperlipidemia 07/14/2015  . Palpitation 05/11/2014  . PVC (premature ventricular contraction) 05/11/2014  . Hypokalemia 05/11/2014  . Essential hypertension 05/11/2014  . Mitral regurgitation 05/11/2014  . Arthritis, hip 02/03/2013    Past Medical History:  Diagnosis Date  . Arthritis   . Complication of anesthesia    when block attempted ax -turned red in preop-surg delayed  . Fibromyalgia   . GERD (gastroesophageal reflux disease)   . Hypertension   . Hypothyroidism   . MVP (mitral valve prolapse)   . Osteoporosis   . 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 Heart attack Father   .  Heart disease Mother   . Stroke Mother   . Colon cancer Other   . Colon polyps Other   . Hypothyroidism Son    Past Surgical History:  Procedure Laterality Date  . ABDOMINAL HYSTERECTOMY    . CESAREAN SECTION    . CHOLECYSTECTOMY    . COLONOSCOPY    . KNEE ARTHROSCOPY Right 2/11  . TOTAL HIP ARTHROPLASTY Left 02/03/2013   Procedure: TOTAL HIP ARTHROPLASTY;  Surgeon: Kerin Salen, MD;  Location: [facility];   Service: Orthopedics;  Laterality: Left;  . ULNAR NERVE TRANSPOSITION  09/17/2011   Procedure: ULNAR NERVE DECOMPRESSION/TRANSPOSITION;  Surgeon: Cammie Sickle., MD;  Location: [facility];  Service: Orthopedics;  Laterality: Left;  decompression  . WRIST ARTHROSCOPY Right    x2-fusion    . WRIST ARTHROSCOPY Left    Social History   Social History Narrative  . Not on file    There is no immunization history on file for this patient.   Objective: Vital Signs: BP (!) 146/65 (BP Location: Left Arm, Patient Position: Sitting, Cuff Size: Large)   Pulse (!) 53   Resp 13   Ht 5\' 3"  (1.6 m)   Wt 188 lb 12.8 oz (85.6 kg)   BMI 33.44 kg/m    Physical Exam Vitals signs and nursing note reviewed.  Constitutional:      Appearance: She is well-developed.  HENT:     Head: Normocephalic and atraumatic.  Eyes:     Conjunctiva/sclera: Conjunctivae normal.  Neck:     Musculoskeletal: Normal range of motion.  Cardiovascular:     Rate and Rhythm: Normal rate and regular rhythm.     Heart sounds: Normal heart sounds.  Pulmonary:     Effort: Pulmonary effort is normal.     Breath sounds: Normal breath sounds.  Abdominal:     General: Bowel sounds are normal.     Palpations: Abdomen is soft.  Lymphadenopathy:     Cervical: No cervical adenopathy.  Skin:    General: Skin is warm and dry.     Capillary Refill: Capillary refill takes less than 2 seconds.  Neurological:     Mental Status: She is alert and oriented to person, place, and time.  Psychiatric:        Behavior: Behavior normal.      Musculoskeletal Exam: C-spine, thoracic spine, and lumbar spine good ROM.  No midline spinal tenderness.  No SI joint tenderness.  Shoulder joints and elbow joints good ROM with no synovitis.   Right wrist fused.  Left wrist good ROM.  MCPs, PIPs, and DIPs good ROM with no synovitis.  Complete fist formation.  Left hip replacement good ROM with no discomfort.  Right hip good ROM.   Knee joints, ankle joints, MTPs, PIPs, and DIPs good ROM with no synovitis.  No warmth or effusion of knee joints.     CDAI Exam: CDAI Score: Not documented Patient Global Assessment: Not documented; Provider Global Assessment: Not documented Swollen: Not documented; Tender: Not documented Joint Exam   Not documented   There is currently no information documented on the homunculus. Go to the Rheumatology activity and complete the homunculus joint exam.  Investigation: No additional findings.  Imaging: No results found.  Recent Labs: Lab Results  Component Value Date   WBC 7.0 08/18/2018   HGB 14.3 08/18/2018   PLT 272 08/18/2018   NA 139 08/18/2018   K 4.2 08/18/2018   CL 104 08/18/2018   CO2  27 08/18/2018   GLUCOSE 91 08/18/2018   BUN 18 08/18/2018   CREATININE 0.76 08/18/2018   BILITOT 0.8 08/18/2018   ALKPHOS 72 01/20/2017   AST 27 08/18/2018   ALT 25 08/18/2018   PROT 6.7 08/18/2018   ALBUMIN 4.2 01/20/2017   CALCIUM 9.3 08/18/2018   GFRAA 93 08/18/2018   QFTBGOLDPLUS NEGATIVE 10/07/2017    Speciality Comments: Recieves Enbrel through [employer] [facility]  Procedures:  No procedures performed Allergies: Clinoril [sulindac]; Erythromycin; Hydrocodone; Penicillins; Sulfa antibiotics; Trimethoprim; and Vancomycin cross reactors   Assessment / Plan:     Visit Diagnoses: Rheumatoid arthritis with rheumatoid factor of multiple sites without organ or systems involvement (HCC) - +RF, +ANA, erosive disease: She has no synovitis on exam.  She is not had any recent rheumatoid arthritis flares.  She has been experiencing right knee joint pain.  No warmth or effusion of the knee joint was noted.  She was recently off of Enbrel for 1 month due to traveling as well as recent infection.  She resumed Enbrel last week.  She continues to take methotrexate 6 tablets by mouth once weekly and folic acid 2 mg by mouth daily.  She has been experiencing nausea the day after taking  methotrexate on a weekly basis.  We discussed trying to take Zofran on the following day but she declined a prescription at this time.  She states that she does have an antinausea medication at home but is unsure of the name.  She is advised to notify us if she would like a refill.  We also discussed switching her to the injectable form of methotrexate to reduce GI side effects.  She declined at this time.  She will continue on Enbrel 50 mg weekly injections, methotrexate 6 tablets by mouth once weekly, and folic acid 2 mg by mouth daily.  She was advised to notify us if she develops increased joint pain or joint swelling.  High risk medication use - Enbrel SureClick 50 mg weekly, methotrexate 6 tablets weekly, and folic acid 2 mg daily.  Last TB gold negative on 10/07/2017 and will monitor yearly. Future order for TB gold placed today. Most recent CBC/CMP within normal limits on 08/18/2018.  Will monitor every 3 months and standing orders are in place.- Plan: QuantiFERON-TB Gold Plus  History of total left hip replacement: Doing well.  She has no discomfort at this time.   DDD (degenerative disc disease), lumbar: No midline spinal tenderness.  Good ROM with no discomfort.   Myofascial pain syndrome: She has occasional myalgias and muscle tenderness. She has chronic fatigue.   Osteopenia of multiple sites - Patient recently had a bone density scan that revealed a T score of -1.3. She was previously on Fosamax and then Forteo.   Frequent falls: She reports that she fell in December 2019 due to her right ankle giving out on her.  She is evaluated by Dr. Lynann Bologna.  She was placed in a cast followed by a boot.  The boot caught the pavement last week and she fell again.  She has noticed increased muscle weakness in both lower extremities.  We discussed the importance of physical therapy for fall prevention and lower extremity muscle strengthening.  A referral be placed today.  Other medical conditions are listed  as follows:   History of hypertension  History of neuropathy  History of hypothyroidism  History of hyperlipidemia   Orders: Orders Placed This Encounter  Procedures  . QuantiFERON-TB Gold Plus  No orders of the defined types were placed in this encounter.    Follow-Up Instructions: Return in about 5 months (around 01/31/2019) for Rheumatoid arthritis, DDD.   Ofilia Neas, PA-C  Note - This record has been created using Dragon software.  Chart creation errors have been sought, but may not always  have been located. Such creation errors do not reflect on  the standard of medical care.

## 2018-08-18 ENCOUNTER — Telehealth: Payer: Self-pay

## 2018-08-18 ENCOUNTER — Other Ambulatory Visit: Payer: Self-pay

## 2018-08-18 DIAGNOSIS — Z79899 Other long term (current) drug therapy: Secondary | ICD-10-CM | POA: Diagnosis not present

## 2018-08-18 DIAGNOSIS — H40013 Open angle with borderline findings, low risk, bilateral: Secondary | ICD-10-CM | POA: Diagnosis not present

## 2018-08-18 DIAGNOSIS — H18413 Arcus senilis, bilateral: Secondary | ICD-10-CM | POA: Diagnosis not present

## 2018-08-18 DIAGNOSIS — H35383 Toxic maculopathy, bilateral: Secondary | ICD-10-CM | POA: Diagnosis not present

## 2018-08-18 DIAGNOSIS — Z961 Presence of intraocular lens: Secondary | ICD-10-CM | POA: Diagnosis not present

## 2018-08-18 MED ORDER — METHOTREXATE SODIUM 2.5 MG PO TABS: 15.0000 mg | ORAL_TABLET | ORAL | 0 refills | Status: DC

## 2018-08-18 MED ORDER — ENBREL SURECLICK 50 MG/ML ~~LOC~~ SOAJ: 50.0000 mg | SUBCUTANEOUS | 0 refills | Status: DC

## 2018-08-18 NOTE — Telephone Encounter (Signed)
Patient presented in the office for labs today and requested a refill of Enbrel to be sent to [facility] and MTX to be sent to [facility].   Last Visit: 04/01/2018 Next Visit: 08/31/2018 Labs: 08/18/2018 pending. 04/01/2018 CBC stable. CMP WNL.  Okay to refill per Dr. Estanislado Pandy.

## 2018-08-19 LAB — CBC WITH DIFFERENTIAL/PLATELET
Absolute Monocytes: 644 cells/uL (ref 200–950)
Basophils Absolute: 63 cells/uL (ref 0–200)
Basophils Relative: 0.9 %
Eosinophils Absolute: 126 cells/uL (ref 15–500)
Eosinophils Relative: 1.8 %
HCT: 40.8 % (ref 35.0–45.0)
Hemoglobin: 14.3 g/dL (ref 11.7–15.5)
Lymphs Abs: 1925 cells/uL (ref 850–3900)
MCH: 34 pg — ABNORMAL HIGH (ref 27.0–33.0)
MCHC: 35 g/dL (ref 32.0–36.0)
MCV: 96.9 fL (ref 80.0–100.0)
MPV: 10.2 fL (ref 7.5–12.5)
Monocytes Relative: 9.2 %
Neutro Abs: 4242 cells/uL (ref 1500–7800)
Neutrophils Relative %: 60.6 %
Platelets: 272 10*3/uL (ref 140–400)
RBC: 4.21 10*6/uL (ref 3.80–5.10)
RDW: 12.7 % (ref 11.0–15.0)
Total Lymphocyte: 27.5 %
WBC: 7 10*3/uL (ref 3.8–10.8)

## 2018-08-19 LAB — COMPLETE METABOLIC PANEL WITH GFR
AG Ratio: 1.9 (calc) (ref 1.0–2.5)
ALT: 25 U/L (ref 6–29)
AST: 27 U/L (ref 10–35)
Albumin: 4.4 g/dL (ref 3.6–5.1)
Alkaline phosphatase (APISO): 72 U/L (ref 37–153)
BUN: 18 mg/dL (ref 7–25)
CO2: 27 mmol/L (ref 20–32)
Calcium: 9.3 mg/dL (ref 8.6–10.4)
Chloride: 104 mmol/L (ref 98–110)
Creat: 0.76 mg/dL (ref 0.50–0.99)
GFR, Est African American: 93 mL/min/{1.73_m2} (ref >=60)
GFR, Est Non African American: 80 mL/min/{1.73_m2} (ref >=60)
Globulin: 2.3 g/dL (calc) (ref 1.9–3.7)
Glucose, Bld: 91 mg/dL (ref 65–99)
Potassium: 4.2 mmol/L (ref 3.5–5.3)
Sodium: 139 mmol/L (ref 135–146)
Total Bilirubin: 0.8 mg/dL (ref 0.2–1.2)
Total Protein: 6.7 g/dL (ref 6.1–8.1)

## 2018-08-21 DIAGNOSIS — S63501A Unspecified sprain of right wrist, initial encounter: Secondary | ICD-10-CM | POA: Diagnosis not present

## 2018-08-21 DIAGNOSIS — Z1231 Encounter for screening mammogram for malignant neoplasm of breast: Secondary | ICD-10-CM | POA: Diagnosis not present

## 2018-08-25 DIAGNOSIS — Z86018 Personal history of other benign neoplasm: Secondary | ICD-10-CM | POA: Diagnosis not present

## 2018-08-25 DIAGNOSIS — L309 Dermatitis, unspecified: Secondary | ICD-10-CM | POA: Diagnosis not present

## 2018-08-25 DIAGNOSIS — Z808 Family history of malignant neoplasm of other organs or systems: Secondary | ICD-10-CM | POA: Diagnosis not present

## 2018-08-25 DIAGNOSIS — L821 Other seborrheic keratosis: Secondary | ICD-10-CM | POA: Diagnosis not present

## 2018-08-25 DIAGNOSIS — D225 Melanocytic nevi of trunk: Secondary | ICD-10-CM | POA: Diagnosis not present

## 2018-08-25 DIAGNOSIS — Z23 Encounter for immunization: Secondary | ICD-10-CM | POA: Diagnosis not present

## 2018-08-25 DIAGNOSIS — Z85828 Personal history of other malignant neoplasm of skin: Secondary | ICD-10-CM | POA: Diagnosis not present

## 2018-08-31 ENCOUNTER — Ambulatory Visit: Payer: PPO | Admitting: Physician Assistant

## 2018-08-31 ENCOUNTER — Encounter: Payer: Self-pay | Admitting: Physician Assistant

## 2018-08-31 ENCOUNTER — Other Ambulatory Visit: Payer: Self-pay

## 2018-08-31 VITALS — BP 146/65 | HR 53 | Resp 13 | Ht 63.0 in | Wt 188.8 lb

## 2018-08-31 DIAGNOSIS — M5136 Other intervertebral disc degeneration, lumbar region: Secondary | ICD-10-CM | POA: Diagnosis not present

## 2018-08-31 DIAGNOSIS — M7918 Myalgia, other site: Secondary | ICD-10-CM | POA: Diagnosis not present

## 2018-08-31 DIAGNOSIS — R296 Repeated falls: Secondary | ICD-10-CM

## 2018-08-31 DIAGNOSIS — Z8669 Personal history of other diseases of the nervous system and sense organs: Secondary | ICD-10-CM | POA: Diagnosis not present

## 2018-08-31 DIAGNOSIS — Z79899 Other long term (current) drug therapy: Secondary | ICD-10-CM

## 2018-08-31 DIAGNOSIS — Z8639 Personal history of other endocrine, nutritional and metabolic disease: Secondary | ICD-10-CM

## 2018-08-31 DIAGNOSIS — Z8679 Personal history of other diseases of the circulatory system: Secondary | ICD-10-CM

## 2018-08-31 DIAGNOSIS — M8589 Other specified disorders of bone density and structure, multiple sites: Secondary | ICD-10-CM | POA: Diagnosis not present

## 2018-08-31 DIAGNOSIS — Z96642 Presence of left artificial hip joint: Secondary | ICD-10-CM

## 2018-08-31 DIAGNOSIS — M0579 Rheumatoid arthritis with rheumatoid factor of multiple sites without organ or systems involvement: Secondary | ICD-10-CM | POA: Diagnosis not present

## 2018-08-31 NOTE — Patient Instructions (Signed)
Standing Labs We placed an order today for your standing lab work.    Please come back and get your standing labs in May and every 3 months  We have open lab Monday through Friday from 8:30-11:30 AM and 1:30-4:00 PM  at the office of Dr. Shaili Deveshwar.   You may experience shorter wait times on Monday and Friday afternoons. The office is located at [address], [address], [place], [place] [address] No appointment is necessary.   Labs are drawn by [facility].  You may receive a bill from [facility] for your lab work.  If you wish to have your labs drawn at another location, please call the office 24 hours in advance to send orders.  If you have any questions regarding directions or hours of operation,  please call [phone].   Just as a reminder please drink plenty of water prior to coming for your lab work. Thanks!  

## 2018-09-04 DIAGNOSIS — M6281 Muscle weakness (generalized): Secondary | ICD-10-CM | POA: Diagnosis not present

## 2018-09-04 DIAGNOSIS — R296 Repeated falls: Secondary | ICD-10-CM | POA: Diagnosis not present

## 2018-09-04 DIAGNOSIS — R262 Difficulty in walking, not elsewhere classified: Secondary | ICD-10-CM | POA: Diagnosis not present

## 2018-09-08 DIAGNOSIS — M25561 Pain in right knee: Secondary | ICD-10-CM | POA: Diagnosis not present

## 2018-09-11 DIAGNOSIS — M6281 Muscle weakness (generalized): Secondary | ICD-10-CM | POA: Diagnosis not present

## 2018-09-11 DIAGNOSIS — R262 Difficulty in walking, not elsewhere classified: Secondary | ICD-10-CM | POA: Diagnosis not present

## 2018-09-11 DIAGNOSIS — R296 Repeated falls: Secondary | ICD-10-CM | POA: Diagnosis not present

## 2018-09-16 DIAGNOSIS — R296 Repeated falls: Secondary | ICD-10-CM | POA: Diagnosis not present

## 2018-09-16 DIAGNOSIS — R262 Difficulty in walking, not elsewhere classified: Secondary | ICD-10-CM | POA: Diagnosis not present

## 2018-09-16 DIAGNOSIS — M6281 Muscle weakness (generalized): Secondary | ICD-10-CM | POA: Diagnosis not present

## 2018-09-18 DIAGNOSIS — R262 Difficulty in walking, not elsewhere classified: Secondary | ICD-10-CM | POA: Diagnosis not present

## 2018-09-18 DIAGNOSIS — R296 Repeated falls: Secondary | ICD-10-CM | POA: Diagnosis not present

## 2018-09-18 DIAGNOSIS — M6281 Muscle weakness (generalized): Secondary | ICD-10-CM | POA: Diagnosis not present

## 2018-09-23 DIAGNOSIS — R296 Repeated falls: Secondary | ICD-10-CM | POA: Diagnosis not present

## 2018-09-23 DIAGNOSIS — M6281 Muscle weakness (generalized): Secondary | ICD-10-CM | POA: Diagnosis not present

## 2018-09-23 DIAGNOSIS — M25561 Pain in right knee: Secondary | ICD-10-CM | POA: Diagnosis not present

## 2018-09-23 DIAGNOSIS — R262 Difficulty in walking, not elsewhere classified: Secondary | ICD-10-CM | POA: Diagnosis not present

## 2018-09-25 DIAGNOSIS — R296 Repeated falls: Secondary | ICD-10-CM | POA: Diagnosis not present

## 2018-09-25 DIAGNOSIS — M6281 Muscle weakness (generalized): Secondary | ICD-10-CM | POA: Diagnosis not present

## 2018-09-25 DIAGNOSIS — R262 Difficulty in walking, not elsewhere classified: Secondary | ICD-10-CM | POA: Diagnosis not present

## 2018-09-30 DIAGNOSIS — M6281 Muscle weakness (generalized): Secondary | ICD-10-CM | POA: Diagnosis not present

## 2018-09-30 DIAGNOSIS — N400 Enlarged prostate without lower urinary tract symptoms: Secondary | ICD-10-CM | POA: Diagnosis not present

## 2018-09-30 DIAGNOSIS — L89154 Pressure ulcer of sacral region, stage 4: Secondary | ICD-10-CM | POA: Diagnosis not present

## 2018-09-30 DIAGNOSIS — R296 Repeated falls: Secondary | ICD-10-CM | POA: Diagnosis not present

## 2018-09-30 DIAGNOSIS — D649 Anemia, unspecified: Secondary | ICD-10-CM | POA: Diagnosis not present

## 2018-09-30 DIAGNOSIS — R262 Difficulty in walking, not elsewhere classified: Secondary | ICD-10-CM | POA: Diagnosis not present

## 2018-10-02 DIAGNOSIS — R040 Epistaxis: Secondary | ICD-10-CM | POA: Diagnosis not present

## 2018-10-02 DIAGNOSIS — S6722XA Crushing injury of left hand, initial encounter: Secondary | ICD-10-CM | POA: Diagnosis not present

## 2018-10-02 DIAGNOSIS — R262 Difficulty in walking, not elsewhere classified: Secondary | ICD-10-CM | POA: Diagnosis not present

## 2018-10-02 DIAGNOSIS — S6292XA Unspecified fracture of left wrist and hand, initial encounter for closed fracture: Secondary | ICD-10-CM | POA: Diagnosis not present

## 2018-10-02 DIAGNOSIS — M6281 Muscle weakness (generalized): Secondary | ICD-10-CM | POA: Diagnosis not present

## 2018-10-02 DIAGNOSIS — R296 Repeated falls: Secondary | ICD-10-CM | POA: Diagnosis not present

## 2018-10-06 DIAGNOSIS — M6281 Muscle weakness (generalized): Secondary | ICD-10-CM | POA: Diagnosis not present

## 2018-10-06 DIAGNOSIS — R296 Repeated falls: Secondary | ICD-10-CM | POA: Diagnosis not present

## 2018-10-06 DIAGNOSIS — R262 Difficulty in walking, not elsewhere classified: Secondary | ICD-10-CM | POA: Diagnosis not present

## 2018-10-08 DIAGNOSIS — R262 Difficulty in walking, not elsewhere classified: Secondary | ICD-10-CM | POA: Diagnosis not present

## 2018-10-08 DIAGNOSIS — R296 Repeated falls: Secondary | ICD-10-CM | POA: Diagnosis not present

## 2018-10-08 DIAGNOSIS — M6281 Muscle weakness (generalized): Secondary | ICD-10-CM | POA: Diagnosis not present

## 2018-10-12 DIAGNOSIS — R296 Repeated falls: Secondary | ICD-10-CM | POA: Diagnosis not present

## 2018-10-12 DIAGNOSIS — R262 Difficulty in walking, not elsewhere classified: Secondary | ICD-10-CM | POA: Diagnosis not present

## 2018-10-12 DIAGNOSIS — M6281 Muscle weakness (generalized): Secondary | ICD-10-CM | POA: Diagnosis not present

## 2018-10-14 DIAGNOSIS — R262 Difficulty in walking, not elsewhere classified: Secondary | ICD-10-CM | POA: Diagnosis not present

## 2018-10-14 DIAGNOSIS — R296 Repeated falls: Secondary | ICD-10-CM | POA: Diagnosis not present

## 2018-10-14 DIAGNOSIS — M6281 Muscle weakness (generalized): Secondary | ICD-10-CM | POA: Diagnosis not present

## 2018-10-19 DIAGNOSIS — R296 Repeated falls: Secondary | ICD-10-CM | POA: Diagnosis not present

## 2018-10-19 DIAGNOSIS — R262 Difficulty in walking, not elsewhere classified: Secondary | ICD-10-CM | POA: Diagnosis not present

## 2018-10-19 DIAGNOSIS — M6281 Muscle weakness (generalized): Secondary | ICD-10-CM | POA: Diagnosis not present

## 2018-10-20 DIAGNOSIS — I10 Essential (primary) hypertension: Secondary | ICD-10-CM | POA: Diagnosis not present

## 2018-10-20 DIAGNOSIS — E785 Hyperlipidemia, unspecified: Secondary | ICD-10-CM | POA: Diagnosis not present

## 2018-10-20 DIAGNOSIS — E039 Hypothyroidism, unspecified: Secondary | ICD-10-CM | POA: Diagnosis not present

## 2018-10-20 DIAGNOSIS — M069 Rheumatoid arthritis, unspecified: Secondary | ICD-10-CM | POA: Diagnosis not present

## 2018-10-21 ENCOUNTER — Other Ambulatory Visit: Payer: Self-pay

## 2018-10-21 ENCOUNTER — Telehealth (INDEPENDENT_AMBULATORY_CARE_PROVIDER_SITE_OTHER): Payer: PPO | Admitting: Cardiology

## 2018-10-21 ENCOUNTER — Encounter: Payer: Self-pay | Admitting: Cardiology

## 2018-10-21 VITALS — HR 60 | Ht 63.0 in | Wt 183.0 lb

## 2018-10-21 DIAGNOSIS — E7800 Pure hypercholesterolemia, unspecified: Secondary | ICD-10-CM

## 2018-10-21 DIAGNOSIS — M6281 Muscle weakness (generalized): Secondary | ICD-10-CM | POA: Diagnosis not present

## 2018-10-21 DIAGNOSIS — R296 Repeated falls: Secondary | ICD-10-CM | POA: Diagnosis not present

## 2018-10-21 DIAGNOSIS — R262 Difficulty in walking, not elsewhere classified: Secondary | ICD-10-CM | POA: Diagnosis not present

## 2018-10-21 DIAGNOSIS — I493 Ventricular premature depolarization: Secondary | ICD-10-CM

## 2018-10-21 DIAGNOSIS — I10 Essential (primary) hypertension: Secondary | ICD-10-CM

## 2018-10-21 DIAGNOSIS — M069 Rheumatoid arthritis, unspecified: Secondary | ICD-10-CM

## 2018-10-21 NOTE — Patient Instructions (Signed)
Medication Instructions:  Your provider recommends that you continue on your current medications as directed. Please refer to the Current Medication list given to you today.    Follow-Up: Your provider wants you to follow-up in: 1 year with Dr. Marlou Porch. You will receive a reminder letter in the mail two months in advance. If you don't receive a letter, please call our office to schedule the follow-up appointment.

## 2018-10-21 NOTE — Progress Notes (Signed)
Virtual Visit via Video Note   This visit type was conducted due to national recommendations for restrictions regarding the COVID-19 Pandemic (e.g. social distancing) in an effort to limit this patient's exposure and mitigate transmission in our community.  Due to her co-morbid illnesses, this patient is at least at moderate risk for complications without adequate follow up.  This format is felt to be most appropriate for this patient at this time.  All issues noted in this document were discussed and addressed.  A limited physical exam was performed with this format.  Please refer to the patient's chart for her consent to telehealth for [facility].   Evaluation Performed:  Follow-up visit  Date:  10/21/2018   ID:  Kari [name] [name], DOB [birth year], MRN [id number]  Patient Location: Home Provider Location: Home  PCP:  Kari [name] Nip, MD  Cardiologist:  Kari [name] Furbish, MD  Electrophysiologist:  None   Chief Complaint: Follow-up palpitations  History of Present Illness:    Kari [name] [name] is a 68 y.o. female with palpitations. ECHO prior normal.   Have not noticed palpitations in quite a while. Has been doing well with nadolol.  Denies any chest pain shortness of breath fevers chills cough is eager to get back to her travels.  Has been doing Lego, helps arthritis  The patient does not have symptoms concerning for COVID-19 infection (fever, chills, cough, or new shortness of breath).    Past Medical History:  Diagnosis Date  . Arthritis   . Complication of anesthesia    when block attempted ax -turned red in preop-surg delayed  . Fibromyalgia   . GERD (gastroesophageal reflux disease)   . Hypertension   . Hypothyroidism   . MVP (mitral valve prolapse)   . Osteoporosis   . Rheumatoid aortitis    ? arthritis   Past Surgical History:  Procedure Laterality Date  . ABDOMINAL HYSTERECTOMY    . CESAREAN SECTION    . CHOLECYSTECTOMY    . COLONOSCOPY    . KNEE ARTHROSCOPY Right  2/11  . TOTAL HIP ARTHROPLASTY Left 02/03/2013   Procedure: TOTAL HIP ARTHROPLASTY;  Surgeon: Kari [name] Salen, MD;  Location: [facility];  Service: Orthopedics;  Laterality: Left;  . ULNAR NERVE TRANSPOSITION  09/17/2011   Procedure: ULNAR NERVE DECOMPRESSION/TRANSPOSITION;  Surgeon: Kari [name] Sickle., MD;  Location: [facility];  Service: Orthopedics;  Laterality: Left;  decompression  . WRIST ARTHROSCOPY Right    x2-fusion    . WRIST ARTHROSCOPY Left      Current Meds  Medication Sig  . benzonatate (TESSALON) 100 MG capsule Take 100 mg by mouth 2 (two) times daily as needed for cough.   . Calcium-Vitamin D (CALTRATE 600 PLUS-VIT D PO) Take 1 tablet by mouth 2 (two) times daily.  . cholecalciferol (VITAMIN D) 1000 UNITS tablet Take 1,000 Units by mouth daily.  . cyclobenzaprine (FLEXERIL) 10 MG tablet Take 10 mg by mouth as needed.   Kari [name] [name] SURECLICK 50 MG/ML injection Inject 0.98 mLs (50 mg total) into the skin once a week.  . fluticasone (FLONASE) 50 MCG/ACT nasal spray Place 1 spray into the nose daily.  . folic acid (FOLVITE) 1 MG tablet TAKE TWO TABLETS BY MOUTH DAILY   . gabapentin (NEURONTIN) 300 MG capsule TAKE FOUR CAPSULES BY MOUTH DAILY   . levothyroxine (SYNTHROID, LEVOTHROID) 50 MCG tablet Take 50 mcg by mouth daily.  . methotrexate 2.5 MG tablet Take 6 tablets (15 mg total) by mouth  once a week.  . Multiple Vitamin (MULTIVITAMIN WITH MINERALS) TABS Take 1 tablet by mouth daily.  . nadolol (CORGARD) 40 MG tablet Take 40 mg by mouth daily.  . Omega-3 Fatty Acids (FISH OIL PO) Take 1 capsule by mouth daily.  . pantoprazole (PROTONIX) 40 MG tablet Take 40 mg by mouth 2 (two) times daily.   Marland Kitchen PROAIR HFA 108 (90 BASE) MCG/ACT inhaler as needed.   . simvastatin (ZOCOR) 20 MG tablet Take 20 mg by mouth every evening.  Marland Kitchen spironolactone (ALDACTONE) 25 MG tablet TAKE 1/2 TABLET BY MOUTH ONCE A DAY. Please keep upcoming appt in April with Dr. Marlou [name] for future refills. Thank  you  . sucralfate (CARAFATE) 1 g tablet Take 1 g by mouth as needed (stomach).      Allergies:   Clinoril [sulindac]; Erythromycin; Hydrocodone; Penicillins; Sulfa antibiotics; Trimethoprim; and Vancomycin cross reactors   Social History   Tobacco Use  . Smoking status: Never Smoker  . Smokeless tobacco: Never Used  Substance Use Topics  . Alcohol use: Not Currently    Comment: rarely   . Drug use: No     Family Hx: The patient's family history includes Colon cancer in an other family member; Colon polyps in an other family member; Heart attack in her father; Heart disease in her mother; Hypothyroidism in her son; Stroke in her mother.  ROS:   Please see the history of present illness.     All other systems reviewed and are negative.   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  Recent Labs: 08/18/2018: ALT 25; BUN 18; Creat 0.76; Hemoglobin 14.3; Platelets 272; Potassium 4.2; Sodium 139   Recent Lipid Panel No results found for: CHOL, TRIG, HDL, CHOLHDL, LDLCALC, LDLDIRECT  Wt Readings from Last 3 Encounters:  10/21/18 183 lb (83 kg)  08/31/18 188 lb 12.8 oz (85.6 kg)  04/01/18 192 lb 3.2 oz (87.2 kg)     Objective:    Vital Signs:  Pulse 60   Ht 5\' 3"  (1.6 m)   Wt 183 lb (83 kg)   BMI 32.42 kg/m    VITAL SIGNS:  reviewed GEN:  no acute distress EYES:  sclerae anicteric, EOMI - Extraocular Movements Intact RESPIRATORY:  normal respiratory effort, symmetric expansion CARDIOVASCULAR:  no peripheral edema SKIN:  no rash, lesions or ulcers. MUSCULOSKELETAL:  no obvious deformities. NEURO:  alert and oriented x 3, no obvious focal deficit PSYCH:  normal affect  ASSESSMENT & PLAN:    Palpitations  - likely PVC's  - previously improved with spironolactone, ? Hypokalemia  - Nadalol tried at night previously but now taking  40mg  in the morning.  Seems to be doing well with this.  Kari [name] - Enbrel, MTX, goes to PT, wears mask during sessions  Hypokalemia  - stable upon last check, spironolactone  HTN  - controlled, no changes made.  Hyperlipidemia -Continue with simvastatin 20 mg for plaque stabilization.  She is not having any myalgias.  COVID-19 Education: The signs and symptoms of COVID-19 were discussed with the patient and how to seek care for testing (follow up with PCP or arrange E-visit).  The importance of social distancing was discussed today.  Time:   Today, I have spent 11 minutes with the patient with telehealth technology discussing the above problems.  Medication Adjustments/Labs and Tests Ordered: Current medicines are reviewed at length with the patient today.  Concerns regarding medicines are outlined above.   Tests Ordered: No orders of the defined types were placed in this encounter.   Medication Changes: No orders of the defined types were placed in this encounter.   Disposition:  Follow up in 1 year(s)  Signed, Kari [name] Furbish, MD  10/21/2018 9:08 AM    [facility]

## 2018-10-22 ENCOUNTER — Ambulatory Visit: Payer: PPO | Admitting: Cardiology

## 2018-11-12 ENCOUNTER — Telehealth: Payer: Self-pay | Admitting: Rheumatology

## 2018-11-12 MED ORDER — ONDANSETRON HCL 4 MG PO TABS: 4.0000 mg | ORAL_TABLET | Freq: Three times a day (TID) | ORAL | 0 refills | Status: DC | PRN

## 2018-11-12 NOTE — Telephone Encounter (Signed)
Ok to send in a prescription for Zofran 4 mg 1 tablet by mouth every 8 hours as needed for nausea.  Dispense 20 tablets. Zero refills.

## 2018-11-12 NOTE — Telephone Encounter (Signed)
Advised patient prescription has been sent to the pharmacy.

## 2018-11-12 NOTE — Telephone Encounter (Signed)
Patient states she has been having nausea and cramping the day after taking her MTX. Patient states she usually on has it for the day after taking the MTX. Patient states this time it lasted for two days. Patient states she mentioned it at her appointment in March 2020. Patient requesting a prescription for nausea for the day after taking the MTX. Please advise.

## 2018-11-12 NOTE — Telephone Encounter (Signed)
Patient still having a lot of nausea with MTX. Per patient, not a lot of vomiting, but nausea and stomach cramps the day after patient takes MTX, and it lasts for several days. Patient request something to be called in to help, if possible. Patient uses [facility] in [place].

## 2018-11-13 ENCOUNTER — Telehealth: Payer: Self-pay | Admitting: Rheumatology

## 2018-11-13 MED ORDER — ENBREL SURECLICK 50 MG/ML ~~LOC~~ SOAJ: 50.0000 mg | SUBCUTANEOUS | 0 refills | Status: DC

## 2018-11-13 NOTE — Telephone Encounter (Signed)
Patient left a voicemail requesting a new prescription of Enbrel be sent to [facility].  Patient states she is out of medication and due to take injection next week.

## 2018-11-13 NOTE — Telephone Encounter (Signed)
Last Visit: 08/31/18 Next Visit: 02/01/19 Labs: 08/18/18 CMP WNL. MCH elevated-stable. Rest of CBC WNL. TB Gold: 10/17/17 Neg   Okay to refill per Dr. Estanislado Pandy

## 2018-11-15 ENCOUNTER — Other Ambulatory Visit: Payer: Self-pay | Admitting: Rheumatology

## 2018-11-16 NOTE — Telephone Encounter (Signed)
Last Visit: 08/31/18 Next Visit: 02/01/19 Labs: 08/18/18 CMP WNL. MCH elevated-stable. Rest of CBC WNL.   Okay to refill per Dr. Estanislado Pandy

## 2018-11-20 ENCOUNTER — Other Ambulatory Visit: Payer: Self-pay | Admitting: *Deleted

## 2018-11-20 ENCOUNTER — Telehealth: Payer: Self-pay | Admitting: *Deleted

## 2018-11-20 DIAGNOSIS — Z79899 Other long term (current) drug therapy: Secondary | ICD-10-CM

## 2018-11-20 DIAGNOSIS — M0579 Rheumatoid arthritis with rheumatoid factor of multiple sites without organ or systems involvement: Secondary | ICD-10-CM

## 2018-11-20 NOTE — Telephone Encounter (Signed)
Medication Samples have been provided to the patient.  Drug name: Enbrel       Strength: 50 mg     Qty: 1 LOT: [id number]  Exp.Date: 03/2020  Dosing instructions: Inject 1 pen into skin once weekly   The patient has been instructed regarding the correct time, dose, and frequency of taking this medication, including desired effects and most common side effects.   Gwenlyn Perking 10:05 AM 11/20/2018

## 2018-11-24 LAB — CBC WITH DIFFERENTIAL/PLATELET
Absolute Monocytes: 449 cells/uL (ref 200–950)
Basophils Absolute: 61 cells/uL (ref 0–200)
Basophils Relative: 0.9 %
Eosinophils Absolute: 177 cells/uL (ref 15–500)
Eosinophils Relative: 2.6 %
HCT: 38.8 % (ref 35.0–45.0)
Hemoglobin: 13.6 g/dL (ref 11.7–15.5)
Lymphs Abs: 1863 cells/uL (ref 850–3900)
MCH: 34.3 pg — ABNORMAL HIGH (ref 27.0–33.0)
MCHC: 35.1 g/dL (ref 32.0–36.0)
MCV: 98 fL (ref 80.0–100.0)
MPV: 10.7 fL (ref 7.5–12.5)
Monocytes Relative: 6.6 %
Neutro Abs: 4250 cells/uL (ref 1500–7800)
Neutrophils Relative %: 62.5 %
Platelets: 226 10*3/uL (ref 140–400)
RBC: 3.96 10*6/uL (ref 3.80–5.10)
RDW: 12.8 % (ref 11.0–15.0)
Total Lymphocyte: 27.4 %
WBC: 6.8 10*3/uL (ref 3.8–10.8)

## 2018-11-24 LAB — COMPLETE METABOLIC PANEL WITH GFR
AG Ratio: 2 (calc) (ref 1.0–2.5)
ALT: 22 U/L (ref 6–29)
AST: 21 U/L (ref 10–35)
Albumin: 4.3 g/dL (ref 3.6–5.1)
Alkaline phosphatase (APISO): 72 U/L (ref 37–153)
BUN: 16 mg/dL (ref 7–25)
CO2: 30 mmol/L (ref 20–32)
Calcium: 9.5 mg/dL (ref 8.6–10.4)
Chloride: 104 mmol/L (ref 98–110)
Creat: 0.85 mg/dL (ref 0.50–0.99)
GFR, Est African American: 81 mL/min/{1.73_m2} (ref >=60)
GFR, Est Non African American: 70 mL/min/{1.73_m2} (ref >=60)
Globulin: 2.2 g/dL (calc) (ref 1.9–3.7)
Glucose, Bld: 89 mg/dL (ref 65–99)
Potassium: 4.3 mmol/L (ref 3.5–5.3)
Sodium: 140 mmol/L (ref 135–146)
Total Bilirubin: 0.6 mg/dL (ref 0.2–1.2)
Total Protein: 6.5 g/dL (ref 6.1–8.1)

## 2018-11-24 LAB — QUANTIFERON-TB GOLD PLUS
Mitogen-NIL: >10 IU/mL
NIL: 0.09 IU/mL
QuantiFERON-TB Gold Plus: NEGATIVE
TB1-NIL: <0 IU/mL
TB2-NIL: <0 IU/mL

## 2018-11-24 NOTE — Progress Notes (Signed)
TB gold negative

## 2018-11-25 DIAGNOSIS — M25561 Pain in right knee: Secondary | ICD-10-CM | POA: Diagnosis not present

## 2018-11-25 NOTE — Progress Notes (Signed)
Labs are stable.

## 2018-12-01 DIAGNOSIS — M25561 Pain in right knee: Secondary | ICD-10-CM | POA: Diagnosis not present

## 2018-12-11 DIAGNOSIS — M25561 Pain in right knee: Secondary | ICD-10-CM | POA: Diagnosis not present

## 2018-12-28 DIAGNOSIS — M25561 Pain in right knee: Secondary | ICD-10-CM | POA: Diagnosis not present

## 2018-12-31 DIAGNOSIS — M1611 Unilateral primary osteoarthritis, right hip: Secondary | ICD-10-CM | POA: Diagnosis not present

## 2018-12-31 DIAGNOSIS — M25551 Pain in right hip: Secondary | ICD-10-CM | POA: Diagnosis not present

## 2018-12-31 DIAGNOSIS — G8929 Other chronic pain: Secondary | ICD-10-CM | POA: Diagnosis not present

## 2019-01-10 ENCOUNTER — Other Ambulatory Visit: Payer: Self-pay | Admitting: Cardiology

## 2019-01-15 DIAGNOSIS — M1611 Unilateral primary osteoarthritis, right hip: Secondary | ICD-10-CM | POA: Diagnosis not present

## 2019-01-18 NOTE — Progress Notes (Signed)
Office Visit Note  Patient: Kari [name]             Date of Birth: 03-27-1951           MRN: [id number]             PCP: Aretta Nip, MD Referring: Aretta Nip, MD Visit Date: 02/01/2019 Occupation: @GUAROCC @  Subjective:  Nausea    History of Present Illness: Kari [name] is a 68 y.o. female with history of seropositive rheumatoid arthritis, DDD, and myofascial pain syndrome.  She is on Enbrel 50 mg sq injections once weekly, MTX 6 tablets po once weekly, and folic acid 2 mg po daily.  She has not missed any doses recently.  She has been experiencing nausea after her weekly methotrexate dose.  She states that her nausea has become constant and is occurring every day now.  She denies any vomiting or abdominal pain at this time.  She has not had any fevers.  She states that she takes Zofran as needed but it is ineffective. She states the nausea subsides in the evening.  She denies any recent rheumatoid arthritis flares.  She states she has chronic right knee joint pain.  She has had cortisone injections in the past as well as a nerve block which have not been effective.  She recently had a right hip joint cortisone injection which has improved her knee discomfort. She denies any joint swelling.    Activities of Daily Living:  Patient reports morning stiffness for  none.   Patient Denies nocturnal pain.  Difficulty dressing/grooming: Denies Difficulty climbing stairs: Reports Difficulty getting out of chair: Denies Difficulty using hands for taps, buttons, cutlery, and/or writing: Denies  Review of Systems  Constitutional: Positive for fatigue.  HENT: Negative for mouth sores, mouth dryness and nose dryness.   Eyes: Positive for dryness. Negative for pain and visual disturbance.  Respiratory: Negative for cough, hemoptysis, shortness of breath and difficulty breathing.   Cardiovascular: Negative for chest pain, palpitations, hypertension and swelling in legs/feet.    Gastrointestinal: Positive for nausea. Negative for blood in stool, constipation and diarrhea.  Endocrine: Negative for increased urination.  Genitourinary: Negative for painful urination.  Musculoskeletal: Positive for arthralgias, joint pain and muscle tenderness. Negative for joint swelling, myalgias, muscle weakness, morning stiffness and myalgias.  Skin: Negative for color change, pallor, rash, hair loss, nodules/bumps, skin tightness, ulcers and sensitivity to sunlight.  Allergic/Immunologic: Negative for susceptible to infections.  Neurological: Negative for numbness and weakness.  Hematological: Negative for swollen glands.  Psychiatric/Behavioral: Negative for depressed mood and sleep disturbance. The patient is not nervous/anxious.     PMFS History:  Patient Active Problem List   Diagnosis Date Noted   Hyperlipidemia 07/14/2015   Palpitation 05/11/2014   PVC (premature ventricular contraction) 05/11/2014   Hypokalemia 05/11/2014   Essential hypertension 05/11/2014   Mitral regurgitation 05/11/2014   Arthritis, hip 02/03/2013    Past Medical History:  Diagnosis Date   Arthritis    Complication of anesthesia    when block attempted ax -turned red in preop-surg delayed   Fibromyalgia    GERD (gastroesophageal reflux disease)    Hypertension    Hypothyroidism    MVP (mitral valve prolapse)    Osteoporosis    Rheumatoid aortitis    ? arthritis    Family History  Problem Relation Age of Onset   Heart attack Father    Heart disease Mother    Stroke Mother  Colon cancer Other    Colon polyps Other    Hypothyroidism Son    Past Surgical History:  Procedure Laterality Date   ABDOMINAL HYSTERECTOMY     CESAREAN SECTION     CHOLECYSTECTOMY     COLONOSCOPY     JOINT REPLACEMENT     KNEE ARTHROSCOPY Right 2/11   TOTAL HIP ARTHROPLASTY Left 02/03/2013   Procedure: TOTAL HIP ARTHROPLASTY;  Surgeon: Kerin Salen, MD;  Location: [facility];   Service: Orthopedics;  Laterality: Left;   ULNAR NERVE TRANSPOSITION  09/17/2011   Procedure: ULNAR NERVE DECOMPRESSION/TRANSPOSITION;  Surgeon: Cammie Sickle., MD;  Location: [facility];  Service: Orthopedics;  Laterality: Left;  decompression   WRIST ARTHROSCOPY Right    x2-fusion     WRIST ARTHROSCOPY Left    Social History   Social History Narrative   Not on file    There is no immunization history on file for this patient.   Objective: Vital Signs: BP (!) 144/64 (BP Location: Left Arm, Patient Position: Sitting, Cuff Size: Normal)    Pulse (!) 52    Resp 16    Ht 5\' 3"  (1.6 m)    Wt 190 lb 3.2 oz (86.3 kg)    BMI 33.69 kg/m    Physical Exam Vitals signs and nursing note reviewed.  Constitutional:      Appearance: She is well-developed.  HENT:     Head: Normocephalic and atraumatic.  Eyes:     Conjunctiva/sclera: Conjunctivae normal.  Neck:     Musculoskeletal: Normal range of motion.  Cardiovascular:     Rate and Rhythm: Normal rate and regular rhythm.     Heart sounds: Normal heart sounds.  Pulmonary:     Effort: Pulmonary effort is normal.     Breath sounds: Normal breath sounds.  Abdominal:     General: Bowel sounds are normal.     Palpations: Abdomen is soft.  Lymphadenopathy:     Cervical: No cervical adenopathy.  Skin:    General: Skin is warm and dry.     Capillary Refill: Capillary refill takes less than 2 seconds.  Neurological:     Mental Status: She is alert and oriented to person, place, and time.  Psychiatric:        Behavior: Behavior normal.      Musculoskeletal Exam:C-spine, thoracic spine, and lumbar spine good ROM.  No midline spinal tenderness.  No SI joint tenderness.  Shoulder joints, elbow joints have good range of motion with no discomfort.  Right wrist fusion.  Left wrist has good range of motion.  She is complete fist formation bilaterally.  MCPs, PIPs, DIPs good range of motion no synovitis.  Hip joints, knee  joints, ankle joints, MTPs, PIPs, DIPs good range of motion no synovitis.  No warmth or effusion bilateral knee joints.  No tenderness or swelling of ankle joints.  No tenderness over trochanteric bursa bilaterally.  CDAI Exam: CDAI Score: -- Patient Global: --; Provider Global: -- Swollen: --; Tender: -- Joint Exam   No joint exam has been documented for this visit   There is currently no information documented on the homunculus. Go to the Rheumatology activity and complete the homunculus joint exam.  Investigation: No additional findings.  Imaging: No results found.  Recent Labs: Lab Results  Component Value Date   WBC 6.8 11/20/2018   HGB 13.6 11/20/2018   PLT 226 11/20/2018   NA 140 11/20/2018   K 4.3 11/20/2018  CL 104 11/20/2018   CO2 30 11/20/2018   GLUCOSE 89 11/20/2018   BUN 16 11/20/2018   CREATININE 0.85 11/20/2018   BILITOT 0.6 11/20/2018   ALKPHOS 72 01/20/2017   AST 21 11/20/2018   ALT 22 11/20/2018   PROT 6.5 11/20/2018   ALBUMIN 4.2 01/20/2017   CALCIUM 9.5 11/20/2018   GFRAA 81 11/20/2018   QFTBGOLDPLUS NEGATIVE 11/20/2018    Speciality Comments: Recieves Enbrel through [employer] Program  Procedures:  No procedures performed Allergies: Clinoril [sulindac], Erythromycin, Hydrocodone, Penicillins, Sulfa antibiotics, Trimethoprim, and Vancomycin cross reactors   Assessment / Plan:     Visit Diagnoses: Rheumatoid arthritis with rheumatoid factor of multiple sites without organ or systems involvement ([place]) - +RF, +ANA, erosive disease: She has no synovitis on exam.  She has not had any recent rheumatoid arthritis flares.  She is clinically doing well on Enbrel 50 mg every days injections every week, methotrexate 6 tablets by mouth once weekly, folic acid 2 mg by mouth daily.  She has not missed any doses of these medications recently.  She has experienced long-term nausea with day after taking methotrexate but recently the nausea has become  constant and every day occurrence.  She has tried taking Zofran 4 mg by mouth as needed for nausea but has been ineffective.  She denies any vomiting, abdominal pain, or fevers recently.  We discussed switching her from the tablets to injectable form of methotrexate.  A prescription for injectable methotrexate will be sent to the pharmacy today.  She was advised to notify us if she cannot tolerate taking methotrexate subcutaneous injections.  We discussed that if she continues to clinically do well we can reduce her dose of methotrexate.  She was advised to notify us if develops increased joint pain or joint swelling.  She will follow-up in the office in 5 months.  High risk medication use - Enbrel SureClick 50 mg every 7 days, methotrexate 2.5 mg 6 tablets every 7 days and folic acid 1 mg 2 tablets daily.  Last TB gold negative on 11/20/2018 and will monitor yearly.  Most recent CBC/CMP within normal limits except for elevated MCH but stable on 11/20/2018.  We will update CBC and CMP today.  She will return for lab work in November and every 3 months. Standing orders are in place.  I discussed the importance of holding methotrexate and Enbrel if she develops any signs or symptoms of an infection and to resume once the infection is cleared.  She continues to get yearly skin exams while on Enbrel due to the increased risk of melanoma.- Plan: COMPLETE METABOLIC PANEL WITH GFR, CBC with Differential/Platelet  History of total left hip replacement -Doing well.  She has good range of motion with no discomfort.  No warmth or effusion was noted.  DDD (degenerative disc disease), lumbar - She has no midline spinal tenderness.   Myofascial pain syndrome - She continues to have generalized muscle aches muscle tenderness due to myofascial pain syndrome.  She has chronic fatigue.  She was encouraged to stay active and exercise on a regular basis.  Osteopenia of multiple sites - T score of -1.3. She was previously on  Fosamax and then Forteo.  She is taking a calcium and vitamin D supplement.  Other medical conditions are listed as follows:   Frequent falls - She was referred to PT in the past.   History of hypertension   History of neuropathy  History of hypothyroidism  History of  hyperlipidemia   Orders: Orders Placed This Encounter  Procedures   COMPLETE METABOLIC PANEL WITH GFR   CBC with Differential/Platelet   No orders of the defined types were placed in this encounter.   Face-to-face time spent with patient was 30 minutes. Greater than 50% of time was spent in counseling and coordination of care.  Follow-Up Instructions: Return in 5 months (on 07/04/2019) for Rheumatoid arthritis, DDD.   Ofilia Neas, PA-C  Note - This record has been created using Dragon software.  Chart creation errors have been sought, but may not always  have been located. Such creation errors do not reflect on  the standard of medical care.

## 2019-01-21 DIAGNOSIS — E039 Hypothyroidism, unspecified: Secondary | ICD-10-CM | POA: Diagnosis not present

## 2019-01-21 DIAGNOSIS — E785 Hyperlipidemia, unspecified: Secondary | ICD-10-CM | POA: Diagnosis not present

## 2019-01-21 DIAGNOSIS — I10 Essential (primary) hypertension: Secondary | ICD-10-CM | POA: Diagnosis not present

## 2019-01-21 DIAGNOSIS — M069 Rheumatoid arthritis, unspecified: Secondary | ICD-10-CM | POA: Diagnosis not present

## 2019-01-31 ENCOUNTER — Other Ambulatory Visit: Payer: Self-pay | Admitting: Rheumatology

## 2019-02-01 ENCOUNTER — Encounter: Payer: Self-pay | Admitting: Physician Assistant

## 2019-02-01 ENCOUNTER — Other Ambulatory Visit: Payer: Self-pay

## 2019-02-01 ENCOUNTER — Ambulatory Visit: Payer: PPO | Admitting: Physician Assistant

## 2019-02-01 VITALS — BP 144/64 | HR 52 | Resp 16 | Ht 63.0 in | Wt 190.2 lb

## 2019-02-01 DIAGNOSIS — R296 Repeated falls: Secondary | ICD-10-CM

## 2019-02-01 DIAGNOSIS — M7918 Myalgia, other site: Secondary | ICD-10-CM

## 2019-02-01 DIAGNOSIS — Z8639 Personal history of other endocrine, nutritional and metabolic disease: Secondary | ICD-10-CM | POA: Diagnosis not present

## 2019-02-01 DIAGNOSIS — Z8669 Personal history of other diseases of the nervous system and sense organs: Secondary | ICD-10-CM

## 2019-02-01 DIAGNOSIS — Z79899 Other long term (current) drug therapy: Secondary | ICD-10-CM | POA: Diagnosis not present

## 2019-02-01 DIAGNOSIS — M5136 Other intervertebral disc degeneration, lumbar region: Secondary | ICD-10-CM

## 2019-02-01 DIAGNOSIS — M8589 Other specified disorders of bone density and structure, multiple sites: Secondary | ICD-10-CM | POA: Diagnosis not present

## 2019-02-01 DIAGNOSIS — Z96642 Presence of left artificial hip joint: Secondary | ICD-10-CM | POA: Diagnosis not present

## 2019-02-01 DIAGNOSIS — Z8679 Personal history of other diseases of the circulatory system: Secondary | ICD-10-CM | POA: Diagnosis not present

## 2019-02-01 DIAGNOSIS — M0579 Rheumatoid arthritis with rheumatoid factor of multiple sites without organ or systems involvement: Secondary | ICD-10-CM

## 2019-02-01 MED ORDER — METHOTREXATE SODIUM CHEMO INJECTION 50 MG/2ML: INTRAMUSCULAR | 0 refills | Status: DC

## 2019-02-01 MED ORDER — "TUBERCULIN-ALLERGY SYRINGES 27G X 1/2"" 1 ML MISC": 2 refills | Status: DC

## 2019-02-01 MED ORDER — GABAPENTIN 300 MG PO CAPS: ORAL_CAPSULE | ORAL | 0 refills | Status: DC

## 2019-02-01 NOTE — Addendum Note (Signed)
Addended by: Earnestine Mealing on: 02/01/2019 03:29 PM   Modules accepted: Orders

## 2019-02-02 LAB — CBC WITH DIFFERENTIAL/PLATELET
Absolute Monocytes: 503 cells/uL (ref 200–950)
Basophils Absolute: 60 cells/uL (ref 0–200)
Basophils Relative: 0.8 %
Eosinophils Absolute: 225 cells/uL (ref 15–500)
Eosinophils Relative: 3 %
HCT: 37 % (ref 35.0–45.0)
Hemoglobin: 12.9 g/dL (ref 11.7–15.5)
Lymphs Abs: 1943 cells/uL (ref 850–3900)
MCH: 34.6 pg — ABNORMAL HIGH (ref 27.0–33.0)
MCHC: 34.9 g/dL (ref 32.0–36.0)
MCV: 99.2 fL (ref 80.0–100.0)
MPV: 10.5 fL (ref 7.5–12.5)
Monocytes Relative: 6.7 %
Neutro Abs: 4770 cells/uL (ref 1500–7800)
Neutrophils Relative %: 63.6 %
Platelets: 212 10*3/uL (ref 140–400)
RBC: 3.73 10*6/uL — ABNORMAL LOW (ref 3.80–5.10)
RDW: 12.7 % (ref 11.0–15.0)
Total Lymphocyte: 25.9 %
WBC: 7.5 10*3/uL (ref 3.8–10.8)

## 2019-02-02 LAB — COMPLETE METABOLIC PANEL WITH GFR
AG Ratio: 1.8 (calc) (ref 1.0–2.5)
ALT: 15 U/L (ref 6–29)
AST: 15 U/L (ref 10–35)
Albumin: 4 g/dL (ref 3.6–5.1)
Alkaline phosphatase (APISO): 72 U/L (ref 37–153)
BUN: 17 mg/dL (ref 7–25)
CO2: 23 mmol/L (ref 20–32)
Calcium: 9.1 mg/dL (ref 8.6–10.4)
Chloride: 105 mmol/L (ref 98–110)
Creat: 0.96 mg/dL (ref 0.50–0.99)
GFR, Est African American: 70 mL/min/{1.73_m2} (ref >=60)
GFR, Est Non African American: 60 mL/min/{1.73_m2} (ref >=60)
Globulin: 2.2 g/dL (calc) (ref 1.9–3.7)
Glucose, Bld: 110 mg/dL — ABNORMAL HIGH (ref 65–99)
Potassium: 3.8 mmol/L (ref 3.5–5.3)
Sodium: 140 mmol/L (ref 135–146)
Total Bilirubin: 0.6 mg/dL (ref 0.2–1.2)
Total Protein: 6.2 g/dL (ref 6.1–8.1)

## 2019-02-02 NOTE — Progress Notes (Signed)
Glucose is 110.  Rest of CMP WNL. RBC count is borderline low.  MCH is borderline elevated.  We will continue to monitor.

## 2019-02-03 ENCOUNTER — Telehealth: Payer: Self-pay | Admitting: Rheumatology

## 2019-02-03 ENCOUNTER — Other Ambulatory Visit: Payer: Self-pay | Admitting: *Deleted

## 2019-02-03 MED ORDER — ENBREL SURECLICK 50 MG/ML ~~LOC~~ SOAJ: 50.0000 mg | SUBCUTANEOUS | 0 refills | Status: DC

## 2019-02-03 NOTE — Telephone Encounter (Signed)
Patient called stating she received a call from [employer] stating they need a new prescription from Dr. Estanislado Pandy before they can refill her prescription of Enbrel.

## 2019-02-03 NOTE — Telephone Encounter (Signed)
Last Visit: 02/01/19 Next Visit: 07/06/19 Labs: 02/01/19 Glucose is 110. Rest of CMP WNL. RBC count is borderline low. MCH is borderline elevated. We will continue to monitor.  TB Gold: 10/31/18 Neg   Okay to refill per Dr. Estanislado Pandy

## 2019-02-11 DIAGNOSIS — M25551 Pain in right hip: Secondary | ICD-10-CM | POA: Diagnosis not present

## 2019-02-11 DIAGNOSIS — M1611 Unilateral primary osteoarthritis, right hip: Secondary | ICD-10-CM | POA: Diagnosis not present

## 2019-02-11 DIAGNOSIS — M25561 Pain in right knee: Secondary | ICD-10-CM | POA: Diagnosis not present

## 2019-02-20 IMAGING — MR MR LUMBAR SPINE W/O CM
4 of 5 series · 26 of 48 positions shown · non-contrast
Comparison: 09/02/2010

CLINICAL DATA: Low back pain radiating into the right buttock

EXAM:
MRI LUMBAR SPINE WITHOUT CONTRAST
TECHNIQUE: Multiplanar, multisequence MR imaging of the lumbar spine was
performed. No intravenous contrast was administered.

[Series 3: T2 · sagittal · 4.0mm · 0.55mm/px · 4 of 15 slices shown (1 of 2)]
[im 1/15]
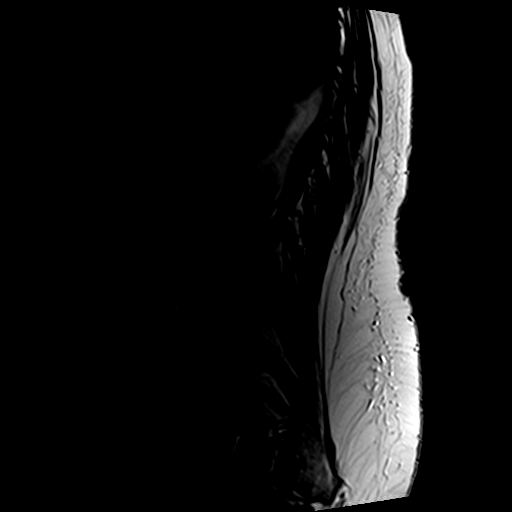
[im 5/15]
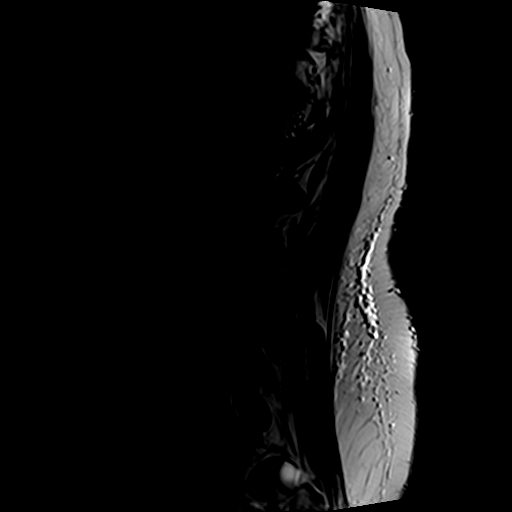
[im 10/15]
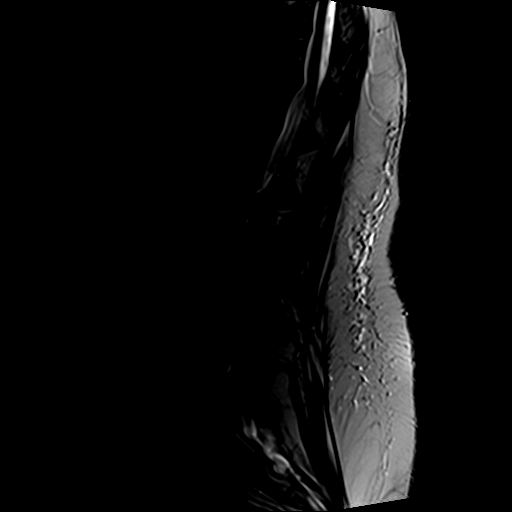
[im 15/15]
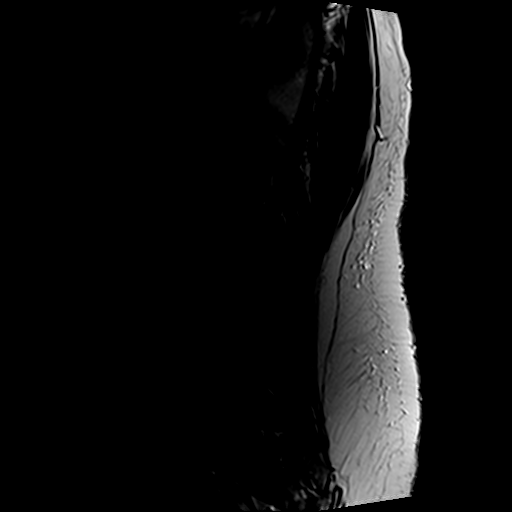

[Series 5: T1 · sagittal · 4.0mm · 0.55mm/px · 5 of 15 slices shown (1 of 2)]
[im 1/15]
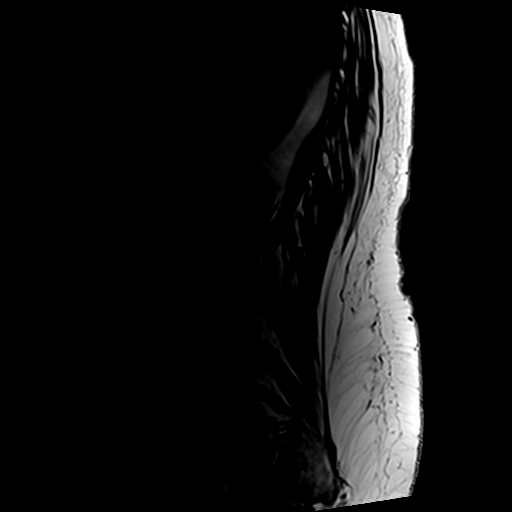
[im 4/15]
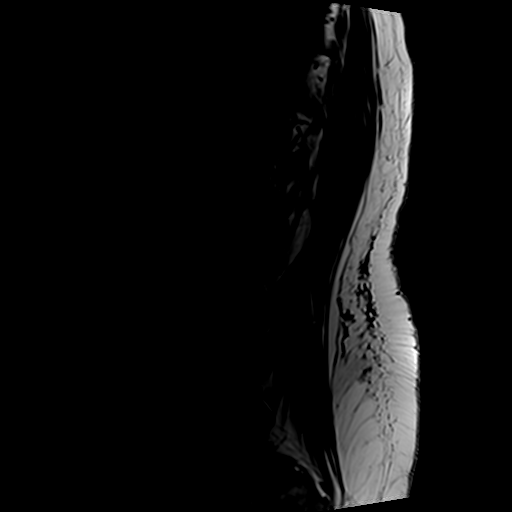
[im 8/15]
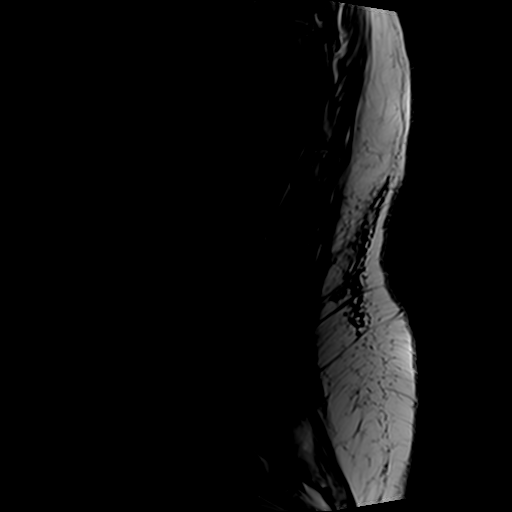
[im 11/15]
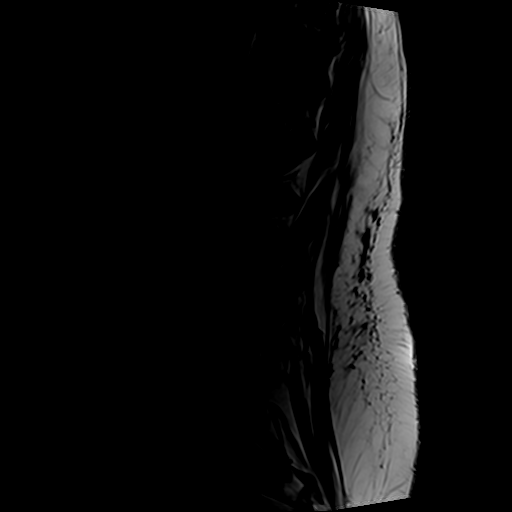
[im 15/15]
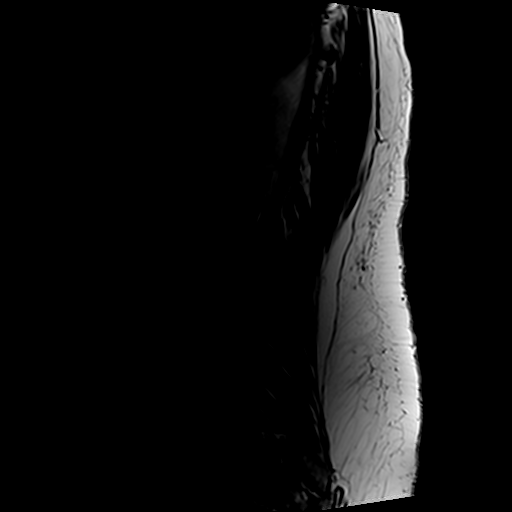

[Series 6: T1 · axial · 4.0mm · 0.35mm/px · z∈[-135,+126]mm · 6 of 55 slices shown (2 of 2)]
[im 4/55]
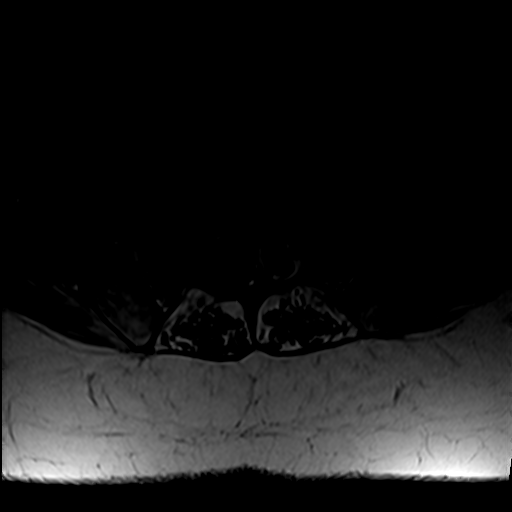
[im 7/55]
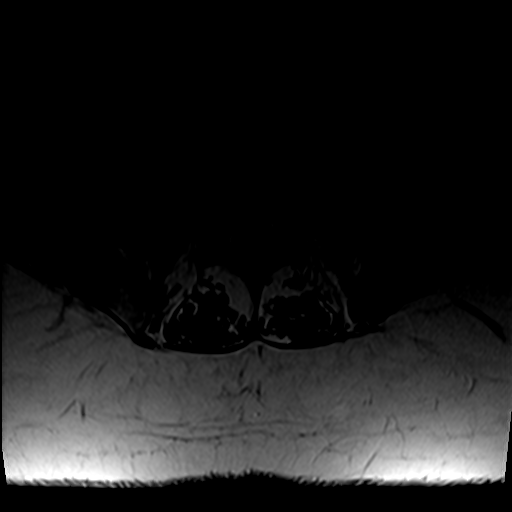
[im 11/55]
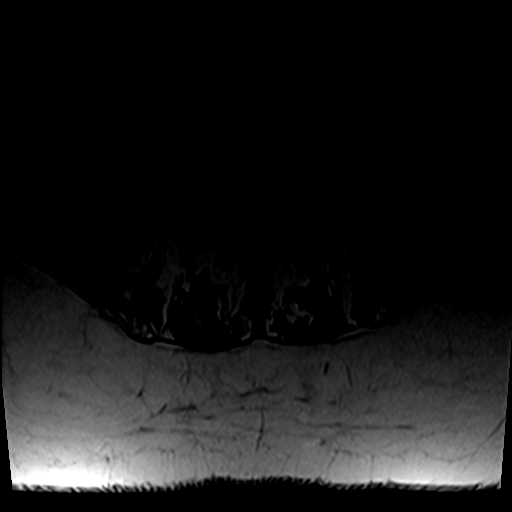
[im 17/55]
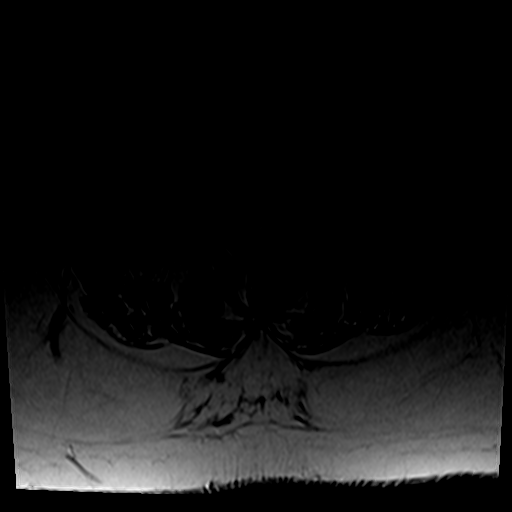
[im 28/55]
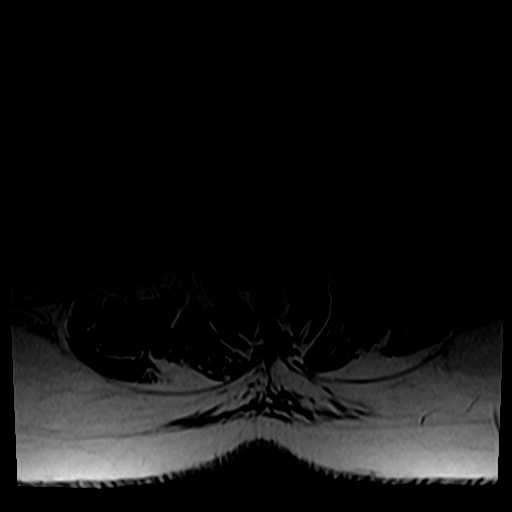
[im 48/55]
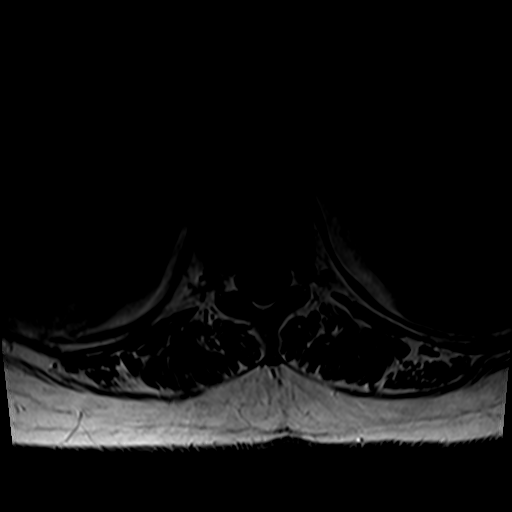

[Series 7: T2 · axial · 4.0mm · 0.70mm/px · z∈[-135,+141]mm · 11 of 55 slices shown (2 of 2)]
[im 4/55]
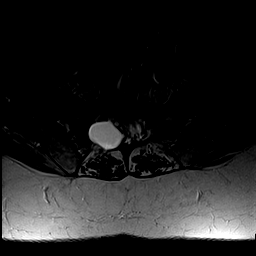
[im 7/55]
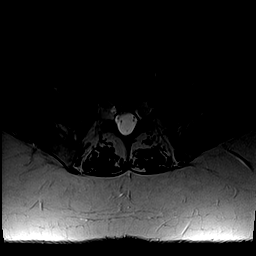
[im 11/55]
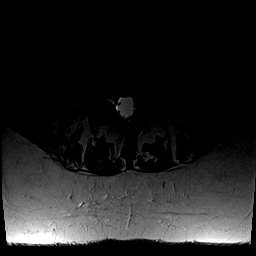
[im 17/55]
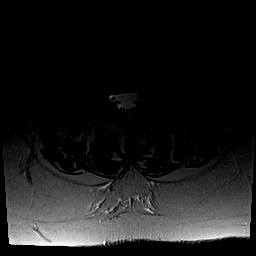
[im 24/55]
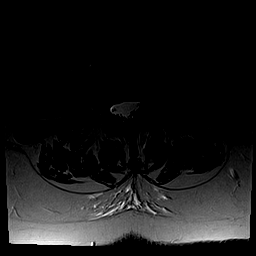
[im 28/55]
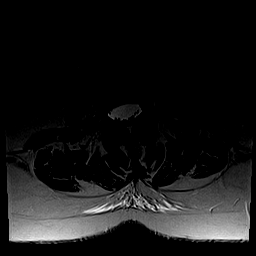
[im 31/55]
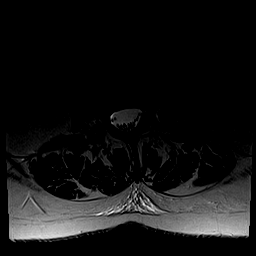
[im 38/55]
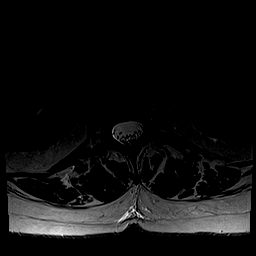
[im 44/55]
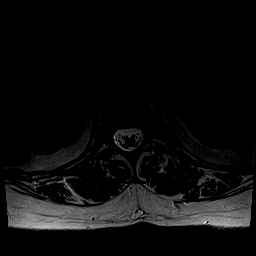
[im 48/55]
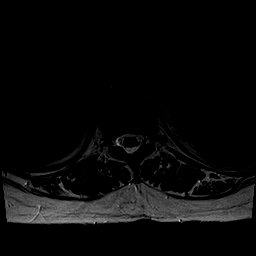
[im 51/55]
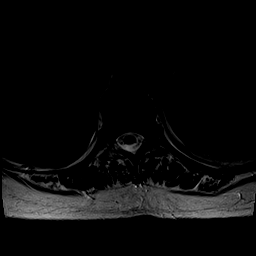

[26 of 48 positions shown; findings below may reference images not displayed]

FINDINGS: Segmentation:  Standard.

Alignment:  Physiologic.

Vertebrae:  No fracture, evidence of discitis, or bone lesion.

Conus medullaris and cauda equina: Conus extends to the T12 level.
Conus and cauda equina appear normal. Sacral Tarlov cyst.

Paraspinal and other soft tissues: No paraspinal abnormality.

Disc levels:

Disc spaces: Degenerative disc disease with disc height loss at
T10-11, T11-12. Disc desiccation throughout the lumbar spine.

On the sagittal images there are mild broad-based disc bulges at
T10-11 and T11-12.

T12-L1: Mild broad-based disc bulge. No evidence of neural foraminal
stenosis. No central canal stenosis.

L1-L2: Mild broad-based disc bulge. No evidence of neural foraminal
stenosis. No central canal stenosis.

L2-L3: Mild broad-based disc bulge. Mild bilateral facet
arthropathy. No evidence of neural foraminal stenosis. No central
canal stenosis.

L3-L4: Mild broad-based disc bulge. Moderate bilateral facet
arthropathy. No evidence of neural foraminal stenosis. No central
canal stenosis.

L4-L5: Mild broad-based disc bulge. No evidence of neural foraminal
stenosis. No central canal stenosis.

L5-S1: No significant disc bulge. No evidence of neural foraminal
stenosis. No central canal stenosis.
IMPRESSION: Mild lumbar spine spondylosis as described above.

## 2019-03-04 ENCOUNTER — Telehealth: Payer: Self-pay | Admitting: Rheumatology

## 2019-03-04 NOTE — Telephone Encounter (Signed)
Returned patient call.  She states that she took MTX incorrectly for 2 weeks and administered 0.2 ml instead of 0.56ml.  She still had severe nausea at the 0.2 ml dose.  She is willing to decrease to 0.4 ml but fears it will not be helpful.  She is inquiring if she can stop MTX all together as it is decreasing her quality of life. Please advise.

## 2019-03-04 NOTE — Telephone Encounter (Signed)
Last visit 02/01/2019.  She was switched to injectable MTX due to extreme nausea and vomiting with oral despite Zofran use.  Discussed decreasing MTX dose in time at last visit if she remains stable and follow up appt.scheduled for 5 months.  She is still experiencing nausea/vomiting and wants to go ahead and decrease MTX.  Please advise.

## 2019-03-04 NOTE — Telephone Encounter (Signed)
Ok to continue on Enbrel 50 mg sq weekly injections as monotherapy.  She can discontinue MTX at this time.  Please advise patient to notify us if she develops increased joint pain or joint swelling.

## 2019-03-04 NOTE — Telephone Encounter (Signed)
Patient called requesting a return call to let her know if she can start weaning off Methotrexate.  Patient states she has been extremely nauseous with vomiting.

## 2019-03-04 NOTE — Telephone Encounter (Signed)
I spoke with Dr. Estanislado Pandy and she is ok with the patient reducing MTX to 0.4 once weekly. Please advise patient to notify us if she develops increased joint pain or joint swelling.

## 2019-03-09 NOTE — Telephone Encounter (Signed)
Called to notify patient she may discontinued methotrexate and continue on Enbrel monotherapy.  Her next appointment is in January 2021.  Advised patient to notify our office and schedule an appointment if she develops increased joint pain and/or swelling.  Patient verbalized understanding.  All questions encouraged and answered.     Mariella Saa, PharmD, [place], [place] Clinical Specialty Pharmacist [phone]  03/09/2019 2:09 PM

## 2019-03-09 NOTE — Addendum Note (Signed)
Addended by: Mariella Saa C on: 03/09/2019 02:10 PM   Modules accepted: Orders

## 2019-03-17 DIAGNOSIS — Z23 Encounter for immunization: Secondary | ICD-10-CM | POA: Diagnosis not present

## 2019-03-17 DIAGNOSIS — M069 Rheumatoid arthritis, unspecified: Secondary | ICD-10-CM | POA: Diagnosis not present

## 2019-03-17 DIAGNOSIS — Z1159 Encounter for screening for other viral diseases: Secondary | ICD-10-CM | POA: Diagnosis not present

## 2019-03-17 DIAGNOSIS — E7800 Pure hypercholesterolemia, unspecified: Secondary | ICD-10-CM | POA: Diagnosis not present

## 2019-03-17 DIAGNOSIS — Z0000 Encounter for general adult medical examination without abnormal findings: Secondary | ICD-10-CM | POA: Diagnosis not present

## 2019-03-17 DIAGNOSIS — E039 Hypothyroidism, unspecified: Secondary | ICD-10-CM | POA: Diagnosis not present

## 2019-03-17 DIAGNOSIS — I10 Essential (primary) hypertension: Secondary | ICD-10-CM | POA: Diagnosis not present

## 2019-04-13 ENCOUNTER — Telehealth: Payer: Self-pay | Admitting: Pharmacist

## 2019-04-13 DIAGNOSIS — H40013 Open angle with borderline findings, low risk, bilateral: Secondary | ICD-10-CM | POA: Diagnosis not present

## 2019-04-13 DIAGNOSIS — Z961 Presence of intraocular lens: Secondary | ICD-10-CM | POA: Diagnosis not present

## 2019-04-13 DIAGNOSIS — H35383 Toxic maculopathy, bilateral: Secondary | ICD-10-CM | POA: Diagnosis not present

## 2019-04-13 DIAGNOSIS — H18413 Arcus senilis, bilateral: Secondary | ICD-10-CM | POA: Diagnosis not present

## 2019-04-13 NOTE — Telephone Encounter (Signed)
Patient dropped off Enbrel renewal application to office.  Provider portion completed and signed.  Faxed to [employer] for approval.  Will update when we receive a response.   Mariella Saa, PharmD, [place], [place] Clinical Specialty Pharmacist [phone]  04/13/2019 3:21 PM

## 2019-04-16 ENCOUNTER — Telehealth: Payer: Self-pay | Admitting: Rheumatology

## 2019-04-16 NOTE — Telephone Encounter (Signed)
[facility] left a voicemail stating they received Enbrel renewl application, but are missing the 2021 Prior Authorization letter.  Please fax to [phone]  Case [id number]

## 2019-04-19 NOTE — Telephone Encounter (Signed)
[employer] requires PA to be initiated 30 days from expiration. Will initiate 06/01/19.

## 2019-04-23 DIAGNOSIS — M25551 Pain in right hip: Secondary | ICD-10-CM | POA: Diagnosis not present

## 2019-04-23 DIAGNOSIS — M1611 Unilateral primary osteoarthritis, right hip: Secondary | ICD-10-CM | POA: Diagnosis not present

## 2019-04-26 ENCOUNTER — Other Ambulatory Visit: Payer: Self-pay | Admitting: Cardiology

## 2019-04-26 ENCOUNTER — Other Ambulatory Visit: Payer: Self-pay | Admitting: Physician Assistant

## 2019-04-26 NOTE — Telephone Encounter (Signed)
Last Visit: 02/01/19 Next Visit: 07/06/19  Okay to refill per Dr. Estanislado Pandy

## 2019-05-03 DIAGNOSIS — G43909 Migraine, unspecified, not intractable, without status migrainosus: Secondary | ICD-10-CM | POA: Diagnosis not present

## 2019-05-05 ENCOUNTER — Other Ambulatory Visit: Payer: Self-pay

## 2019-05-05 DIAGNOSIS — Z79899 Other long term (current) drug therapy: Secondary | ICD-10-CM | POA: Diagnosis not present

## 2019-05-06 LAB — CBC WITH DIFFERENTIAL/PLATELET
Absolute Monocytes: 666 cells/uL (ref 200–950)
Basophils Absolute: 81 cells/uL (ref 0–200)
Basophils Relative: 0.9 %
Eosinophils Absolute: 180 cells/uL (ref 15–500)
Eosinophils Relative: 2 %
HCT: 39.1 % (ref 35.0–45.0)
Hemoglobin: 13.7 g/dL (ref 11.7–15.5)
Lymphs Abs: 2061 cells/uL (ref 850–3900)
MCH: 34.9 pg — ABNORMAL HIGH (ref 27.0–33.0)
MCHC: 35 g/dL (ref 32.0–36.0)
MCV: 99.5 fL (ref 80.0–100.0)
MPV: 10 fL (ref 7.5–12.5)
Monocytes Relative: 7.4 %
Neutro Abs: 6012 cells/uL (ref 1500–7800)
Neutrophils Relative %: 66.8 %
Platelets: 239 10*3/uL (ref 140–400)
RBC: 3.93 10*6/uL (ref 3.80–5.10)
RDW: 11.7 % (ref 11.0–15.0)
Total Lymphocyte: 22.9 %
WBC: 9 10*3/uL (ref 3.8–10.8)

## 2019-05-06 LAB — COMPLETE METABOLIC PANEL WITH GFR
AG Ratio: 1.7 (calc) (ref 1.0–2.5)
ALT: 13 U/L (ref 6–29)
AST: 15 U/L (ref 10–35)
Albumin: 3.8 g/dL (ref 3.6–5.1)
Alkaline phosphatase (APISO): 71 U/L (ref 37–153)
BUN: 16 mg/dL (ref 7–25)
CO2: 25 mmol/L (ref 20–32)
Calcium: 8.6 mg/dL (ref 8.6–10.4)
Chloride: 106 mmol/L (ref 98–110)
Creat: 0.85 mg/dL (ref 0.60–0.93)
GFR, Est African American: 80 mL/min/{1.73_m2} (ref >=60)
GFR, Est Non African American: 69 mL/min/{1.73_m2} (ref >=60)
Globulin: 2.2 g/dL (calc) (ref 1.9–3.7)
Glucose, Bld: 92 mg/dL (ref 65–99)
Potassium: 4.1 mmol/L (ref 3.5–5.3)
Sodium: 141 mmol/L (ref 135–146)
Total Bilirubin: 0.6 mg/dL (ref 0.2–1.2)
Total Protein: 6 g/dL — ABNORMAL LOW (ref 6.1–8.1)

## 2019-05-06 NOTE — Progress Notes (Signed)
Labs are stable.

## 2019-05-20 DIAGNOSIS — I10 Essential (primary) hypertension: Secondary | ICD-10-CM | POA: Diagnosis not present

## 2019-05-20 DIAGNOSIS — E785 Hyperlipidemia, unspecified: Secondary | ICD-10-CM | POA: Diagnosis not present

## 2019-05-20 DIAGNOSIS — M069 Rheumatoid arthritis, unspecified: Secondary | ICD-10-CM | POA: Diagnosis not present

## 2019-05-20 DIAGNOSIS — E039 Hypothyroidism, unspecified: Secondary | ICD-10-CM | POA: Diagnosis not present

## 2019-05-20 DIAGNOSIS — E7800 Pure hypercholesterolemia, unspecified: Secondary | ICD-10-CM | POA: Diagnosis not present

## 2019-05-24 DIAGNOSIS — E039 Hypothyroidism, unspecified: Secondary | ICD-10-CM | POA: Diagnosis not present

## 2019-06-01 ENCOUNTER — Telehealth: Payer: Self-pay | Admitting: Pharmacy Technician

## 2019-06-01 NOTE — Telephone Encounter (Signed)
Called [employer] and initiated Enbrel Prior Auth. Forms will be faxed to MD office, will complete and submit.  11:49 AM Beatriz Chancellor, CPhT

## 2019-06-03 NOTE — Telephone Encounter (Signed)
Received notification from [facility] regarding a prior authorization for ENBREL. Authorization has been APPROVED from 06/03/2019 to 06/02/2020.   Will send document to scan center.  Faxed PA approval to [employer] for renewal application.   Mariella Saa, PharmD, [place], [place] Clinical Specialty Pharmacist [phone]  06/03/2019 1:56 PM

## 2019-06-17 ENCOUNTER — Other Ambulatory Visit: Payer: Self-pay

## 2019-06-17 ENCOUNTER — Telehealth: Payer: Self-pay | Admitting: Rheumatology

## 2019-06-17 ENCOUNTER — Telehealth: Payer: Self-pay | Admitting: Cardiology

## 2019-06-17 MED ORDER — SPIRONOLACTONE 25 MG PO TABS: 12.5000 mg | ORAL_TABLET | Freq: Every day | ORAL | 1 refills | Status: DC

## 2019-06-17 NOTE — Telephone Encounter (Signed)
Anne Ng from [facility] called requesting prescription refill of Gabapentin to be sent to [facility] at [address]  Anne Ng states that she works with this patient to coordinate medication refills.  If you have any questions, please call back at [phone]

## 2019-06-17 NOTE — Telephone Encounter (Signed)
Last fill of gabapentin: 04/26/2019 (90 day supply)  Refill is not due until 07/26/2018. Attempted to contact Annette and left message on machine advise it is too soon for a refill.

## 2019-06-17 NOTE — Telephone Encounter (Signed)
*  STAT* If patient is at the pharmacy, call can be transferred to refill team.   1. Which medications need to be refilled? (please list name of each medication and dose if known) spironolactone (ALDACTONE) 25 MG tablet  2. Which pharmacy/location (including street and city if local pharmacy) is medication to be sent to? [facility] [phone]  3. Do they need a 30 day or 90 day supply? 90 day

## 2019-06-18 DIAGNOSIS — E039 Hypothyroidism, unspecified: Secondary | ICD-10-CM | POA: Diagnosis not present

## 2019-06-18 DIAGNOSIS — E785 Hyperlipidemia, unspecified: Secondary | ICD-10-CM | POA: Diagnosis not present

## 2019-06-18 DIAGNOSIS — M069 Rheumatoid arthritis, unspecified: Secondary | ICD-10-CM | POA: Diagnosis not present

## 2019-06-18 DIAGNOSIS — I10 Essential (primary) hypertension: Secondary | ICD-10-CM | POA: Diagnosis not present

## 2019-06-18 DIAGNOSIS — E7800 Pure hypercholesterolemia, unspecified: Secondary | ICD-10-CM | POA: Diagnosis not present

## 2019-07-05 NOTE — Progress Notes (Signed)
Virtual Visit via Video Note  I connected with [name] on 07/06/19 at  8:45 AM EST by a video enabled telemedicine application and verified that I am speaking with the correct person using two identifiers.  Location: Patient: Home  Provider: Clinic  This service was conducted via virtual visit.  Both audio and visual tools were used.  The patient was located at home. I was located in my office.  Consent was obtained prior to the virtual visit and is aware of possible charges through their insurance for this visit.  The patient is an established patient.  Dr. Estanislado Pandy, MD conducted the virtual visit and [name], PA-C acted as scribe during the service.  Office staff helped with scheduling follow up visits after the service was conducted.     I discussed the limitations of evaluation and management by telemedicine and the availability of in person appointments. The patient expressed understanding and agreed to proceed.  CC: Ankle joint pain  History of Present Illness: Kari [name] is a 69 y.o. female with history of seropositive rheumatoid arthritis, DDD, and myofascial pain syndrome.  She is on Enbrel 50 mg sq injections once weekly.  She is no longer taking MTX due to experiencing significant nausea.  She has been having more frequent flares recently.  She states 5 days ago she experienced a flare in her right ankle joint.  She states she had pain and swelling in the ankle joint.  Her pain is a 4/10 currently and she is able to fully bear weight.  She has intermittent pain and inflammation in her left wrist joint.  Review of Systems  Constitutional: Positive for malaise/fatigue. Negative for fever.  HENT: Positive for congestion.   Eyes: Negative for photophobia, pain, discharge and redness.  Respiratory: Negative for cough, shortness of breath and wheezing.   Cardiovascular: Negative for chest pain and palpitations.  Gastrointestinal: Negative for blood in stool, constipation and  diarrhea.  Genitourinary: Negative for dysuria and frequency.  Musculoskeletal: Positive for joint pain. Negative for back pain, myalgias and neck pain.  Skin: Negative for rash.  Neurological: Negative for dizziness, weakness and headaches.  Endo/Heme/Allergies: Does not bruise/bleed easily.  Psychiatric/Behavioral: Negative for depression and memory loss. The patient is not nervous/anxious and does not have insomnia.      Observations/Objective: Physical Exam  Constitutional: She is oriented to person, place, and time and well-developed, well-nourished, and in no distress.  HENT:  Head: Normocephalic and atraumatic.  Eyes: Conjunctivae are normal.  Pulmonary/Chest: Effort normal.  Neurological: She is alert and oriented to person, place, and time.  Psychiatric: Mood, memory, affect and judgment normal.   Patient reports morning stiffness for 0 NONE.   Patient denies nocturnal pain.  Difficulty dressing/grooming: Denies Difficulty climbing stairs: Denies Difficulty getting out of chair: Denies Difficulty using hands for taps, buttons, cutlery, and/or writing: Denies  She rates her RA a 4/10.   Assessment and Plan: Visit Diagnoses: Rheumatoid arthritis with rheumatoid factor of multiple sites without organ or systems involvement ([place]) - +RF, +ANA, erosive disease: She has been experiencing more frequent flares over the past 1 month.  She had a flare in the right ankle joint 5 days ago.  She continues to have tenderness and mild inflammation in the right ankle joint, but she is able to fully bear weight and rates the pain a 4/10 currently.  She has intermittent flares in the left wrist joint but the discomfort is mild at this time.  She  is on Enbrel 50 mg sq injections once weekly as monotherapy.  She discontinued MTX (tablets and injections) due to significant nausea.  She felt that combination therapy was more effective, and she would like to retry injectable MTx 0.6 ml sq once weekly.   She does not need a refill at this time.  She was advised to notify us if she develops increased joint pain or joint swelling.  She will follow up in 3 months.     High risk medication use - Enbrel SureClick 50 mg every 7 days, methotrexate 0.6 ml sq injections every 7 days and folic acid 1 mg 2 tablets daily.  Last TB gold negative on 11/20/2018 and will monitor yearly. CBC and CMP were drawn on 05/05/19.  She will be due to update lab work in February and every 3 months. TB gold negative on 11/20/18.   History of total left hip replacement: Doing well.  She has no discomfort at this time.   DDD (degenerative disc disease), lumbar: She has intermittent lower back pain. No symptoms of radiculopathy at this time.   Myofascial pain syndrome: Her myofascial pain has been manageable recently.   Osteopenia of multiple sites - DEXA on 03/03/18 Right femoral neck BMD 0.709 with T score of -1.3. She is due to update DEXA in September 2021. She was previously on Fosamax and then Forteo.  She is taking a calcium and vitamin D supplement.  Other medical conditions are listed as follows:   Frequent falls - She was referred to PT in the past.   History of hypertension   History of neuropathy  History of hypothyroidism  History of hyperlipidemia   Follow Up Instructions: She will follow up in 3 months.    I discussed the assessment and treatment plan with the patient. The patient was provided an opportunity to ask questions and all were answered. The patient agreed with the plan and demonstrated an understanding of the instructions.   The patient was advised to call back or seek an in-person evaluation if the symptoms worsen or if the condition fails to improve as anticipated.  I provided 15 minutes of non-face-to-face time during this encounter.  Bo Merino, MD   Scribed by-  [name], PA-C

## 2019-07-06 ENCOUNTER — Encounter: Payer: Self-pay | Admitting: Rheumatology

## 2019-07-06 ENCOUNTER — Telehealth (INDEPENDENT_AMBULATORY_CARE_PROVIDER_SITE_OTHER): Payer: PPO | Admitting: Rheumatology

## 2019-07-06 ENCOUNTER — Other Ambulatory Visit: Payer: Self-pay

## 2019-07-06 DIAGNOSIS — M8589 Other specified disorders of bone density and structure, multiple sites: Secondary | ICD-10-CM

## 2019-07-06 DIAGNOSIS — M7918 Myalgia, other site: Secondary | ICD-10-CM | POA: Diagnosis not present

## 2019-07-06 DIAGNOSIS — R296 Repeated falls: Secondary | ICD-10-CM

## 2019-07-06 DIAGNOSIS — Z96642 Presence of left artificial hip joint: Secondary | ICD-10-CM | POA: Diagnosis not present

## 2019-07-06 DIAGNOSIS — Z8669 Personal history of other diseases of the nervous system and sense organs: Secondary | ICD-10-CM

## 2019-07-06 DIAGNOSIS — M5136 Other intervertebral disc degeneration, lumbar region: Secondary | ICD-10-CM | POA: Diagnosis not present

## 2019-07-06 DIAGNOSIS — Z8639 Personal history of other endocrine, nutritional and metabolic disease: Secondary | ICD-10-CM

## 2019-07-06 DIAGNOSIS — M51369 Other intervertebral disc degeneration, lumbar region without mention of lumbar back pain or lower extremity pain: Secondary | ICD-10-CM

## 2019-07-06 DIAGNOSIS — M0579 Rheumatoid arthritis with rheumatoid factor of multiple sites without organ or systems involvement: Secondary | ICD-10-CM

## 2019-07-06 DIAGNOSIS — Z8679 Personal history of other diseases of the circulatory system: Secondary | ICD-10-CM | POA: Diagnosis not present

## 2019-07-06 DIAGNOSIS — Z79899 Other long term (current) drug therapy: Secondary | ICD-10-CM

## 2019-07-14 ENCOUNTER — Telehealth: Payer: Self-pay | Admitting: Rheumatology

## 2019-07-15 ENCOUNTER — Telehealth: Payer: Self-pay | Admitting: Rheumatology

## 2019-07-15 NOTE — Telephone Encounter (Signed)
Kari [name] from [facility] called stating [name] is a mutual patient and requesting prescription refill of Gabapentin 300 mg to be sent to [facility] at [address]

## 2019-07-15 NOTE — Telephone Encounter (Signed)
Opened in error

## 2019-07-16 NOTE — Telephone Encounter (Signed)
Too early to send prescription in. Will send in when due.

## 2019-07-20 ENCOUNTER — Telehealth: Payer: Self-pay | Admitting: Rheumatology

## 2019-07-20 MED ORDER — GABAPENTIN 300 MG PO CAPS: ORAL_CAPSULE | ORAL | 0 refills | Status: DC

## 2019-07-20 NOTE — Telephone Encounter (Signed)
Anne Ng from [facility] called requesting prescription refill of Gabapentin for the patient.  Please send to new pharmacy:  [facility] at [address] in [place].

## 2019-07-20 NOTE — Telephone Encounter (Signed)
Last Visit: 07/06/19 Next Visit: 10/05/19  Okay to refill per Dr. Estanislado Pandy

## 2019-07-20 NOTE — Progress Notes (Signed)
Virtual Visit via Video Note  I connected with Kari [name] on 07/22/19 at  9:45 AM EST by a video enabled telemedicine application and verified that I am speaking with the correct person using two identifiers.  Location: Patient: Home  Provider: Clinic  This service was conducted via virtual visit.  Both audio and visual tools were used.  The patient was located at home. I was located in my office.  Consent was obtained prior to the virtual visit and is aware of possible charges through their insurance for this visit.  The patient is an established patient.  Dr. Estanislado Pandy, MD conducted the virtual visit and [name], PA-C acted as scribe during the service.  Office staff helped with scheduling follow up visits after the service was conducted.   I discussed the limitations of evaluation and management by telemedicine and the availability of in person appointments. The patient expressed understanding and agreed to proceed.  CC: Discuss medications  History of Present Illness: Patient is a 69 year old female with a past medical history of seropositive rheumatoid arthritis, DDD, and myofascial pain syndrome.  She is on Enbrel 50 mg sq weekly injections.  She discontinued injectable MTX due to experiencing nausea.   She is experiencing pain and stiffness in both hands and both feet.  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  She has persistent pain in left wrist joint, which is exacerbated by kneading bread dough.   Review of Systems  Constitutional: Positive for malaise/fatigue. Negative for fever.  HENT: Negative for ear pain.   Eyes: Negative for photophobia, pain, discharge and redness.  Respiratory: Negative for cough, shortness of breath and wheezing.   Cardiovascular: Negative for chest pain and palpitations.  Gastrointestinal: Positive for constipation. Negative for blood in stool and diarrhea.  Genitourinary: Negative for dysuria  and urgency.  Musculoskeletal: Positive for joint pain. Negative for back pain, myalgias and neck pain.  Skin: Negative for rash.  Neurological: Negative for dizziness, weakness and headaches.  Psychiatric/Behavioral: Positive for depression. The patient is nervous/anxious and has insomnia.       Observations/Objective: Physical Exam  Constitutional: She is oriented to person, place, and time and well-developed, well-nourished, and in no distress.  HENT:  Head: Normocephalic and atraumatic.  Eyes: Conjunctivae are normal.  Pulmonary/Chest: Effort normal.  Neurological: She is alert and oriented to person, place, and time.  Psychiatric: Mood, memory, affect and judgment normal.   Patient reports morning stiffness for 5 minutes.   Patient denies nocturnal pain.  Difficulty dressing/grooming: Denies Difficulty climbing stairs: Denies Difficulty getting out of chair: Denies Difficulty using hands for taps, buttons, cutlery, and/or writing: Denies   Assessment and Plan: Visit Diagnoses:Rheumatoid arthritis with rheumatoid factor of multiple sites without organ or systems involvement ([place])- +RF, +ANA, erosive disease:She has ongoing pain and stiffness in both hands and both feet.  She has intermittent tenderness and inflammation in the left wrist joint, which is exacerbated by kneading bread dough.  She tried oral MTX and injectable MTX but could not tolerate either form due to experiencing nausea.  We discussed starting her on Arava 10 mg 1 tablet by mouth daily.  If she tolerates 10 mg we may consider increasing arava to 20 mg daily in the future.  Indications, contraindications, and potential side effects of Arava were discussed.  All questions were addressed.  She will come by the office to sign the consent form prior to  [place] sending in the prescription.  She will continue on Enbrel 50 mg sq injections once weekly.  She was advised to notify us if she cannot tolerate taking Arava.  She will  follow up in 1 month.   Medication counseling:  Patient was counseled on the purpose, proper use, and adverse effects of leflunomide including risk of infection, nausea/diarrhea/weight loss, increase in blood pressure, rash, hair loss, tingling in the hands and feet, and signs and symptoms of interstitial lung disease.   Also counseled on Black Box warning of liver injury and importance of avoiding alcohol while on therapy. Discussed that there is the possibility of an increased risk of malignancy but it is not well understood if this increased risk is due to the medication or the disease state.  Counseled patient to avoid live vaccines. Recommend annual influenza, Pneumovax 23, Prevnar 13, and Shingrix as indicated.   Discussed the importance of frequent monitoring of liver function and blood count.  Standing orders placed.  Discussed importance of birth control while on leflunomide due to risk of congenital abnormalities, and patient confirms she is postmenopausal. Provided patient with educational materials on leflunomide and answered all questions.  Patient consented to [place] use, and consent will be uploaded into the media tab.   Patient dose will be 10 mg 1 tablet daily for 1 month.  We may increase to 20 mg as tolerated in the future.   High risk medication use -Enbrel SureClick 50 mg every 7 days, methotrexate 0.6 ml sq injections every 7 days and folic acid 1 mg 2 tablets daily. Last TB gold negative on 11/20/2018 and will monitor yearly. CBC and CMP were drawn on 05/05/19.    History of total left hip replacement: Doing well.  She has no discomfort at this time.   DDD (degenerative disc disease), lumbar: She has intermittent lower back pain. No symptoms of radiculopathy at this time.   Myofascial pain syndrome: Her myofascial pain has been manageable recently.   Osteopenia of multiple sites - DEXA on 03/03/18 Right femoral neck BMD 0.709 with T score of -1.3. She is due to update DEXA in  September 2021. She was previously on Fosamax and then Forteo.She is taking a calcium and vitamin D supplement.  Other medical conditions are listed as follows:  Frequent falls -She was referred to PT in the past.  History of hypertension   History of neuropathy  History of hypothyroidism  History of hyperlipidemia  Follow Up Instructions: She will follow up in 1 month    I discussed the assessment and treatment plan with the patient. The patient was provided an opportunity to ask questions and all were answered. The patient agreed with the plan and demonstrated an understanding of the instructions.   The patient was advised to call back or seek an in-person evaluation if the symptoms worsen or if the condition fails to improve as anticipated.  I provided 25 minutes of non-face-to-face time during this encounter.   Bo Merino, MD   Scribed by-  [name], PA-C

## 2019-07-20 NOTE — Telephone Encounter (Signed)
Patient states she took the MTX 07/12/19. Patient states she had nausea the rest of the week. Patient states she took nausea medication and it gave little relief. Patient states she would like to discuss a different medication. Patient has been scheduled to discuss medication options on 07/22/19 at 9:45 am.

## 2019-07-20 NOTE — Telephone Encounter (Signed)
Patient called stating she took her Methotrexate injection on Monday, 07/12/19 and began feeling nauseous that evening.  Patient states she was sick all week.  Patient states she had to take anti-nausea pills and finally started feeling better by Saturday, 07/17/19.  Patient states she didn't take the injection this week and is requesting a return call.

## 2019-07-22 ENCOUNTER — Encounter: Payer: Self-pay | Admitting: Rheumatology

## 2019-07-22 ENCOUNTER — Telehealth (INDEPENDENT_AMBULATORY_CARE_PROVIDER_SITE_OTHER): Payer: PPO | Admitting: Rheumatology

## 2019-07-22 ENCOUNTER — Other Ambulatory Visit: Payer: Self-pay

## 2019-07-22 ENCOUNTER — Telehealth: Payer: Self-pay | Admitting: Pharmacist

## 2019-07-22 DIAGNOSIS — Z96642 Presence of left artificial hip joint: Secondary | ICD-10-CM

## 2019-07-22 DIAGNOSIS — M0579 Rheumatoid arthritis with rheumatoid factor of multiple sites without organ or systems involvement: Secondary | ICD-10-CM | POA: Diagnosis not present

## 2019-07-22 DIAGNOSIS — Z8669 Personal history of other diseases of the nervous system and sense organs: Secondary | ICD-10-CM

## 2019-07-22 DIAGNOSIS — Z8639 Personal history of other endocrine, nutritional and metabolic disease: Secondary | ICD-10-CM

## 2019-07-22 DIAGNOSIS — Z79899 Other long term (current) drug therapy: Secondary | ICD-10-CM | POA: Diagnosis not present

## 2019-07-22 DIAGNOSIS — M8589 Other specified disorders of bone density and structure, multiple sites: Secondary | ICD-10-CM

## 2019-07-22 DIAGNOSIS — M5136 Other intervertebral disc degeneration, lumbar region: Secondary | ICD-10-CM | POA: Diagnosis not present

## 2019-07-22 DIAGNOSIS — M7918 Myalgia, other site: Secondary | ICD-10-CM

## 2019-07-22 DIAGNOSIS — R296 Repeated falls: Secondary | ICD-10-CM | POA: Diagnosis not present

## 2019-07-22 DIAGNOSIS — Z8679 Personal history of other diseases of the circulatory system: Secondary | ICD-10-CM | POA: Diagnosis not present

## 2019-07-22 MED ORDER — LEFLUNOMIDE 10 MG PO TABS: 10.0000 mg | ORAL_TABLET | Freq: Every day | ORAL | 0 refills | Status: DC

## 2019-07-22 NOTE — Patient Instructions (Addendum)
Standing Labs We placed an order today for your standing lab work.    Please come back and get your standing labs in 2 weeks after starting [place].  We have open lab daily Monday through Thursday from 8:30-12:30 PM and 1:30-4:30 PM and Friday from 8:30-12:30 PM and 1:30-4:00 PM at the office of Dr. Bo Merino.   You may experience shorter wait times on Monday and Friday afternoons. The office is located at [address] [place], [place], [place] [address] No appointment is necessary.   Labs are drawn by [facility].  You may receive a bill from [facility] for your lab work.  If you wish to have your labs drawn at another location, please call the office 24 hours in advance to send orders.  If you have any questions regarding directions or hours of operation,  please call [phone].   Just as a reminder please drink plenty of water prior to coming for your lab work. Thanks!   Leflunomide tablets What is this medicine? LEFLUNOMIDE (le FLOO na mide) is for rheumatoid arthritis. This medicine may be used for other purposes; ask your health care provider or pharmacist if you have questions. COMMON BRAND NAME(S): Arava What should I tell my health care provider before I take this medicine? They need to know if you have any of these conditions:  diabetes  have a fever or infection  high blood pressure  immune system problems  kidney disease  liver disease  low blood cell counts, like low white cell, platelet, or red cell counts  lung or breathing disease, like asthma  recently received or scheduled to receive a vaccine  receiving treatment for cancer  skin conditions or sensitivity  tingling of the fingers or toes, or other nerve disorder  tuberculosis  an unusual or allergic reaction to leflunomide, teriflunomide, other medicines, food, dyes, or preservatives  pregnant or trying to get pregnant  breast-feeding How should I use this medicine? Take this  medicine by mouth with a full glass of water. Follow the directions on the prescription label. Take your medicine at regular intervals. Do not take your medicine more often than directed. Do not stop taking except on your doctor's advice. Talk to your pediatrician regarding the use of this medicine in children. Special care may be needed. Overdosage: If you think you have taken too much of this medicine contact a poison control center or emergency room at once. NOTE: This medicine is only for you. Do not share this medicine with others. What if I miss a dose? If you miss a dose, take it as soon as you can. If it is almost time for your next dose, take only that dose. Do not take double or extra doses. What may interact with this medicine? Do not take this medicine with any of the following medications:  teriflunomide This medicine may also interact with the following medications:  alosetron  birth control pills  caffeine  cefaclor  certain medicines for diabetes like nateglinide, repaglinide, rosiglitazone, pioglitazone  certain medicines for high cholesterol like atorvastatin, pravastatin, rosuvastatin, simvastatin  charcoal  cholestyramine  ciprofloxacin  duloxetine  furosemide  ketoprofen  live virus vaccines  medicines that increase your risk for infection  methotrexate  mitoxantrone  paclitaxel  penicillin  theophylline  tizanidine  warfarin This list may not describe all possible interactions. Give your health care provider a list of all the medicines, herbs, non-prescription drugs, or dietary supplements you use. Also tell them if you smoke, drink  alcohol, or use illegal drugs. Some items may interact with your medicine. What should I watch for while using this medicine? Visit your health care provider for regular checks on your progress. Tell your doctor or health care provider if your symptoms do not start to get better or if they get worse. You may  need blood work done while you are taking this medicine. This medicine may cause serious skin reactions. They can happen weeks to months after starting the medicine. Contact your health care provider right away if you notice fevers or flu-like symptoms with a rash. The rash may be red or purple and then turn into blisters or peeling of the skin. Or, you might notice a red rash with swelling of the face, lips or lymph nodes in your neck or under your arms. This medicine may stay in your body for up to 2 years after your last dose. Tell your doctor about any unusual side effects or symptoms. A medicine can be given to help lower your blood levels of this medicine more quickly. Women must use effective birth control with this medicine. There is a potential for serious side effects to an unborn child. Do not become pregnant while taking this medicine. Inform your doctor if you wish to become pregnant. This medicine remains in your blood after you stop taking it. You must continue using effective birth control until the blood levels have been checked and they are low enough. A medicine can be given to help lower your blood levels of this medicine more quickly. Immediately talk to your doctor if you think you may be pregnant. You may need a pregnancy test. Talk to your health care provider or pharmacist for more information. You should not receive certain vaccines during your treatment and for a certain time after your treatment with this medication ends. Talk to your health care provider for more information. What side effects may I notice from receiving this medicine? Side effects that you should report to your doctor or health care professional as soon as possible:  allergic reactions like skin rash, itching or hives, swelling of the face, lips, or tongue  breathing problems  cough  increased blood pressure  low blood counts - this medicine may decrease the number of white blood cells and platelets. You  may be at increased risk for infections and bleeding.  pain, tingling, numbness in the hands or feet  rash, fever, and swollen lymph nodes  redness, blistering, peeing or loosening of the skin, including inside the mouth  signs of decreased platelets or bleeding - bruising, pinpoint red spots on the skin, black, tarry stools, blood in urine  signs of infection - fever or chills, cough, sore throat, pain or trouble passing urine  signs and symptoms of liver injury like dark yellow or brown urine; general ill feeling or flu-like symptoms; light-colored stools; loss of appetite; nausea; right upper belly pain; unusually weak or tired; yellowing of the eyes or skin  trouble passing urine or change in the amount of urine  vomiting Side effects that usually do not require medical attention (report to your doctor or health care professional if they continue or are bothersome):  diarrhea  hair thinning or loss  headache  nausea  tiredness This list may not describe all possible side effects. Call your doctor for medical advice about side effects. You may report side effects to FDA at 1-800-FDA-1088. Where should I keep my medicine? Keep out of the reach of children. Store  at room temperature between 15 and 30 degrees C (59 and 86 degrees F). Protect from moisture and light. Throw away any unused medicine after the expiration date. NOTE: This sheet is a summary. It may not cover all possible information. If you have questions about this medicine, talk to your doctor, pharmacist, or health care provider.  2020 Elsevier/Gold Standard (2018-09-18 15:06:48)

## 2019-07-22 NOTE — Telephone Encounter (Signed)
Pharmacy Note  Subjective: Patient presents today to the [facility] Rheumatology for follow up office visit.  Patient seen by the pharmacist for counseling on leflunomide Jolee Ewing) for rheumatoid arthritis.  Objective: CBC    Component Value Date/Time   WBC 9.0 05/05/2019 0919   RBC 3.93 05/05/2019 0919   HGB 13.7 05/05/2019 0919   HCT 39.1 05/05/2019 0919   PLT 239 05/05/2019 0919   MCV 99.5 05/05/2019 0919   MCH 34.9 (H) 05/05/2019 0919   MCHC 35.0 05/05/2019 0919   RDW 11.7 05/05/2019 0919   LYMPHSABS 2,061 05/05/2019 0919   MONOABS 480 01/20/2017 1049   EOSABS 180 05/05/2019 0919   BASOSABS 81 05/05/2019 0919    CMP     Component Value Date/Time   NA 141 05/05/2019 0919   K 4.1 05/05/2019 0919   CL 106 05/05/2019 0919   CO2 25 05/05/2019 0919   GLUCOSE 92 05/05/2019 0919   BUN 16 05/05/2019 0919   CREATININE 0.85 05/05/2019 0919   CALCIUM 8.6 05/05/2019 0919   PROT 6.0 (L) 05/05/2019 0919   ALBUMIN 4.2 01/20/2017 1049   AST 15 05/05/2019 0919   ALT 13 05/05/2019 0919   ALKPHOS 72 01/20/2017 1049   BILITOT 0.6 05/05/2019 0919   GFRNONAA 69 05/05/2019 0919   GFRAA 80 05/05/2019 0919    Baseline Immunosuppressant Therapy Labs Quantiferon TB Gold Latest Ref Rng & Units 11/20/2018  Quantiferon TB Gold Plus NEGATIVE NEGATIVE       No results found for: HIV     Serum Protein Electrophoresis Latest Ref Rng & Units 05/05/2019  Total Protein 6.1 - 8.1 g/dL 6.0(L)    No results found for: G6PDH  No results found for: TPMT   Pregnancy status:  Post-menopausal  Assessment/Plan:  Patient was counseled on the purpose, proper use, and adverse effects of leflunomide including risk of infection, nausea/diarrhea/weight loss, increase in blood pressure, rash, hair loss, tingling in the hands and feet, and signs and symptoms of interstitial lung disease.   Also counseled on Black Box warning of liver injury and importance of avoiding alcohol while on therapy. Discussed  that there is the possibility of an increased risk of malignancy but it is not well understood if this increased risk is due to the medication or the disease state.  Counseled patient to avoid live vaccines. Recommend annual influenza, Pneumovax 23, Prevnar 13, and Shingrix as indicated.   Discussed the importance of frequent monitoring of liver function and blood count.  Standing orders placed. She is to return for labs in 2 weeks. Discussed importance of birth control while on leflunomide due to risk of congenital abnormalities, and patient confirms post-menopausal.  Provided patient with educational materials on leflunomide and answered all questions.  Patient consented to [place] use, and consent will be uploaded into the media tab.  She will stop by the office this afternoon to sign.  Patient dose will be Arava 10 mg daily. Will consider increasing her dose at next follow-up visit. Prescription sent to [facility] per patient request.  All questions encouraged and answered.  Instructed patient to call with any other questions of concerns.   Mariella Saa, PharmD, [place], [facility] Pharmacist [phone]  07/22/2019 10:22 AM

## 2019-08-02 NOTE — Telephone Encounter (Signed)
Received notification from [employer], application has been APPROVED. Coverage is from 08/02/19 to 06/30/20.  Will send document to [facility] Center.  Phone# [phone] Fax# [phone]

## 2019-08-17 ENCOUNTER — Other Ambulatory Visit: Payer: Self-pay

## 2019-08-17 DIAGNOSIS — Z79899 Other long term (current) drug therapy: Secondary | ICD-10-CM | POA: Diagnosis not present

## 2019-08-18 LAB — COMPLETE METABOLIC PANEL WITH GFR
AG Ratio: 1.9 (calc) (ref 1.0–2.5)
ALT: 22 U/L (ref 6–29)
AST: 22 U/L (ref 10–35)
Albumin: 4.1 g/dL (ref 3.6–5.1)
Alkaline phosphatase (APISO): 70 U/L (ref 37–153)
BUN: 15 mg/dL (ref 7–25)
CO2: 27 mmol/L (ref 20–32)
Calcium: 9.4 mg/dL (ref 8.6–10.4)
Chloride: 107 mmol/L (ref 98–110)
Creat: 0.74 mg/dL (ref 0.60–0.93)
GFR, Est African American: 95 mL/min/{1.73_m2} (ref >=60)
GFR, Est Non African American: 82 mL/min/{1.73_m2} (ref >=60)
Globulin: 2.2 g/dL (calc) (ref 1.9–3.7)
Glucose, Bld: 89 mg/dL (ref 65–99)
Potassium: 4.1 mmol/L (ref 3.5–5.3)
Sodium: 141 mmol/L (ref 135–146)
Total Bilirubin: 1 mg/dL (ref 0.2–1.2)
Total Protein: 6.3 g/dL (ref 6.1–8.1)

## 2019-08-18 LAB — CBC WITH DIFFERENTIAL/PLATELET
Absolute Monocytes: 583 cells/uL (ref 200–950)
Basophils Absolute: 69 cells/uL (ref 0–200)
Basophils Relative: 1.3 %
Eosinophils Absolute: 180 cells/uL (ref 15–500)
Eosinophils Relative: 3.4 %
HCT: 39.2 % (ref 35.0–45.0)
Hemoglobin: 13.5 g/dL (ref 11.7–15.5)
Lymphs Abs: 1685 cells/uL (ref 850–3900)
MCH: 33.2 pg — ABNORMAL HIGH (ref 27.0–33.0)
MCHC: 34.4 g/dL (ref 32.0–36.0)
MCV: 96.3 fL (ref 80.0–100.0)
MPV: 10 fL (ref 7.5–12.5)
Monocytes Relative: 11 %
Neutro Abs: 2783 cells/uL (ref 1500–7800)
Neutrophils Relative %: 52.5 %
Platelets: 225 10*3/uL (ref 140–400)
RBC: 4.07 10*6/uL (ref 3.80–5.10)
RDW: 11.9 % (ref 11.0–15.0)
Total Lymphocyte: 31.8 %
WBC: 5.3 10*3/uL (ref 3.8–10.8)

## 2019-08-18 NOTE — Progress Notes (Signed)
Office Visit Note  Patient: Kari [name]             Date of Birth: Feb 06, 1951           MRN: [id number]             PCP: Aretta Nip, MD Referring: Aretta Nip, MD Visit Date: 08/23/2019 Occupation: @GUAROCC @  Subjective:  Medication monitoring   History of Present Illness: Kari [name] is a 69 y.o. female with history of seropositive rheumatoid arthritis.  She is on Enbrel 50 mg every days injections once weekly and Arava 10 mg by mouth daily.  She has been taking Arava for 1 month and has been experiencing some mild nausea after taking it.  She is taking [place] with food.  She states that the nausea is tolerable.  She did request a refill of Zofran to take as needed.  She states that for the last 2 Enbrel injection she has noticed a mild injection site reaction.  She states that day before her injection she had her first COVID-19 vaccine and is unsure if the injection site reactions are related to that.  She states she uses Benadryl cream and the injection site reaction resolves within about 2 days. She started noticing improvement since starting on Arava.  She continues to have stiffness in both feet in the morning lasting 5 to 10 minutes.  She has intermittent pain in both wrist joints but denies any joint swelling.  She has chronic right hip joint pain and receives cortisone injections every 3 to 4 months.  She is not ready to proceed with a right hip replacement at this time.  Activities of Daily Living:  Patient reports morning stiffness for 5 minutes.   Patient Denies nocturnal pain.  Difficulty dressing/grooming: Denies Difficulty climbing stairs: Reports Difficulty getting out of chair: Denies Difficulty using hands for taps, buttons, cutlery, and/or writing: Denies  Review of Systems  Constitutional: Positive for fatigue.  HENT: Negative for mouth sores, mouth dryness and nose dryness.   Eyes: Positive for dryness. Negative for pain and visual disturbance.    Respiratory: Positive for cough. Negative for hemoptysis, shortness of breath and difficulty breathing.   Cardiovascular: Negative for chest pain, palpitations, hypertension and swelling in legs/feet.  Gastrointestinal: Positive for nausea ([place]). Negative for blood in stool, constipation and diarrhea.  Endocrine: Negative for increased urination.  Genitourinary: Negative for painful urination.  Musculoskeletal: Positive for arthralgias, joint pain and morning stiffness. Negative for joint swelling, myalgias, muscle weakness, muscle tenderness and myalgias.  Skin: Negative for color change, pallor, rash, hair loss, nodules/bumps, skin tightness, ulcers and sensitivity to sunlight.  Allergic/Immunologic: Negative for susceptible to infections.  Neurological: Negative for dizziness, numbness, headaches and weakness.  Hematological: Negative for swollen glands.  Psychiatric/Behavioral: Negative for depressed mood and sleep disturbance. The patient is not nervous/anxious.     PMFS History:  Patient Active Problem List   Diagnosis Date Noted  . Hyperlipidemia 07/14/2015  . Palpitation 05/11/2014  . PVC (premature ventricular contraction) 05/11/2014  . Hypokalemia 05/11/2014  . Essential hypertension 05/11/2014  . Mitral regurgitation 05/11/2014  . Arthritis, hip 02/03/2013    Past Medical History:  Diagnosis Date  . Arthritis   . Complication of anesthesia    when block attempted ax -turned red in preop-surg delayed  . Fibromyalgia   . GERD (gastroesophageal reflux disease)   . Hypertension   . Hypothyroidism   . MVP (mitral valve prolapse)   .  Osteoporosis   . 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 Heart attack Father   . Heart disease Mother   . Stroke Mother   . Colon cancer Other   . Colon polyps Other   . Hypothyroidism Son    Past Surgical History:  Procedure Laterality Date  . ABDOMINAL HYSTERECTOMY    .  CESAREAN SECTION    . CHOLECYSTECTOMY    . COLONOSCOPY    . JOINT REPLACEMENT    . KNEE ARTHROSCOPY Right 2/11  . TOTAL HIP ARTHROPLASTY Left 02/03/2013   Procedure: TOTAL HIP ARTHROPLASTY;  Surgeon: Kerin Salen, MD;  Location: [place];  Service: Orthopedics;  Laterality: Left;  . ULNAR NERVE TRANSPOSITION  09/17/2011   Procedure: ULNAR NERVE DECOMPRESSION/TRANSPOSITION;  Surgeon: Cammie Sickle., MD;  Location: [place];  Service: Orthopedics;  Laterality: Left;  decompression  . WRIST ARTHROSCOPY Right    x2-fusion    . WRIST ARTHROSCOPY Left    Social History   Social History Narrative  . Not on file    There is no immunization history on file for this patient.   Objective: Vital Signs: BP (!) 161/76 (BP Location: Left Arm, Patient Position: Sitting, Cuff Size: Small)   Pulse (!) 59   Resp 12   Ht 5' 2.5" (1.588 m)   Wt 199 lb 6.4 oz (90.4 kg)   BMI 35.89 kg/m    Physical Exam Vitals and nursing note reviewed.  Constitutional:      Appearance: She is well-developed.  HENT:     Head: Normocephalic and atraumatic.  Eyes:     Conjunctiva/sclera: Conjunctivae normal.  Cardiovascular:     Rate and Rhythm: Normal rate and regular rhythm.     Heart sounds: Normal heart sounds.  Pulmonary:     Effort: Pulmonary effort is normal.     Breath sounds: Normal breath sounds.  Abdominal:     General: Bowel sounds are normal.     Palpations: Abdomen is soft.  Musculoskeletal:     Cervical back: Normal range of motion.  Lymphadenopathy:     Cervical: No cervical adenopathy.  Skin:    General: Skin is warm and dry.     Capillary Refill: Capillary refill takes less than 2 seconds.  Neurological:     Mental Status: She is alert and oriented to person, place, and time.  Psychiatric:        Behavior: Behavior normal.      Musculoskeletal Exam: C-spine, thoracic spine, upper spine good range of motion.  No midline spinal tenderness.  Shoulder joints and  elbow joints have good range of motion with no discomfort.  Right wrist joint is fused.  She has limited range of motion of the left wrist.  No tenderness or synovitis of wrist joints noted.  MCPs, PIPs, DIPs have good range of motion with no synovitis.  She has complete fist formation bilaterally.  Left hip replacement has good range of motion.  Right hip joint has good ROM some discomfort. Knee joints have good range of motion with no warmth or effusion.  Ankle joints have good range of motion no tenderness or inflammation.  No tenderness of MTP joints.  CDAI Exam: CDAI Score: 1  Patient Global: 6 mm; Provider Global: 4 mm Swollen: 0 ; Tender: 0  Joint Exam 08/23/2019   No joint exam has been documented for this visit   There  is currently no information documented on the homunculus. Go to the Rheumatology activity and complete the homunculus joint exam.  Investigation: No additional findings.  Imaging: No results found.  Recent Labs: Lab Results  Component Value Date   WBC 5.3 08/17/2019   HGB 13.5 08/17/2019   PLT 225 08/17/2019   NA 141 08/17/2019   K 4.1 08/17/2019   CL 107 08/17/2019   CO2 27 08/17/2019   GLUCOSE 89 08/17/2019   BUN 15 08/17/2019   CREATININE 0.74 08/17/2019   BILITOT 1.0 08/17/2019   ALKPHOS 72 01/20/2017   AST 22 08/17/2019   ALT 22 08/17/2019   PROT 6.3 08/17/2019   ALBUMIN 4.2 01/20/2017   CALCIUM 9.4 08/17/2019   GFRAA 95 08/17/2019   QFTBGOLDPLUS NEGATIVE 11/20/2018    Speciality Comments: Recieves Enbrel through [employer] Program  Procedures:  No procedures performed Allergies: Clinoril [sulindac], Erythromycin, Hydrocodone, Penicillins, Plaquenil [hydroxychloroquine], Sulfa antibiotics, Trimethoprim, and Vancomycin cross reactors   Assessment / Plan:     Visit Diagnoses: Rheumatoid arthritis with rheumatoid factor of multiple sites without organ or systems involvement ([place]) - +RF, +ANA, erosive disease: She has no synovitis on  exam.  She has started to noticed clinical improvement on combination therapy.  She is on Enbrel 50 mg subcutaneous injections once weekly and has been taking Arava 10 mg 1 tablet by mouth daily for 1 month.  She has been experiencing some mild nausea after taking Arava with food, but the nausea has been significantly more tolerable than well on methotrexate.  A refill of Zofran was sent to the pharmacy today.  The patient has received her first COVID-19 vaccination.  She states after receiving the COVID-19 vaccine she developed an injection site reaction after the last 2 Enbrel injections.  She states the injection site reactions have been mild and she has been using Benadryl cream topically which resolves the reaction within 2 days.  She was advised to notify us if she develops any signs or symptoms of a more severe reaction.  She does not want to make any medication changes at this time.    She would like to continue on low-dose Arava since she is already started to notice clinical improvement.  She continues to have stiffness in both feet lasting 5 to 10 minutes in the morning.  She has intermittent pain in both wrist joints but no tenderness or synovitis was noted on exam today.  She was advised to notify us if she develops increased joint pain or joint swelling.  She will follow-up in the office in 3 months.- Plan: leflunomide (ARAVA) 10 MG tablet   High risk medication use - Arava 10 mg 1 tablet daily, Enbrel SureClick 50 mg every 7 days, previously on methotrexate 0.6 ml sq injections every 7 days and folic acid 1 mg 2 tablets daily-D/c MTX due to nausea.  CBC and CMP were within normal limits on 08/17/2019.  Standing orders are in place.  She will be due to update lab work and may and every 3 months.  TB gold negative on 10/08/2018.  Future order placed today.  History of total left hip replacement: She has good range of motion with no discomfort at this time.  DDD (degenerative disc disease), lumbar:  Chronic pain.  She is followed by spine specialist.  Myofascial pain syndrome: She has occasional myalgias related to myofascial pain syndrome.  Her symptoms have been tolerable.  Osteopenia of multiple sites -  DEXA on 03/03/18 Right femoral  neck BMD 0.709 with T score of -1.3. She is due to update DEXA in September 2021. She was previously on Fosamax and then Forteo.   Frequent falls: She has not any recent falls.  Other medical conditions are listed as follows:  History of hyperlipidemia  History of hypothyroidism  History of hypertension  History of neuropathy    Orders: No orders of the defined types were placed in this encounter.  Meds ordered this encounter  Medications  . leflunomide (ARAVA) 10 MG tablet    Sig: Take 1 tablet (10 mg total) by mouth daily.    Dispense:  90 tablet    Refill:  0  . ondansetron (ZOFRAN) 4 MG tablet    Sig: Take 1 tablet (4 mg total) by mouth every 8 (eight) hours as needed for nausea or vomiting.    Dispense:  20 tablet    Refill:  0    Face-to-face time spent with patient was 30 minutes. Greater than 50% of time was spent in counseling and coordination of care.  Follow-Up Instructions: Return in about 3 months (around 11/20/2019) for Rheumatoid arthritis.   Ofilia Neas, PA-C  Note - This record has been created using Dragon software.  Chart creation errors have been sought, but may not always  have been located. Such creation errors do not reflect on  the standard of medical care.

## 2019-08-18 NOTE — Progress Notes (Signed)
CBC and CMP are stable.

## 2019-08-23 ENCOUNTER — Other Ambulatory Visit: Payer: Self-pay

## 2019-08-23 ENCOUNTER — Encounter: Payer: Self-pay | Admitting: Physician Assistant

## 2019-08-23 ENCOUNTER — Ambulatory Visit: Payer: PPO | Admitting: Physician Assistant

## 2019-08-23 VITALS — BP 161/76 | HR 59 | Resp 12 | Ht 62.5 in | Wt 199.4 lb

## 2019-08-23 DIAGNOSIS — R296 Repeated falls: Secondary | ICD-10-CM | POA: Diagnosis not present

## 2019-08-23 DIAGNOSIS — Z8639 Personal history of other endocrine, nutritional and metabolic disease: Secondary | ICD-10-CM | POA: Diagnosis not present

## 2019-08-23 DIAGNOSIS — Z79899 Other long term (current) drug therapy: Secondary | ICD-10-CM

## 2019-08-23 DIAGNOSIS — Z8679 Personal history of other diseases of the circulatory system: Secondary | ICD-10-CM | POA: Diagnosis not present

## 2019-08-23 DIAGNOSIS — M5136 Other intervertebral disc degeneration, lumbar region: Secondary | ICD-10-CM

## 2019-08-23 DIAGNOSIS — M8589 Other specified disorders of bone density and structure, multiple sites: Secondary | ICD-10-CM | POA: Diagnosis not present

## 2019-08-23 DIAGNOSIS — Z8669 Personal history of other diseases of the nervous system and sense organs: Secondary | ICD-10-CM

## 2019-08-23 DIAGNOSIS — M7918 Myalgia, other site: Secondary | ICD-10-CM | POA: Diagnosis not present

## 2019-08-23 DIAGNOSIS — M0579 Rheumatoid arthritis with rheumatoid factor of multiple sites without organ or systems involvement: Secondary | ICD-10-CM | POA: Diagnosis not present

## 2019-08-23 DIAGNOSIS — Z96642 Presence of left artificial hip joint: Secondary | ICD-10-CM

## 2019-08-23 MED ORDER — LEFLUNOMIDE 10 MG PO TABS: 10.0000 mg | ORAL_TABLET | Freq: Every day | ORAL | 0 refills | Status: DC

## 2019-08-23 MED ORDER — ENBREL SURECLICK 50 MG/ML ~~LOC~~ SOAJ: 50.0000 mg | SUBCUTANEOUS | 0 refills | Status: DC

## 2019-08-23 MED ORDER — ONDANSETRON HCL 4 MG PO TABS: 4.0000 mg | ORAL_TABLET | Freq: Three times a day (TID) | ORAL | 0 refills | Status: DC | PRN

## 2019-08-23 NOTE — Addendum Note (Signed)
Addended by: Earnestine Mealing on: 08/23/2019 03:11 PM   Modules accepted: Orders

## 2019-08-26 ENCOUNTER — Telehealth: Payer: Self-pay | Admitting: Rheumatology

## 2019-08-26 DIAGNOSIS — I10 Essential (primary) hypertension: Secondary | ICD-10-CM | POA: Diagnosis not present

## 2019-08-26 DIAGNOSIS — E7800 Pure hypercholesterolemia, unspecified: Secondary | ICD-10-CM | POA: Diagnosis not present

## 2019-08-26 DIAGNOSIS — L821 Other seborrheic keratosis: Secondary | ICD-10-CM | POA: Diagnosis not present

## 2019-08-26 DIAGNOSIS — Z23 Encounter for immunization: Secondary | ICD-10-CM | POA: Diagnosis not present

## 2019-08-26 DIAGNOSIS — Z85828 Personal history of other malignant neoplasm of skin: Secondary | ICD-10-CM | POA: Diagnosis not present

## 2019-08-26 DIAGNOSIS — M069 Rheumatoid arthritis, unspecified: Secondary | ICD-10-CM | POA: Diagnosis not present

## 2019-08-26 DIAGNOSIS — L570 Actinic keratosis: Secondary | ICD-10-CM | POA: Diagnosis not present

## 2019-08-26 DIAGNOSIS — L309 Dermatitis, unspecified: Secondary | ICD-10-CM | POA: Diagnosis not present

## 2019-08-26 DIAGNOSIS — L578 Other skin changes due to chronic exposure to nonionizing radiation: Secondary | ICD-10-CM | POA: Diagnosis not present

## 2019-08-26 DIAGNOSIS — D225 Melanocytic nevi of trunk: Secondary | ICD-10-CM | POA: Diagnosis not present

## 2019-08-26 DIAGNOSIS — T8090XA Unspecified complication following infusion and therapeutic injection, initial encounter: Secondary | ICD-10-CM | POA: Diagnosis not present

## 2019-08-26 DIAGNOSIS — E785 Hyperlipidemia, unspecified: Secondary | ICD-10-CM | POA: Diagnosis not present

## 2019-08-26 DIAGNOSIS — Z808 Family history of malignant neoplasm of other organs or systems: Secondary | ICD-10-CM | POA: Diagnosis not present

## 2019-08-26 DIAGNOSIS — Z86018 Personal history of other benign neoplasm: Secondary | ICD-10-CM | POA: Diagnosis not present

## 2019-08-26 DIAGNOSIS — E039 Hypothyroidism, unspecified: Secondary | ICD-10-CM | POA: Diagnosis not present

## 2019-08-26 NOTE — Telephone Encounter (Signed)
Patient called checking on the status of her prescriptions of Arava and Zofran.  Patient states she had appointment on Monday, 08/23/19 and was told they would send the refills that day.  Patient states the pharmacist at [facility] doesn't have a record of either prescription being sent.  Please advise

## 2019-08-26 NOTE — Telephone Encounter (Signed)
Left message to advise patient her prescriptions were sent to the pharmacy on 08/23/19.

## 2019-08-27 ENCOUNTER — Other Ambulatory Visit: Payer: Self-pay | Admitting: Rheumatology

## 2019-09-01 DIAGNOSIS — M1611 Unilateral primary osteoarthritis, right hip: Secondary | ICD-10-CM | POA: Diagnosis not present

## 2019-09-01 DIAGNOSIS — M25551 Pain in right hip: Secondary | ICD-10-CM | POA: Diagnosis not present

## 2019-09-03 DIAGNOSIS — Z1231 Encounter for screening mammogram for malignant neoplasm of breast: Secondary | ICD-10-CM | POA: Diagnosis not present

## 2019-09-20 DIAGNOSIS — I10 Essential (primary) hypertension: Secondary | ICD-10-CM | POA: Diagnosis not present

## 2019-09-20 DIAGNOSIS — R519 Headache, unspecified: Secondary | ICD-10-CM | POA: Diagnosis not present

## 2019-10-04 DIAGNOSIS — T464X5A Adverse effect of angiotensin-converting-enzyme inhibitors, initial encounter: Secondary | ICD-10-CM | POA: Diagnosis not present

## 2019-10-04 DIAGNOSIS — I10 Essential (primary) hypertension: Secondary | ICD-10-CM | POA: Diagnosis not present

## 2019-10-05 ENCOUNTER — Ambulatory Visit: Payer: PPO | Admitting: Physician Assistant

## 2019-10-22 ENCOUNTER — Encounter: Payer: Self-pay | Admitting: Cardiology

## 2019-10-22 ENCOUNTER — Other Ambulatory Visit: Payer: Self-pay

## 2019-10-22 ENCOUNTER — Ambulatory Visit: Payer: PPO | Admitting: Cardiology

## 2019-10-22 VITALS — BP 134/80 | HR 64 | Ht 62.5 in | Wt 197.0 lb

## 2019-10-22 DIAGNOSIS — I10 Essential (primary) hypertension: Secondary | ICD-10-CM

## 2019-10-22 DIAGNOSIS — E7800 Pure hypercholesterolemia, unspecified: Secondary | ICD-10-CM | POA: Diagnosis not present

## 2019-10-22 DIAGNOSIS — E785 Hyperlipidemia, unspecified: Secondary | ICD-10-CM | POA: Diagnosis not present

## 2019-10-22 DIAGNOSIS — Z79899 Other long term (current) drug therapy: Secondary | ICD-10-CM

## 2019-10-22 DIAGNOSIS — I341 Nonrheumatic mitral (valve) prolapse: Secondary | ICD-10-CM

## 2019-10-22 DIAGNOSIS — E039 Hypothyroidism, unspecified: Secondary | ICD-10-CM | POA: Diagnosis not present

## 2019-10-22 DIAGNOSIS — M069 Rheumatoid arthritis, unspecified: Secondary | ICD-10-CM | POA: Diagnosis not present

## 2019-10-22 MED ORDER — ROSUVASTATIN CALCIUM 20 MG PO TABS: 20.0000 mg | ORAL_TABLET | Freq: Every day | ORAL | 3 refills | Status: DC

## 2019-10-22 MED ORDER — LOSARTAN POTASSIUM 100 MG PO TABS: 100.0000 mg | ORAL_TABLET | Freq: Every day | ORAL | 3 refills | Status: DC

## 2019-10-22 MED ORDER — SPIRONOLACTONE 25 MG PO TABS: 25.0000 mg | ORAL_TABLET | Freq: Every day | ORAL | 3 refills | Status: DC

## 2019-10-22 NOTE — Progress Notes (Signed)
Cardiology Office Note:    Date:  10/22/2019   ID:  [name], DOB 02/22/1951, MRN [id number]  PCP:  Aretta Nip, MD  Cardiologist:  Candee Furbish, MD  Electrophysiologist:  None   Referring MD: Aretta Nip, MD     History of Present Illness:    Kari [name] is a 69 y.o. female here for follow-up palpitations.  Has been battling with some elevated blood pressures recently.  Headaches.  Worried.  Family history of stroke, MI. See below for details. Prior echocardiogram normal EF.  Last televisit did very well no real issues.  Been taking nadolol.  Past Medical History:  Diagnosis Date  . Arthritis   . Complication of anesthesia    when block attempted ax -turned red in preop-surg delayed  . Fibromyalgia   . GERD (gastroesophageal reflux disease)   . Hypertension   . Hypothyroidism   . MVP (mitral valve prolapse)   . Osteoporosis   . Rheumatoid aortitis    ? arthritis    Past Surgical History:  Procedure Laterality Date  . ABDOMINAL HYSTERECTOMY    . CESAREAN SECTION    . CHOLECYSTECTOMY    . COLONOSCOPY    . JOINT REPLACEMENT    . KNEE ARTHROSCOPY Right 2/11  . TOTAL HIP ARTHROPLASTY Left 02/03/2013   Procedure: TOTAL HIP ARTHROPLASTY;  Surgeon: Kerin Salen, MD;  Location: [place];  Service: Orthopedics;  Laterality: Left;  . ULNAR NERVE TRANSPOSITION  09/17/2011   Procedure: ULNAR NERVE DECOMPRESSION/TRANSPOSITION;  Surgeon: Cammie Sickle., MD;  Location: [place];  Service: Orthopedics;  Laterality: Left;  decompression  . WRIST ARTHROSCOPY Right    x2-fusion    . WRIST ARTHROSCOPY Left     Current Medications: Current Meds  Medication Sig  . Calcium-Vitamin D (CALTRATE 600 PLUS-VIT D PO) Take 1 tablet by mouth 2 (two) times daily.  . cholecalciferol (VITAMIN D) 1000 UNITS tablet Take 1,000 Units by mouth daily.  . cyclobenzaprine (FLEXERIL) 10 MG tablet Take 10 mg by mouth as needed.   . diclofenac Sodium (VOLTAREN) 1  % GEL Apply topically.  Marland Kitchen eletriptan (RELPAX) 20 MG tablet as needed.   [name] SURECLICK 50 MG/ML injection Inject 0.98 mLs (50 mg total) into the skin once a week.  . fluticasone (FLONASE) 50 MCG/ACT nasal spray Place 1 spray into the nose daily.  Marland Kitchen gabapentin (NEURONTIN) 300 MG capsule take 4 capsules by mouth daily  . leflunomide (ARAVA) 10 MG tablet Take 1 tablet (10 mg total) by mouth daily.  Marland Kitchen levothyroxine (SYNTHROID) 75 MCG tablet Take 75 mcg by mouth every morning.  . Multiple Vitamin (MULTIVITAMIN WITH MINERALS) TABS Take 1 tablet by mouth daily.  . nadolol (CORGARD) 40 MG tablet Take 40 mg by mouth every morning.  . Omega-3 Fatty Acids (FISH OIL PO) Take 1 capsule by mouth daily.  . ondansetron (ZOFRAN) 4 MG tablet Take 1 tablet (4 mg total) by mouth every 8 (eight) hours as needed for nausea or vomiting.  . pantoprazole (PROTONIX) 40 MG tablet Take 40 mg by mouth 2 (two) times daily.   Marland Kitchen PROAIR HFA 108 (90 BASE) MCG/ACT inhaler as needed.   . sucralfate (CARAFATE) 1 g tablet Take 1 g by mouth as needed (stomach).   . valACYclovir (VALTREX) 1000 MG tablet as needed.  . [DISCONTINUED] losartan (COZAAR) 50 MG tablet Take 50 mg by mouth daily.  . [DISCONTINUED] simvastatin (ZOCOR) 20 MG tablet Take 20  mg by mouth every evening.  . [DISCONTINUED] spironolactone (ALDACTONE) 25 MG tablet Take 0.5 tablets (12.5 mg total) by mouth daily.     Allergies:   Clinoril [sulindac], Erythromycin, Hydrocodone, Penicillins, Plaquenil [hydroxychloroquine], Sulfa antibiotics, Trimethoprim, and Vancomycin cross reactors   Social History   Socioeconomic History  . Marital status: Widowed    Spouse name: Not on file  . Number of children: Not on file  . Years of education: Not on file  . Highest education level: Not on file  Occupational History  . Not on file  Tobacco Use  . Smoking status: Never Smoker  . Smokeless tobacco: Never Used  Substance and Sexual Activity  . Alcohol use: Yes     Comment: rarely   . Drug use: No  . Sexual activity: Not on file  Other Topics Concern  . Not on file  Social History Narrative  . Not on file   Social Determinants of Health   Financial Resource Strain:   . Difficulty of Paying Living Expenses:   Food Insecurity:   . Worried About Charity fundraiser in the Last Year:   . Arboriculturist in the Last Year:   Transportation Needs:   . Film/video editor (Medical):   Marland Kitchen Lack of Transportation (Non-Medical):   Physical Activity:   . Days of Exercise per Week:   . Minutes of Exercise per Session:   Stress:   . Feeling of Stress :   Social Connections:   . Frequency of Communication with Friends and Family:   . Frequency of Social Gatherings with Friends and Family:   . Attends Religious Services:   . Active Member of Clubs or Organizations:   . Attends Archivist Meetings:   Marland Kitchen Marital Status:      Family History: The patient's family history includes Colon cancer in an other family member; Colon polyps in an other family member; Heart attack in her father; Heart disease in her mother; Hypothyroidism in her son; Stroke in her mother.    EKGs/Labs/Other Studies Reviewed:    The following studies were reviewed today: There is no recent echocardiogram in our system.  EKG:  EKG is  ordered today.  The ekg ordered today demonstrates sinus rhythm left anterior fascicular block nonspecific ST-T wave changes  Recent Labs: 08/17/2019: ALT 22; BUN 15; Creat 0.74; Hemoglobin 13.5; Platelets 225; Potassium 4.1; Sodium 141  Recent Lipid Panel No results found for: CHOL, TRIG, HDL, CHOLHDL, VLDL, LDLCALC, LDLDIRECT  Physical Exam:    VS:  BP 134/80   Pulse 64   Ht 5' 2.5" (1.588 m)   Wt 197 lb (89.4 kg)   SpO2 99%   BMI 35.46 kg/m     Wt Readings from Last 3 Encounters:  10/22/19 197 lb (89.4 kg)  08/23/19 199 lb 6.4 oz (90.4 kg)  02/01/19 190 lb 3.2 oz (86.3 kg)     GEN:  Well nourished, well developed in no  acute distress HEENT: Normal NECK: No JVD; No carotid bruits LYMPHATICS: No lymphadenopathy CARDIAC: RRR, no murmurs, rubs, gallops RESPIRATORY:  Clear to auscultation without rales, wheezing or rhonchi  ABDOMEN: Soft, non-tender, non-distended MUSCULOSKELETAL:  No edema; No deformity  SKIN: Warm and dry NEUROLOGIC:  Alert and oriented x 3 PSYCHIATRIC:  Normal affect   ASSESSMENT:    1. Pure hypercholesterolemia   2. Essential hypertension   3. Medication management   4. Mitral valve prolapse    PLAN:  In order of problems listed above:  Palpitations -Previously what she was describing sound like PVCs. -Interestingly they improved after she started taking spironolactone.  I wonder if her potassium levels were decreasing potentiating PVCs. -Nadolol seems to be doing well as well.  Rheumatoid arthritis -Per rheumatology.  Hypokalemia -Currently stable last potassium outside checking 4.1 creatinine 0.7 hemoglobin 13.5 ALT 22.  On spironolactone.  Essential hypertension -A bit elevated at home 170 at times, 130. Advil-migraine. Lisiopril - cough. Losartan now cough gone. Has stroke FHX. Father MI at her age.  Was worried that she was having a couple headaches.  She does not have any neurologic sequela. -We will go ahead and increase her losartan to 100 mg a day -Increase spironolactone to 25 mg a day -Repeat basic metabolic profile in 2 weeks, hard to get blood, left hand only. -Hypertension clinic here in 2 weeks.  Pharmacy lead.  Hyperlipidemia -LDL 106.  I would like for this to be less than 70 given her aortic atherosclerosis descending.  We will change her over from simvastatin 20 to Crestor 20.  Prolapse -We will check an echocardiogram.  This is a historic diagnosis for her.  I hear a very soft murmur on exam.   Medication Adjustments/Labs and Tests Ordered: Current medicines are reviewed at length with the patient today.  Concerns regarding medicines are  outlined above.  Orders Placed This Encounter  Procedures  . Basic metabolic panel  . AMB REFERRAL TO ADVANCED HTN CLINIC  . EKG 12-Lead  . ECHOCARDIOGRAM COMPLETE   Meds ordered this encounter  Medications  . losartan (COZAAR) 100 MG tablet    Sig: Take 1 tablet (100 mg total) by mouth daily.    Dispense:  90 tablet    Refill:  3  . spironolactone (ALDACTONE) 25 MG tablet    Sig: Take 1 tablet (25 mg total) by mouth daily.    Dispense:  90 tablet    Refill:  3  . rosuvastatin (CRESTOR) 20 MG tablet    Sig: Take 1 tablet (20 mg total) by mouth daily.    Dispense:  90 tablet    Refill:  3    Patient Instructions  Medication Instructions:  Please discontinue your Simvstatin.  Start Crestor 20 mg a day.  Increase Losartan to 100 mg a day and Spironolactone to 25 mg a day.  Continue all other medications as listed.  *If you need a refill on your cardiac medications before your next appointment, please call your pharmacy*  Lab Work: Please have blood work in 2 weeks (BMP)  If you have labs (blood work) drawn today and your tests are completely normal, you will receive your results only by: Marland Kitchen MyChart Message (if you have MyChart) OR . A paper copy in the mail If you have any lab test that is abnormal or we need to change your treatment, we will call you to review the results.  You have been referred to the Hypertension Clinic.  Dr Marlou Porch would like you seen within 2 weeks.  Testing/Procedures: Your physician has requested that you have an echocardiogram. Echocardiography is a painless test that uses sound waves to create images of your heart. It provides your doctor with information about the size and shape of your heart and how well your heart's chambers and valves are working. This procedure takes approximately one hour. There are no restrictions for this procedure.  Follow-Up: At [facility], you and your health needs are our priority.  As  part of our continuing mission to  provide you with exceptional heart care, we have created designated Provider Care Teams.  These Care Teams include your primary Cardiologist (physician) and Advanced Practice Providers (APPs -  Physician Assistants and Nurse Practitioners) who all work together to provide you with the care you need, when you need it.  We recommend signing up for the patient portal called "MyChart".  Sign up information is provided on this After Visit Summary.  MyChart is used to connect with patients for Virtual Visits (Telemedicine).  Patients are able to view lab/test results, encounter notes, upcoming appointments, etc.  Non-urgent messages can be sent to your provider as well.   To learn more about what you can do with MyChart, go to [email or web address].    Your next appointment:   6 month(s)  The format for your next appointment:   In Person  Provider:   Candee Furbish, MD   Thank you for choosing [facility]!!         Signed, Candee Furbish, MD  10/22/2019 10:02 AM    [place]

## 2019-10-22 NOTE — Patient Instructions (Signed)
Medication Instructions:  Please discontinue your Simvstatin.  Start Crestor 20 mg a day.  Increase Losartan to 100 mg a day and Spironolactone to 25 mg a day.  Continue all other medications as listed.  *If you need a refill on your cardiac medications before your next appointment, please call your pharmacy*  Lab Work: Please have blood work in 2 weeks (BMP)  If you have labs (blood work) drawn today and your tests are completely normal, you will receive your results only by: Marland Kitchen MyChart Message (if you have MyChart) OR . A paper copy in the mail If you have any lab test that is abnormal or we need to change your treatment, we will call you to review the results.  You have been referred to the [facility].  Dr Marlou Porch would like you seen within 2 weeks.  Testing/Procedures: Your physician has requested that you have an echocardiogram. Echocardiography is a painless test that uses sound waves to create images of your heart. It provides your doctor with information about the size and shape of your heart and how well your heart's chambers and valves are working. This procedure takes approximately one hour. There are no restrictions for this procedure.  Follow-Up: At [facility], you and your health needs are our priority.  As part of our continuing mission to provide you with exceptional heart care, we have created designated Provider Care Teams.  These Care Teams include your primary Cardiologist (physician) and Advanced Practice Providers (APPs -  Physician Assistants and Nurse Practitioners) who all work together to provide you with the care you need, when you need it.  We recommend signing up for the patient portal called "MyChart".  Sign up information is provided on this After Visit Summary.  MyChart is used to connect with patients for Virtual Visits (Telemedicine).  Patients are able to view lab/test results, encounter notes, upcoming appointments, etc.  Non-urgent messages can be  sent to your provider as well.   To learn more about what you can do with MyChart, go to [email or web address].    Your next appointment:   6 month(s)  The format for your next appointment:   In Person  Provider:   Candee Furbish, MD   Thank you for choosing [facility]!!

## 2019-10-27 DIAGNOSIS — M25551 Pain in right hip: Secondary | ICD-10-CM | POA: Diagnosis not present

## 2019-10-27 DIAGNOSIS — M545 Low back pain: Secondary | ICD-10-CM | POA: Diagnosis not present

## 2019-11-03 NOTE — Progress Notes (Signed)
Office Visit Note  Patient: Kari [name]             Date of Birth: December 17, 1950           MRN: [id number]             PCP: Aretta Nip, MD Referring: Aretta Nip, MD Visit Date: 11/10/2019 Occupation: @GUAROCC @  Subjective:  Right hip pain   History of Present Illness: Kari [name] is a 69 y.o. female with history of seropositive rheumatoid arthritis and myofascial pain syndrome.  Patient is on Enbrel 50 mg subcutaneous injections once weekly and Arava 10 mg 1 tablet by mouth daily.  She has been tolerating Arava without any side effects.  Patient reports that she has been experiencing injection site reactions after each Enbrel injection over the past several months.  She states that she takes a daytime antihistamine on a daily basis and has tried several topical agents but has not noticed any improvement.  She does not want to make any medication changes at this time since she feels Enbrel and Arava have been a good combination for controlling her rheumatoid arthritis.  She denies any recent rheumatoid arthritis flares.  She has been experiencing increased right hip pain for the past several months.  She receives cortisone injections every 3 months but her discomfort has been progressing.  She had an MRI on Saturday and will be following up with Dr. Mardelle Matte to discuss MRI results.  She states that her left hip replacement is doing well overall.   Activities of Daily Living:  Patient reports morning stiffness for 5 minutes.   Patient Reports nocturnal pain.  Difficulty dressing/grooming: Denies Difficulty climbing stairs: Reports Difficulty getting out of chair: Denies Difficulty using hands for taps, buttons, cutlery, and/or writing: Reports  Review of Systems  Constitutional: Negative for fatigue.  HENT: Negative for mouth sores, mouth dryness and nose dryness.   Eyes: Negative for pain, itching, visual disturbance and dryness.  Respiratory: Negative for cough,  hemoptysis, shortness of breath and difficulty breathing.   Cardiovascular: Negative for chest pain, palpitations, hypertension and swelling in legs/feet.  Gastrointestinal: Negative for blood in stool, constipation and diarrhea.  Endocrine: Negative for increased urination.  Genitourinary: Negative for difficulty urinating and painful urination.  Musculoskeletal: Positive for arthralgias, joint pain, myalgias, morning stiffness, muscle tenderness and myalgias. Negative for joint swelling and muscle weakness.  Skin: Positive for rash and redness. Negative for color change, pallor, hair loss, nodules/bumps, skin tightness, ulcers and sensitivity to sunlight.  Allergic/Immunologic: Negative for susceptible to infections.  Neurological: Negative for dizziness, numbness, headaches, memory loss and weakness.  Hematological: Positive for bruising/bleeding tendency. Negative for swollen glands.  Psychiatric/Behavioral: Positive for sleep disturbance. Negative for depressed mood and confusion. The patient is not nervous/anxious.     PMFS History:  Patient Active Problem List   Diagnosis Date Noted  . Hyperlipidemia 07/14/2015  . Palpitation 05/11/2014  . PVC (premature ventricular contraction) 05/11/2014  . Hypokalemia 05/11/2014  . Essential hypertension 05/11/2014  . Mitral regurgitation 05/11/2014  . Arthritis, hip 02/03/2013    Past Medical History:  Diagnosis Date  . Arthritis   . Complication of anesthesia    when block attempted ax -turned red in preop-surg delayed  . Fibromyalgia   . GERD (gastroesophageal reflux disease)   . Hypertension   . Hypothyroidism   . MVP (mitral valve prolapse)   . Osteoporosis   . 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 Heart attack Father   . Heart disease Mother   . Stroke Mother   . Colon cancer Other   . Colon polyps Other   . Hypothyroidism Son    Past Surgical History:  Procedure  Laterality Date  . ABDOMINAL HYSTERECTOMY    . CESAREAN SECTION    . CHOLECYSTECTOMY    . COLONOSCOPY    . JOINT REPLACEMENT    . KNEE ARTHROSCOPY Right 2/11  . TOTAL HIP ARTHROPLASTY Left 02/03/2013   Procedure: TOTAL HIP ARTHROPLASTY;  Surgeon: Kerin Salen, MD;  Location: [place];  Service: Orthopedics;  Laterality: Left;  . ULNAR NERVE TRANSPOSITION  09/17/2011   Procedure: ULNAR NERVE DECOMPRESSION/TRANSPOSITION;  Surgeon: Cammie Sickle., MD;  Location: [place];  Service: Orthopedics;  Laterality: Left;  decompression  . WRIST ARTHROSCOPY Right    x2-fusion    . WRIST ARTHROSCOPY Left    Social History   Social History Narrative  . Not on file    There is no immunization history on file for this patient.   Objective: Vital Signs: BP (!) 147/76 (BP Location: Left Arm, Patient Position: Sitting, Cuff Size: Normal)   Pulse 69   Resp 15   Ht 5\' 3"  (1.6 m)   Wt 199 lb 9.6 oz (90.5 kg)   BMI 35.36 kg/m    Physical Exam Vitals and nursing note reviewed.  Constitutional:      Appearance: She is well-developed.  HENT:     Head: Normocephalic and atraumatic.  Eyes:     Conjunctiva/sclera: Conjunctivae normal.  Pulmonary:     Effort: Pulmonary effort is normal.  Abdominal:     General: Bowel sounds are normal.     Palpations: Abdomen is soft.  Musculoskeletal:     Cervical back: Normal range of motion.  Lymphadenopathy:     Cervical: No cervical adenopathy.  Skin:    General: Skin is warm and dry.     Capillary Refill: Capillary refill takes less than 2 seconds.  Neurological:     Mental Status: She is alert and oriented to person, place, and time.  Psychiatric:        Behavior: Behavior normal.      Musculoskeletal Exam: C-spine good range of motion.  Postural thoracic kyphosis noted.  Shoulder joints and elbow joints have good range of motion with no discomfort.  Right wrist fused.  Limited range of motion of left wrist joint.  MCPs, PIPs,  DIPs have good range of motion with no synovitis.  Left hip replacement has good range of motion with no discomfort.  She has slightly limited range of motion of the right hip with discomfort.  Knee joints have good range of motion with no warmth or effusion.  Ankle joints have good range of motion with no tenderness or inflammation.  CDAI Exam: CDAI Score: 0.9  Patient Global: 5 mm; Provider Global: 4 mm Swollen: 0 ; Tender: 0  Joint Exam 11/10/2019   No joint exam has been documented for this visit   There is currently no information documented on the homunculus. Go to the Rheumatology activity and complete the homunculus joint exam.  Investigation: No additional findings.  Imaging: ECHOCARDIOGRAM COMPLETE  Result Date: 11/08/2019    ECHOCARDIOGRAM REPORT   Patient Name:   Kari [name] Date of Exam: 11/08/2019 Medical Rec #:  [id number]     Height:       7.5  in Accession #:    [id number]    Weight:       197.0 lb Date of Birth:  25-Apr-1951     BSA:          1.911 m Patient Age:    51 years      BP:           134/80 mmHg Patient Gender: F             HR:           63 bpm. Exam Location:  [place] Procedure: 2D Echo, Cardiac Doppler, Color Doppler and Intracardiac            Opacification Agent Indications:    I05.9 Mitral valve disorder  History:        Patient has no prior history of Echocardiogram examinations.                 Mitral Valve Prolapse; Risk Factors:Hypertension and                 Dyslipidemia. Fibromyalgia. Hypothyroidism.  Sonographer:    Diamond Nickel RCS Referring Phys: [phone] [place]  1. Left ventricular ejection fraction, by estimation, is 60 to 65%. The left ventricle has normal function. The left ventricle has no regional wall motion abnormalities. There is moderate left ventricular hypertrophy of the basal-septal segment. Left ventricular diastolic parameters are consistent with Grade I diastolic dysfunction (impaired relaxation). Elevated left  ventricular end-diastolic pressure.  2. Right ventricular systolic function is normal. The right ventricular size is normal.  3. No evidence of mitral valve prolapse. The mitral valve is normal in structure. No evidence of mitral valve regurgitation. No evidence of mitral stenosis.  4. The aortic valve is tricuspid. Aortic valve regurgitation is not visualized. Mild aortic valve sclerosis is present, with no evidence of aortic valve stenosis.  5. The inferior vena cava is normal in size with greater than 50% respiratory variability, suggesting right atrial pressure of 3 mmHg. FINDINGS  Left Ventricle: Left ventricular ejection fraction, by estimation, is 60 to 65%. The left ventricle has normal function. The left ventricle has no regional wall motion abnormalities. The left ventricular internal cavity size was normal in size. There is  moderate left ventricular hypertrophy of the basal-septal segment. Left ventricular diastolic parameters are consistent with Grade I diastolic dysfunction (impaired relaxation). Elevated left ventricular end-diastolic pressure. Right Ventricle: The right ventricular size is normal. No increase in right ventricular wall thickness. Right ventricular systolic function is normal. Left Atrium: Left atrial size was normal in size. Right Atrium: Right atrial size was normal in size. Pericardium: There is no evidence of pericardial effusion. Mitral Valve: No evidence of mitral valve prolapse. The mitral valve is normal in structure. Normal mobility of the mitral valve leaflets. Mild mitral annular calcification. No evidence of mitral valve regurgitation. No evidence of mitral valve stenosis. Tricuspid Valve: The tricuspid valve is normal in structure. Tricuspid valve regurgitation is trivial. No evidence of tricuspid stenosis. Aortic Valve: The aortic valve is tricuspid. Aortic valve regurgitation is not visualized. Mild aortic valve sclerosis is present, with no evidence of aortic valve  stenosis. Pulmonic Valve: The pulmonic valve was normal in structure. Pulmonic valve regurgitation is mild. No evidence of pulmonic stenosis. Aorta: The aortic root is normal in size and structure. Venous: The inferior vena cava is normal in size with greater than 50% respiratory variability, suggesting right atrial pressure of 3 mmHg. IAS/Shunts: The interatrial septum  appears to be lipomatous. No atrial level shunt detected by color flow Doppler.  LEFT VENTRICLE PLAX 2D LVIDd:         2.80 cm  Diastology LVIDs:         1.80 cm  LV e' lateral:   5.98 cm/s LV PW:         1.50 cm  LV E/e' lateral: 14.8 LV IVS:        1.30 cm  LV e' medial:    6.20 cm/s LVOT diam:     1.75 cm  LV E/e' medial:  14.3 LV SV:         57 LV SV Index:   30 LVOT Area:     2.41 cm  RIGHT VENTRICLE RV S prime:     10.60 cm/s LEFT ATRIUM             Index LA diam:        3.40 cm 1.78 cm/m LA Vol (A2C):   44.6 ml 23.34 ml/m LA Vol (A4C):   35.1 ml 18.37 ml/m LA Biplane Vol: 40.9 ml 21.40 ml/m  AORTIC VALVE LVOT Vmax:   85.50 cm/s LVOT Vmean:  60.100 cm/s LVOT VTI:    0.238 m  AORTA Ao Root diam: 3.10 cm MITRAL VALVE MV Area (PHT): 3.56 cm    SHUNTS MV Decel Time: 213 msec    Systemic VTI:  0.24 m MV E velocity: 88.70 cm/s  Systemic Diam: 1.75 cm MV A velocity: 98.10 cm/s MV E/A ratio:  0.90 Fransico Him MD Electronically signed by Fransico Him MD Signature Date/Time: 11/08/2019/6:12:05 PM    Final     Recent Labs: Lab Results  Component Value Date   WBC 5.3 08/17/2019   HGB 13.5 08/17/2019   PLT 225 08/17/2019   NA 142 11/08/2019   K 4.2 11/08/2019   CL 106 11/08/2019   CO2 23 11/08/2019   GLUCOSE 107 (H) 11/08/2019   BUN 12 11/08/2019   CREATININE 0.88 11/08/2019   BILITOT 1.0 08/17/2019   ALKPHOS 72 01/20/2017   AST 22 08/17/2019   ALT 22 08/17/2019   PROT 6.3 08/17/2019   ALBUMIN 4.2 01/20/2017   CALCIUM 9.2 11/08/2019   GFRAA 77 11/08/2019   QFTBGOLDPLUS NEGATIVE 11/20/2018    Speciality Comments: Recieves  Enbrel through [employer] Program  Procedures:  No procedures performed Allergies: Humira [adalimumab], Clinoril [sulindac], Erythromycin, Hydrocodone, Ibuprofen, Penicillins, Plaquenil [hydroxychloroquine], Sulfa antibiotics, Trimethoprim, and Vancomycin cross reactors   Assessment / Plan:     Visit Diagnoses: Rheumatoid arthritis with rheumatoid factor of multiple sites without organ or systems involvement (HCC) - +RF, +ANA, erosive disease: She has no synovitis or tenderness on exam.  She has not had any recent rheumatoid arthritis flares.  She is clinically doing well on Arava 50 mg subcutaneous injections every 7 days and Arava 10 mg 1 tablet by mouth daily.  She has found this combination of medications to be effective at controlling her rheumatoid arthritis.  She has been tolerating Arava without any side effects.  She has been experiencing injection site reactions after each Enbrel injection over the past several months.  She has tried several topical agents including hydrocortisone as well as Benadryl cream topically without any relief.  She is taking an OTC daytime antihistamine but has not noticed any improvement.  We discussed trying a different daytime antihistamine or taking Benadryl on the day of her injections.  We also discussed trying to leave Enbrel out for a  longer period of time prior to injecting.  She does not want to make any medication changes at this time.  She will continue on Enbrel 50 mg subcutaneous injections once weekly and Arava 10 mg 1 tablet by mouth daily.  She does not need any refills at this time.  She was advised to notify us if she develops increased joint pain or joint swelling.  She will follow-up in the office in 5 months.  High risk medication use - Arava 10 mg 1 tablet daily, Enbrel SureClick 50 mg every 7 days, previously on MTX 0.6 ml sq inj q7d-D/c due to nausea.  BMP was drawn on 11/08/2019.  She is due to update CBC and LFTs today.  TB gold was  negative on 11/20/2018.  Order for TB gold was released today.- Plan: CBC with Differential/Platelet, QuantiFERON-TB Gold Plus, AST, ALT  History of total left hip replacement: Performed by Dr. Mayer Camel in 2014.  She has good range of motion with no discomfort on exam.  She has occasional discomfort in the left hip but overall her left hip replacement is doing well.  Pain in right hip: She has chronic right hip joint pain and receives cortisone injections every 3 months.  Her discomfort has been persistent and worsening recently.  She had an MRI performed on Saturday and has an upcoming appointment with Dr. Mardelle Matte to discuss results.  She has slightly limited range of motion on exam today with discomfort.  She has been experiencing intermittent groin pain and difficulty with certain activities due to the discomfort.  If she has to proceed with a right hip replacement she plans on scheduling an appointment with Dr. Mayer Camel to further discuss surgery.   DDD (degenerative disc disease), lumbar: She is been experiencing increased lower back pain which she attributes to gait changes while favoring her right hip.  Myofascial pain syndrome: She experiences intermittent myalgias and muscle tenderness.  She has chronic fatigue secondary to insomnia.  She continues to take gabapentin and Flexeril as prescribed.  Osteopenia of multiple sites: DEXA 03/03/2018 right femoral neck BMD 0.709 with T score -1.3.  She is taking a calcium and vitamin D supplement.  She has not had any recent falls or fractures.  Frequent falls: She has not had any recent falls.  Other medical conditions are listed as follows:   History of hyperlipidemia  History of hypothyroidism  History of hypertension  History of neuropathy  Orders: Orders Placed This Encounter  Procedures  . CBC with Differential/Platelet  . QuantiFERON-TB Gold Plus  . AST  . ALT   No orders of the defined types were placed in this  encounter.    Follow-Up Instructions: Return in about 5 months (around 04/11/2020) for Rheumatoid arthritis.   Ofilia Neas, PA-C  Note - This record has been created using Dragon software.  Chart creation errors have been sought, but may not always  have been located. Such creation errors do not reflect on  the standard of medical care.

## 2019-11-04 ENCOUNTER — Ambulatory Visit: Payer: PPO | Admitting: Physician Assistant

## 2019-11-06 DIAGNOSIS — M25551 Pain in right hip: Secondary | ICD-10-CM | POA: Diagnosis not present

## 2019-11-08 ENCOUNTER — Ambulatory Visit (HOSPITAL_COMMUNITY): Payer: PPO | Attending: Cardiology

## 2019-11-08 ENCOUNTER — Ambulatory Visit (INDEPENDENT_AMBULATORY_CARE_PROVIDER_SITE_OTHER): Payer: PPO | Admitting: Pharmacist

## 2019-11-08 ENCOUNTER — Other Ambulatory Visit: Payer: PPO | Admitting: *Deleted

## 2019-11-08 ENCOUNTER — Other Ambulatory Visit: Payer: Self-pay

## 2019-11-08 VITALS — BP 152/94 | HR 62

## 2019-11-08 DIAGNOSIS — Z79899 Other long term (current) drug therapy: Secondary | ICD-10-CM | POA: Diagnosis not present

## 2019-11-08 DIAGNOSIS — I341 Nonrheumatic mitral (valve) prolapse: Secondary | ICD-10-CM | POA: Diagnosis not present

## 2019-11-08 DIAGNOSIS — I10 Essential (primary) hypertension: Secondary | ICD-10-CM

## 2019-11-08 MED ORDER — PERFLUTREN LIPID MICROSPHERE
1.0000 mL | INTRAVENOUS | Status: AC | PRN
  Administered 2019-11-08: 2 mL via INTRAVENOUS

## 2019-11-08 NOTE — Patient Instructions (Addendum)
It was nice meeting you today!  Your blood pressure goal is <130/80.  Continue losartan 100 mg daily and spironolactone 25 mg daily.  Limit your salt intake  Increase exercise to 30 minutes a day at least 5 days a week  Take your blood pressure ~2 hours after taking your medications  I will call you tomorrow after your lab work comes back   [place]. [address] [place], [place] [address] Phone: [phone]

## 2019-11-08 NOTE — Progress Notes (Signed)
Patient ID: Kari [name]                 DOB: 04/08/1951                      MRN: [id number]     HPI: Kari [name] is a 69 y.o. female referred by Dr. Marlou Porch to HTN clinic. PMH is significant for HLD, HTN, RA, and mitral valve prolapse.    Seen by Dr. Marlou Porch on 10/22/19 for palpitations and elevated BP at home with systolic readings ranging from 130s-170s.  Dr Marlou Porch increased losartan from 50 mg to 100 mg and increased spironolactone to 25 mg daily.  Placed BMP and EKG order but patient has not completed yet.  Of note, due to elevated LDL, simvastatin 20 mg was switched to rosvastatin 20 mg.  Patient presents today for first visit to HTN clinic.  Tolerating losartan 100mg  and spironolactone 25 mg well.  Had BMP bloodwork today as well as an EKG.  Lab had difficulty drawing blood and had to stick multiple times.  Reports she has not been as diligent with checking her blood pressure as she should. Lives with son who helps manage her health since her husband passed away.  Reports RA pain and has appointment on Wednesday with rheumatologist.  Tried CBD gummies but reported they were not effective.  Drinks caffiene free coke and coke zero throughout the day.  Current HTN meds: losartan 100 mg daily, spironolactone 25 mg daily, nadolol 40 mg daily Previously tried: losartan 50 mg daily, atenolol 50 mg daily, metoprolol succinate 50 mg daily, HCTZ 25 mg daily, lisinopril (unknown strength) BP goal: <130/80  Family History: Father: MI, Mother: stroke, heart disease  Social History: drinks alcohol rarely, no tobacco   Diet: tries to avoid fried foods, avoids sweets, 30 ounces of caffeine free coke a day, out to eat once a week  Breakfast: english muffin with butter and grape jelly with coke or milk  Skips lunch  Dinner: grilled chicken, steak, hamburger + potatoes, waffles, eggs.  Honey baked ham, pot roast, drinks coke.  No snacks throughout day   Exercise: Active around house.  Has treadmill  but does not use.   Home BP readings: Reports home BP readings before medication changes were 190/90, 170/100 150/70.  Since increasing losartan, they are now ~130/80.  Did not bring cuff or log.  Takes manual blood pressure at home.  Wt Readings from Last 3 Encounters:  10/22/19 197 lb (89.4 kg)  08/23/19 199 lb 6.4 oz (90.4 kg)  02/01/19 190 lb 3.2 oz (86.3 kg)   BP Readings from Last 3 Encounters:  10/22/19 134/80  08/23/19 (!) 161/76  02/01/19 (!) 144/64   Pulse Readings from Last 3 Encounters:  10/22/19 64  08/23/19 (!) 59  02/01/19 (!) 52    Renal function: CrCl cannot be calculated (Patient's most recent lab result is older than the maximum 21 days allowed.).  Past Medical History:  Diagnosis Date  . Arthritis   . Complication of anesthesia    when block attempted ax -turned red in preop-surg delayed  . Fibromyalgia   . GERD (gastroesophageal reflux disease)   . Hypertension   . Hypothyroidism   . MVP (mitral valve prolapse)   . Osteoporosis   . Rheumatoid aortitis    ? arthritis    Current Outpatient Medications on File Prior to Visit  Medication Sig Dispense Refill  . Calcium-Vitamin D (CALTRATE 600 PLUS-VIT D  PO) Take 1 tablet by mouth 2 (two) times daily.    . cholecalciferol (VITAMIN D) 1000 UNITS tablet Take 1,000 Units by mouth daily.    . cyclobenzaprine (FLEXERIL) 10 MG tablet Take 10 mg by mouth as needed.     . diclofenac Sodium (VOLTAREN) 1 % GEL Apply topically.    Marland Kitchen eletriptan (RELPAX) 20 MG tablet as needed.     [name] SURECLICK 50 MG/ML injection Inject 0.98 mLs (50 mg total) into the skin once a week. 12 mL 0  . fluticasone (FLONASE) 50 MCG/ACT nasal spray Place 1 spray into the nose daily.    Marland Kitchen gabapentin (NEURONTIN) 300 MG capsule take 4 capsules by mouth daily 360 capsule 0  . leflunomide (ARAVA) 10 MG tablet Take 1 tablet (10 mg total) by mouth daily. 90 tablet 0  . levothyroxine (SYNTHROID) 75 MCG tablet Take 75 mcg by mouth every  morning.    Marland Kitchen losartan (COZAAR) 100 MG tablet Take 1 tablet (100 mg total) by mouth daily. 90 tablet 3  . Multiple Vitamin (MULTIVITAMIN WITH MINERALS) TABS Take 1 tablet by mouth daily.    . nadolol (CORGARD) 40 MG tablet Take 40 mg by mouth every morning.    . Omega-3 Fatty Acids (FISH OIL PO) Take 1 capsule by mouth daily.    . ondansetron (ZOFRAN) 4 MG tablet Take 1 tablet (4 mg total) by mouth every 8 (eight) hours as needed for nausea or vomiting. 20 tablet 0  . pantoprazole (PROTONIX) 40 MG tablet Take 40 mg by mouth 2 (two) times daily.     Marland Kitchen PROAIR HFA 108 (90 BASE) MCG/ACT inhaler as needed.   0  . rosuvastatin (CRESTOR) 20 MG tablet Take 1 tablet (20 mg total) by mouth daily. 90 tablet 3  . spironolactone (ALDACTONE) 25 MG tablet Take 1 tablet (25 mg total) by mouth daily. 90 tablet 3  . sucralfate (CARAFATE) 1 g tablet Take 1 g by mouth as needed (stomach).     . valACYclovir (VALTREX) 1000 MG tablet as needed.     No current facility-administered medications on file prior to visit.    Allergies  Allergen Reactions  . Clinoril [Sulindac] Nausea And Vomiting  . Erythromycin Nausea And Vomiting  . Hydrocodone Nausea Only    "in high doses stomach upset" ok with low doses.   . Penicillins Hives  . Plaquenil [Hydroxychloroquine]   . Sulfa Antibiotics Nausea And Vomiting  . Trimethoprim Nausea And Vomiting  . Vancomycin Cross Reactors     Flushing-redmans reaction     Assessment/Plan:  1. Hypertension - Patients BP today 152/94 which is above goal of <130/90, however patient had been stuck multiple times in lab and had EKG.  Recommended patient take home BP tomorrow morning 2 hours after taking medications after sitting relaxed for 5 minutes.  Recommended patient decrease the amount of coke she is drinking as sugar and caffeine can increase her BP and HR.  Recommend she limit salt intake and increase exercise to 30 minutes a day at least 5 days a week.  Bloodwork results  pending.  Will call patient tomorrow after results come back and patient has home reading.  Karren Cobble, PharmD, BCACP, [place] [id number] [name] [address], [name], [place] [address] Phone: [phone]; Fax: [phone] 11/08/2019 4:46 PM

## 2019-11-09 ENCOUNTER — Telehealth: Payer: Self-pay | Admitting: Pharmacist

## 2019-11-09 LAB — BASIC METABOLIC PANEL
BUN/Creatinine Ratio: 14 (ref 12–28)
BUN: 12 mg/dL (ref 8–27)
CO2: 23 mmol/L (ref 20–29)
Calcium: 9.2 mg/dL (ref 8.7–10.3)
Chloride: 106 mmol/L (ref 96–106)
Creatinine, Ser: 0.88 mg/dL (ref 0.57–1.00)
GFR calc Af Amer: 77 mL/min/{1.73_m2} (ref >59)
GFR calc non Af Amer: 67 mL/min/{1.73_m2} (ref >59)
Glucose: 107 mg/dL — ABNORMAL HIGH (ref 65–99)
Potassium: 4.2 mmol/L (ref 3.5–5.2)
Sodium: 142 mmol/L (ref 134–144)

## 2019-11-09 NOTE — Telephone Encounter (Signed)
Spoke with patient over the phone.  Patient reported her home BP this morning was 130/80.  No changes needed to medications at this time.

## 2019-11-10 ENCOUNTER — Encounter: Payer: Self-pay | Admitting: Physician Assistant

## 2019-11-10 ENCOUNTER — Telehealth: Payer: Self-pay | Admitting: Cardiology

## 2019-11-10 ENCOUNTER — Other Ambulatory Visit: Payer: Self-pay

## 2019-11-10 ENCOUNTER — Telehealth: Payer: Self-pay

## 2019-11-10 ENCOUNTER — Ambulatory Visit: Payer: PPO | Admitting: Physician Assistant

## 2019-11-10 VITALS — BP 147/76 | HR 69 | Resp 15 | Ht 63.0 in | Wt 199.6 lb

## 2019-11-10 DIAGNOSIS — M7918 Myalgia, other site: Secondary | ICD-10-CM

## 2019-11-10 DIAGNOSIS — Z8679 Personal history of other diseases of the circulatory system: Secondary | ICD-10-CM | POA: Diagnosis not present

## 2019-11-10 DIAGNOSIS — Z8639 Personal history of other endocrine, nutritional and metabolic disease: Secondary | ICD-10-CM

## 2019-11-10 DIAGNOSIS — Z96642 Presence of left artificial hip joint: Secondary | ICD-10-CM

## 2019-11-10 DIAGNOSIS — M8589 Other specified disorders of bone density and structure, multiple sites: Secondary | ICD-10-CM | POA: Diagnosis not present

## 2019-11-10 DIAGNOSIS — Z8669 Personal history of other diseases of the nervous system and sense organs: Secondary | ICD-10-CM | POA: Diagnosis not present

## 2019-11-10 DIAGNOSIS — Z79899 Other long term (current) drug therapy: Secondary | ICD-10-CM | POA: Diagnosis not present

## 2019-11-10 DIAGNOSIS — M25551 Pain in right hip: Secondary | ICD-10-CM

## 2019-11-10 DIAGNOSIS — M0579 Rheumatoid arthritis with rheumatoid factor of multiple sites without organ or systems involvement: Secondary | ICD-10-CM

## 2019-11-10 DIAGNOSIS — M5136 Other intervertebral disc degeneration, lumbar region: Secondary | ICD-10-CM | POA: Diagnosis not present

## 2019-11-10 DIAGNOSIS — R296 Repeated falls: Secondary | ICD-10-CM

## 2019-11-10 NOTE — Telephone Encounter (Signed)
The patient has been notified of the lab result and verbalized understanding.  All questions (if any) were answered. Frederik Schmidt, RN 11/10/2019 2:17 PM

## 2019-11-10 NOTE — Telephone Encounter (Signed)
° °  Went to chart to check who called pt. Transferred call to [employer]

## 2019-11-10 NOTE — Telephone Encounter (Signed)
-----   Message from Jerline Pain, MD sent at 11/10/2019  2:08 PM EDT ----- Labs stable with increased spironolactone Candee Furbish, MD

## 2019-11-11 NOTE — Progress Notes (Signed)
CBC WNL.  LFTs WNL.

## 2019-11-12 LAB — CBC WITH DIFFERENTIAL/PLATELET
Absolute Monocytes: 673 cells/uL (ref 200–950)
Basophils Absolute: 71 cells/uL (ref 0–200)
Basophils Relative: 1.4 %
Eosinophils Absolute: 219 cells/uL (ref 15–500)
Eosinophils Relative: 4.3 %
HCT: 37.5 % (ref 35.0–45.0)
Hemoglobin: 12.5 g/dL (ref 11.7–15.5)
Lymphs Abs: 1744 cells/uL (ref 850–3900)
MCH: 32.9 pg (ref 27.0–33.0)
MCHC: 33.3 g/dL (ref 32.0–36.0)
MCV: 98.7 fL (ref 80.0–100.0)
MPV: 10.1 fL (ref 7.5–12.5)
Monocytes Relative: 13.2 %
Neutro Abs: 2392 cells/uL (ref 1500–7800)
Neutrophils Relative %: 46.9 %
Platelets: 212 10*3/uL (ref 140–400)
RBC: 3.8 10*6/uL (ref 3.80–5.10)
RDW: 12.3 % (ref 11.0–15.0)
Total Lymphocyte: 34.2 %
WBC: 5.1 10*3/uL (ref 3.8–10.8)

## 2019-11-12 LAB — AST: AST: 28 U/L (ref 10–35)

## 2019-11-12 LAB — QUANTIFERON-TB GOLD PLUS
Mitogen-NIL: 7.71 IU/mL
NIL: 0.06 IU/mL
QuantiFERON-TB Gold Plus: NEGATIVE
TB1-NIL: <0 IU/mL
TB2-NIL: 0.01 IU/mL

## 2019-11-12 LAB — ALT: ALT: 29 U/L (ref 6–29)

## 2019-11-15 NOTE — Progress Notes (Signed)
TB gold negative

## 2019-11-16 ENCOUNTER — Other Ambulatory Visit: Payer: Self-pay | Admitting: Rheumatology

## 2019-11-16 ENCOUNTER — Other Ambulatory Visit: Payer: Self-pay | Admitting: Physician Assistant

## 2019-11-16 DIAGNOSIS — M0579 Rheumatoid arthritis with rheumatoid factor of multiple sites without organ or systems involvement: Secondary | ICD-10-CM

## 2019-11-16 NOTE — Telephone Encounter (Signed)
Last Visit: 11/10/2019 Next Visit: 04/18/2020  Okay to refill per Dr. Estanislado Pandy.

## 2019-11-16 NOTE — Telephone Encounter (Signed)
Last Visit: 11/10/2019 Next Visit: 04/18/2020 Labs: 11/08/2019 BMP: glucose 107 11/10/2019 CBC, AST, ALT: CBC WNL. LFTs WNL.   Okay to refill per Dr. Estanislado Pandy.

## 2019-11-18 DIAGNOSIS — M069 Rheumatoid arthritis, unspecified: Secondary | ICD-10-CM | POA: Diagnosis not present

## 2019-11-18 DIAGNOSIS — E039 Hypothyroidism, unspecified: Secondary | ICD-10-CM | POA: Diagnosis not present

## 2019-11-18 DIAGNOSIS — E785 Hyperlipidemia, unspecified: Secondary | ICD-10-CM | POA: Diagnosis not present

## 2019-11-18 DIAGNOSIS — I10 Essential (primary) hypertension: Secondary | ICD-10-CM | POA: Diagnosis not present

## 2019-11-18 DIAGNOSIS — E7800 Pure hypercholesterolemia, unspecified: Secondary | ICD-10-CM | POA: Diagnosis not present

## 2019-12-03 DIAGNOSIS — M25551 Pain in right hip: Secondary | ICD-10-CM | POA: Diagnosis not present

## 2019-12-22 DIAGNOSIS — E039 Hypothyroidism, unspecified: Secondary | ICD-10-CM | POA: Diagnosis not present

## 2019-12-22 DIAGNOSIS — R202 Paresthesia of skin: Secondary | ICD-10-CM | POA: Diagnosis not present

## 2019-12-28 DIAGNOSIS — H40013 Open angle with borderline findings, low risk, bilateral: Secondary | ICD-10-CM | POA: Diagnosis not present

## 2019-12-28 DIAGNOSIS — H16223 Keratoconjunctivitis sicca, not specified as Sjogren's, bilateral: Secondary | ICD-10-CM | POA: Diagnosis not present

## 2019-12-28 DIAGNOSIS — Z961 Presence of intraocular lens: Secondary | ICD-10-CM | POA: Diagnosis not present

## 2019-12-28 DIAGNOSIS — H35383 Toxic maculopathy, bilateral: Secondary | ICD-10-CM | POA: Diagnosis not present

## 2019-12-30 DIAGNOSIS — R202 Paresthesia of skin: Secondary | ICD-10-CM | POA: Diagnosis not present

## 2020-01-05 ENCOUNTER — Telehealth: Payer: Self-pay | Admitting: Rheumatology

## 2020-01-05 MED ORDER — ENBREL SURECLICK 50 MG/ML ~~LOC~~ SOAJ: 50.0000 mg | SUBCUTANEOUS | 0 refills | Status: DC

## 2020-01-05 NOTE — Telephone Encounter (Signed)
Last Visit: 11/10/2019 Next Visit: 04/18/2020 Labs: 11/08/2019 BMP: glucose 107 11/10/2019 CBC, AST, ALT: CBC WNL. LFTs WNL.  TB Gold: 11/10/2019 Neg   Current Dose per office note on 0/28/9022: Enbrel SureClick 50 mg every 7 days  Okay to refill Enbrel?

## 2020-01-05 NOTE — Telephone Encounter (Signed)
Patient requests refill on Enbrel sent to [facility].

## 2020-01-12 DIAGNOSIS — E039 Hypothyroidism, unspecified: Secondary | ICD-10-CM | POA: Diagnosis not present

## 2020-01-12 DIAGNOSIS — I10 Essential (primary) hypertension: Secondary | ICD-10-CM | POA: Diagnosis not present

## 2020-01-12 DIAGNOSIS — E7800 Pure hypercholesterolemia, unspecified: Secondary | ICD-10-CM | POA: Diagnosis not present

## 2020-01-12 DIAGNOSIS — E785 Hyperlipidemia, unspecified: Secondary | ICD-10-CM | POA: Diagnosis not present

## 2020-01-12 DIAGNOSIS — M069 Rheumatoid arthritis, unspecified: Secondary | ICD-10-CM | POA: Diagnosis not present

## 2020-01-18 DIAGNOSIS — B028 Zoster with other complications: Secondary | ICD-10-CM | POA: Diagnosis not present

## 2020-01-31 ENCOUNTER — Telehealth: Payer: Self-pay | Admitting: Rheumatology

## 2020-01-31 NOTE — Telephone Encounter (Signed)
Patient experiencing numbness/ tingling in both hands/ fingertips. This is not constant, but will last for a week or two once it starts. Fingers feel like they are asleep. NKI to cspine. Onset 3 months at least. Patient has seen PCP ( with normal bloodwork) whom directed patient here. Please call to advise.

## 2020-01-31 NOTE — Telephone Encounter (Signed)
Patient states she is having tingling in both hands. Patient states she has seen her PCP and states she pricked her fingers with a needle and she was able to feel that. Patient states this has been going off and on for 3 months. Patient states it feels like her "hands are asleep." Patient she is also having elbow pain in both elbows. Patient states she had elbow surgery several ago.  Patient would like to know who she should see. Please advise.

## 2020-01-31 NOTE — Progress Notes (Signed)
Office Visit Note  Patient: Kari [name]             Date of Birth: [birth year]           MRN: [id number]             PCP: Aretta Nip, MD Referring: Aretta Nip, MD Visit Date: 02/14/2020 Occupation: @GUAROCC @  Subjective:  Paresthesia of both hands   History of Present Illness: Kari [name] is a 69 y.o. female with history of seropositive rheumatoid arthritis and DDD.  Patient is on Arava 10 mg 1 tablet by mouth daily and Enbrel 50 mg subcutaneous injections once weekly.  She has not missed any doses of Arava or Enbrel recently.  She is tolerating both medications without any side effects.  She denies any recent rheumatoid arthritis flares.  She states that she has been experiencing intermittent paresthesias in both hands for the past 1 month.  She denies any overuse activities.  She denies any nocturnal symptoms.  She states that her symptoms are most severe midmorning and improve as the day goes on.  She has been having increased discomfort in both elbows but denies any joint swelling.  She has ongoing right hip joint pain, and she has right hip cortisone injections every 3 months.   Activities of Daily Living:  Patient reports morning stiffness for  5 minutes.   Patient Denies nocturnal pain.  Difficulty dressing/grooming: Denies Difficulty climbing stairs: Denies Difficulty getting out of chair: Denies Difficulty using hands for taps, buttons, cutlery, and/or writing: Denies  Review of Systems  Constitutional: Negative for fatigue.  HENT: Negative for mouth sores, mouth dryness and nose dryness.   Eyes: Negative for pain, visual disturbance and dryness.  Respiratory: Negative for cough, hemoptysis, shortness of breath and difficulty breathing.   Cardiovascular: Negative for chest pain, palpitations, hypertension and swelling in legs/feet.  Gastrointestinal: Negative for blood in stool, constipation and diarrhea.  Endocrine: Negative for increased urination.    Genitourinary: Negative for painful urination.  Musculoskeletal: Positive for arthralgias, joint pain and morning stiffness. Negative for joint swelling, myalgias, muscle weakness, muscle tenderness and myalgias.  Skin: Negative for color change, pallor, rash, hair loss, nodules/bumps, skin tightness, ulcers and sensitivity to sunlight.  Allergic/Immunologic: Negative for susceptible to infections.  Neurological: Positive for parasthesias. Negative for dizziness, numbness, headaches and weakness.  Hematological: Negative for swollen glands.  Psychiatric/Behavioral: Negative for depressed mood and sleep disturbance. The patient is not nervous/anxious.     PMFS History:  Patient Active Problem List   Diagnosis Date Noted  . Hyperlipidemia 07/14/2015  . Palpitation 05/11/2014  . PVC (premature ventricular contraction) 05/11/2014  . Hypokalemia 05/11/2014  . Essential hypertension 05/11/2014  . Mitral regurgitation 05/11/2014  . Arthritis, hip 02/03/2013    Past Medical History:  Diagnosis Date  . Arthritis   . Complication of anesthesia    when block attempted ax -turned red in preop-surg delayed  . Fibromyalgia   . GERD (gastroesophageal reflux disease)   . Hypertension   . Hypothyroidism   . MVP (mitral valve prolapse)   . Osteoporosis   . 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 Heart attack Father   . Heart disease Mother   . Stroke Mother   . Colon cancer Other   . Colon polyps Other   . Hypothyroidism Son    Past Surgical History:  Procedure Laterality Date  .  ABDOMINAL HYSTERECTOMY    . CESAREAN SECTION    . CHOLECYSTECTOMY    . COLONOSCOPY    . JOINT REPLACEMENT    . KNEE ARTHROSCOPY Right 2/11  . TOTAL HIP ARTHROPLASTY Left 02/03/2013   Procedure: TOTAL HIP ARTHROPLASTY;  Surgeon: Kerin Salen, MD;  Location: [facility];  Service: Orthopedics;  Laterality: Left;  . ULNAR NERVE TRANSPOSITION  09/17/2011   Procedure:  ULNAR NERVE DECOMPRESSION/TRANSPOSITION;  Surgeon: Cammie Sickle., MD;  Location: [facility];  Service: Orthopedics;  Laterality: Left;  decompression  . WRIST ARTHROSCOPY Right    x2-fusion    . WRIST ARTHROSCOPY Left    Social History   Social History Narrative  . Not on file    There is no immunization history on file for this patient.   Objective: Vital Signs: BP (!) 154/76 (BP Location: Left Arm, Patient Position: Sitting, Cuff Size: Small)   Pulse 66   Resp 12   Ht 5' 2.5" (1.588 m)   Wt 196 lb 9.6 oz (89.2 kg)   BMI 35.39 kg/m    Physical Exam Vitals and nursing note reviewed.  Constitutional:      Appearance: She is well-developed.  HENT:     Head: Normocephalic and atraumatic.  Eyes:     Conjunctiva/sclera: Conjunctivae normal.  Pulmonary:     Effort: Pulmonary effort is normal.  Abdominal:     General: Bowel sounds are normal.     Palpations: Abdomen is soft.  Musculoskeletal:     Cervical back: Normal range of motion.  Lymphadenopathy:     Cervical: No cervical adenopathy.  Skin:    General: Skin is warm and dry.     Capillary Refill: Capillary refill takes less than 2 seconds.  Neurological:     Mental Status: She is alert and oriented to person, place, and time.  Psychiatric:        Behavior: Behavior normal.      Musculoskeletal Exam:  C-spine, thoracic spine, and lumbar spine good ROM.  Shoulder joints and elbow joints good ROM.  Tenderness over the medial epicondyle of both elbows.  Right wrist fused.  Left wrist slightly limited ROM.  No tenderness or synovitis of MCP or PIP joints.  Complete fist formation bilaterally.  Hip joints good ROM.  Knee joints good ROM with no warmth or effusion. Ankle joints good ROM with no tenderness or inflammation.   CDAI Exam: CDAI Score: -- Patient Global: --; Provider Global: -- Swollen: --; Tender: -- Joint Exam 02/14/2020   No joint exam has been documented for this visit   There is  currently no information documented on the homunculus. Go to the Rheumatology activity and complete the homunculus joint exam.  Investigation: No additional findings.  Imaging: No results found.  Recent Labs: Lab Results  Component Value Date   WBC 5.1 11/10/2019   HGB 12.5 11/10/2019   PLT 212 11/10/2019   NA 142 11/08/2019   K 4.2 11/08/2019   CL 106 11/08/2019   CO2 23 11/08/2019   GLUCOSE 107 (H) 11/08/2019   BUN 12 11/08/2019   CREATININE 0.88 11/08/2019   BILITOT 1.0 08/17/2019   ALKPHOS 72 01/20/2017   AST 28 11/10/2019   ALT 29 11/10/2019   PROT 6.3 08/17/2019   ALBUMIN 4.2 01/20/2017   CALCIUM 9.2 11/08/2019   GFRAA 77 11/08/2019   QFTBGOLDPLUS NEGATIVE 11/10/2019    Speciality Comments: Recieves Enbrel through [employer] [facility]  Procedures:  No  procedures performed Allergies: Humira [adalimumab], Clinoril [sulindac], Erythromycin, Hydrocodone, Ibuprofen, Penicillins, Plaquenil [hydroxychloroquine], Sulfa antibiotics, Trimethoprim, and Vancomycin cross reactors   Assessment / Plan:     Visit Diagnoses: Rheumatoid arthritis with rheumatoid factor of multiple sites without organ or systems involvement ([place]) - +RF, +ANA, erosive disease: She has no tenderness or synovitis on exam.  She has not had any recent rheumatoid arthritis flares.  She is clinically doing well on Enbrel 50 mg subcutaneous injections once every 7 days and Arava 10 mg 1 tablet by mouth daily.  She has been tolerating the reduced dose of Arava without any side effects.  Her injection site reaction after each Enbrel injection has improved since starting to take Zyrtec as recommended.  She feels as though this combination has been effective at managing her rheumatoid arthritis.  She will continue on the current treatment regimen.  She was advised to notify us if she develops increased joint pain or joint swelling.  She will follow-up in the office in 2 months  High risk medication use - Arava 10  mg 1 tablet daily and Enbrel SureClick 50 mg every 7 days, previously on MTX 0.6 ml sq inj q7d-D/c due to nausea.  CBC within normal limits on 11/10/2019.  BMP on 11/08/2019 obtained.  LFTs within normal limits on 11/10/2019.  We will recheck CBC and CMP today.  TB gold was negative on 11/10/2019 and we will continue to monitor yearly.- Plan: CBC with Differential/Platelet, COMPLETE METABOLIC PANEL WITH GFR She has not had any recent infections.  She has received both COVID-19 vaccinations.  She is planning on receiving the booster vaccine.  She was advised to notify us or her PCP if she develops the COVID-19 infection in order to receive the antibody infusion.  History of total left hip replacement: She has good ROM with no discomfort.    DDD (degenerative disc disease), lumbar: She has good ROM with no disocmfor  Myofascial pain syndrome: She is not having any myalgias or muscle tenderness at this time.  Medial epicondylitis of both elbows: She has tenderness to palpation over bilateral medial condyles.  Different treatment options were discussed.  She was given a handout of exercises to perform and was encouraged to use Voltaren gel topically as needed for pain relief.  We discussed a referral to physical therapy but she declined at this time.  Osteopenia of multiple sites: DEXA 03/03/2018 BMD 0.709 with T score -1.3.  Previous DEXA 01/2016: BMD 0.687 with T score -1.5.  She previously was on Forteo and Fosamax.  She is currently taking calcium and vitamin D as recommended.  She will be due to update DEXA in September 2021.  Future orders placed today.  Frequent falls: She has not had any recent falls.   Paresthesia of both hands: She has been experiencing intermittent paresthesias in both hands for the past 1 month.  She has not had any nocturnal symptoms.  She has not been performing any overuse activities recently.  Her symptoms are most severe midmorning and seem to improve throughout the day.  A  referral to Dr. Ron Agee per request of patient was placed today to schedule an NCV with EMG for further evaluation.   Other medical conditions are listed as follows:   History of hyperlipidemia  History of hypothyroidism  History of hypertension  History of neuropathy  Orders: Orders Placed This Encounter  Procedures  . DG BONE DENSITY (DXA)  . CBC with Differential/Platelet  . COMPLETE METABOLIC PANEL WITH  GFR   No orders of the defined types were placed in this encounter.   Face-to-face time spent with patient was 30 minutes. Greater than 50% of time was spent in counseling and coordination of care.  Follow-Up Instructions: Return in about 3 months (around 05/16/2020) for Rheumatoid arthritis, DDD.   Ofilia Neas, PA-C  Note - This record has been created using Dragon software.  Chart creation errors have been sought, but may not always  have been located. Such creation errors do not reflect on  the standard of medical care.

## 2020-01-31 NOTE — Telephone Encounter (Signed)
Please schedule an office visit for further evaluation.  She may require a NCV with EMG.

## 2020-01-31 NOTE — Telephone Encounter (Signed)
Patient scheduled for 02/14/2020 @ 1:20 pm.

## 2020-02-02 DIAGNOSIS — M069 Rheumatoid arthritis, unspecified: Secondary | ICD-10-CM | POA: Diagnosis not present

## 2020-02-02 DIAGNOSIS — E7800 Pure hypercholesterolemia, unspecified: Secondary | ICD-10-CM | POA: Diagnosis not present

## 2020-02-02 DIAGNOSIS — K219 Gastro-esophageal reflux disease without esophagitis: Secondary | ICD-10-CM | POA: Diagnosis not present

## 2020-02-02 DIAGNOSIS — I10 Essential (primary) hypertension: Secondary | ICD-10-CM | POA: Diagnosis not present

## 2020-02-02 DIAGNOSIS — E039 Hypothyroidism, unspecified: Secondary | ICD-10-CM | POA: Diagnosis not present

## 2020-02-02 DIAGNOSIS — E785 Hyperlipidemia, unspecified: Secondary | ICD-10-CM | POA: Diagnosis not present

## 2020-02-06 ENCOUNTER — Other Ambulatory Visit: Payer: Self-pay | Admitting: Rheumatology

## 2020-02-06 DIAGNOSIS — M0579 Rheumatoid arthritis with rheumatoid factor of multiple sites without organ or systems involvement: Secondary | ICD-10-CM

## 2020-02-07 NOTE — Telephone Encounter (Signed)
Last Visit: 11/10/2019 Next Visit: 04/18/2020 Labs: 11/10/2019 CBC WNL. LFTs WNL. BMP Glucose 107  Current Dose per office note on 11/10/2019: Arava 10 mg 1 tablet daily Dx:  Rheumatoid arthritis with rheumatoid factor of multiple sites without organ or systems involvement   Attempted to contact the patient and left message for patient to call the office. Will remind patient she is due to update labs.   Okay to refill Arava and Gabapentin?

## 2020-02-14 ENCOUNTER — Ambulatory Visit: Payer: PPO | Admitting: Physician Assistant

## 2020-02-14 ENCOUNTER — Other Ambulatory Visit: Payer: Self-pay

## 2020-02-14 ENCOUNTER — Encounter: Payer: Self-pay | Admitting: Physician Assistant

## 2020-02-14 ENCOUNTER — Telehealth: Payer: Self-pay | Admitting: Rheumatology

## 2020-02-14 VITALS — BP 154/76 | HR 66 | Resp 12 | Ht 62.5 in | Wt 196.6 lb

## 2020-02-14 DIAGNOSIS — R202 Paresthesia of skin: Secondary | ICD-10-CM

## 2020-02-14 DIAGNOSIS — Z8679 Personal history of other diseases of the circulatory system: Secondary | ICD-10-CM

## 2020-02-14 DIAGNOSIS — Z96642 Presence of left artificial hip joint: Secondary | ICD-10-CM | POA: Diagnosis not present

## 2020-02-14 DIAGNOSIS — Z79899 Other long term (current) drug therapy: Secondary | ICD-10-CM | POA: Diagnosis not present

## 2020-02-14 DIAGNOSIS — M5136 Other intervertebral disc degeneration, lumbar region: Secondary | ICD-10-CM

## 2020-02-14 DIAGNOSIS — M8589 Other specified disorders of bone density and structure, multiple sites: Secondary | ICD-10-CM

## 2020-02-14 DIAGNOSIS — M0579 Rheumatoid arthritis with rheumatoid factor of multiple sites without organ or systems involvement: Secondary | ICD-10-CM | POA: Diagnosis not present

## 2020-02-14 DIAGNOSIS — M7702 Medial epicondylitis, left elbow: Secondary | ICD-10-CM

## 2020-02-14 DIAGNOSIS — R296 Repeated falls: Secondary | ICD-10-CM

## 2020-02-14 DIAGNOSIS — M7701 Medial epicondylitis, right elbow: Secondary | ICD-10-CM

## 2020-02-14 DIAGNOSIS — Z8639 Personal history of other endocrine, nutritional and metabolic disease: Secondary | ICD-10-CM

## 2020-02-14 DIAGNOSIS — Z8669 Personal history of other diseases of the nervous system and sense organs: Secondary | ICD-10-CM

## 2020-02-14 DIAGNOSIS — M7918 Myalgia, other site: Secondary | ICD-10-CM | POA: Diagnosis not present

## 2020-02-14 LAB — COMPLETE METABOLIC PANEL WITH GFR
AG Ratio: 2.2 (calc) (ref 1.0–2.5)
ALT: 37 U/L — ABNORMAL HIGH (ref 6–29)
AST: 37 U/L — ABNORMAL HIGH (ref 10–35)
Albumin: 4.3 g/dL (ref 3.6–5.1)
Alkaline phosphatase (APISO): 56 U/L (ref 37–153)
BUN/Creatinine Ratio: 8 (calc) (ref 6–22)
BUN: 8 mg/dL (ref 7–25)
CO2: 26 mmol/L (ref 20–32)
Calcium: 9.2 mg/dL (ref 8.6–10.4)
Chloride: 108 mmol/L (ref 98–110)
Creat: 1.03 mg/dL — ABNORMAL HIGH (ref 0.60–0.93)
GFR, Est African American: 64 mL/min/{1.73_m2} (ref >=60)
GFR, Est Non African American: 55 mL/min/{1.73_m2} — ABNORMAL LOW (ref >=60)
Globulin: 2 g/dL (calc) (ref 1.9–3.7)
Glucose, Bld: 106 mg/dL — ABNORMAL HIGH (ref 65–99)
Potassium: 3.4 mmol/L — ABNORMAL LOW (ref 3.5–5.3)
Sodium: 144 mmol/L (ref 135–146)
Total Bilirubin: 1.1 mg/dL (ref 0.2–1.2)
Total Protein: 6.3 g/dL (ref 6.1–8.1)

## 2020-02-14 LAB — CBC WITH DIFFERENTIAL/PLATELET
Absolute Monocytes: 598 cells/uL (ref 200–950)
Basophils Absolute: 69 cells/uL (ref 0–200)
Basophils Relative: 1.5 %
Eosinophils Absolute: 179 cells/uL (ref 15–500)
Eosinophils Relative: 3.9 %
HCT: 35.2 % (ref 35.0–45.0)
Hemoglobin: 12 g/dL (ref 11.7–15.5)
Lymphs Abs: 1725 cells/uL (ref 850–3900)
MCH: 34 pg — ABNORMAL HIGH (ref 27.0–33.0)
MCHC: 34.1 g/dL (ref 32.0–36.0)
MCV: 99.7 fL (ref 80.0–100.0)
MPV: 10.5 fL (ref 7.5–12.5)
Monocytes Relative: 13 %
Neutro Abs: 2029 cells/uL (ref 1500–7800)
Neutrophils Relative %: 44.1 %
Platelets: 210 10*3/uL (ref 140–400)
RBC: 3.53 10*6/uL — ABNORMAL LOW (ref 3.80–5.10)
RDW: 13.7 % (ref 11.0–15.0)
Total Lymphocyte: 37.5 %
WBC: 4.6 10*3/uL (ref 3.8–10.8)

## 2020-02-14 NOTE — Telephone Encounter (Signed)
Please place referral to [facility] for a NCV with EMG performed by Dr. Ron Agee.

## 2020-02-14 NOTE — Patient Instructions (Addendum)
COVID-19 vaccine recommendations:   COVID-19 vaccine is recommended for everyone (unless you are allergic to a vaccine component), even if you are on a medication that suppresses your immune system.   If you are on Methotrexate, Cellcept (mycophenolate), Rinvoq, Morrie Sheldon, and Olumiant- hold the medication for 1 week after each vaccine. Hold Methotrexate for 2 weeks after the single dose COVID-19 vaccine.   If you are on Orencia subcutaneous injection - hold medication one week prior to and one week after the first COVID-19 vaccine dose (only).   If you are on Orencia IV infusions- time vaccination administration so that the first COVID-19 vaccination will occur four weeks after the infusion and postpone the subsequent infusion by one week.   If you are on Cyclophosphamide or Rituxan infusions please contact your doctor prior to receiving the COVID-19 vaccine.   Do not take Tylenol or ant anti-inflammatory medications (NSAIDs) 24 hours prior to the COVID-19 vaccination.   There is no direct evidence about the efficacy of the COVID-19 vaccine in individuals who are on medications that suppress the immune system.   Even if you are fully vaccinated, and you are on any medications that suppress your immune system, please continue to wear a mask, maintain at least six feet social distance and practice hand hygiene.   If you develop a COVID-19 infection, please contact your PCP or our office to determine if you need antibody infusion.  We anticipate that a booster vaccine will be available soon for immunosuppressed individuals. Please call our office before receiving your booster dose to make adjustments to your medication regimen.   [email or web address]    [facility]'s Elbow Rehab Ask your health care provider which exercises are safe for you. Do exercises exactly as told by your health care provider and adjust them as directed. It is  normal to feel mild stretching, pulling, tightness, or discomfort as you do these exercises. Stop right away if you feel sudden pain or your pain gets worse. Do not begin these exercises until told by your health care provider. Stretching and range-of-motion exercises These exercises warm up your muscles and joints and improve the movement and flexibility of your elbow. Wrist extension  1. Straighten your left / right elbow in front of you with your palm facing up toward the ceiling. ? If told by your health care provider, bend your left / right elbow to a 90-degree angle (right angle) at your side. 2. With your other hand, gently pull your left / right hand and fingers toward the floor (extension). Stop when you feel a gentle stretch on the palm side of your forearm. 3. Hold this position for __________ seconds. Repeat __________ times. Complete this exercise __________ times a day. Wrist flexion  1. Straighten your left / right elbow in front of you with your palm facing down toward the floor. ? If told by your health care provider, bend your left / right elbow to a 90-degree angle (right angle) at your side. 2. With your other hand, gently push over the back of your left / right hand so your fingers point toward the floor (flexion). Stop when you feel a gentle stretch on the back of your forearm. 3. Hold this position for __________ seconds. Repeat __________ times. Complete this exercise __________ times a day. Forearm rotation, supination 1. Sit or stand with your elbows at your side. 2. Bend your left / right elbow to a 90-degree angle (right angle). 3. Using your uninjured hand, turn your left /  right palm up toward the ceiling (supination) until you feel a gentle stretch along the inside of your forearm. 4. Hold this position for __________ seconds. Repeat __________ times. Complete this exercise __________ times a day. Forearm rotation, pronation 1. Sit or stand with your elbows at  your side. 2. Bend your left / right elbow to a 90-degree angle (right angle). 3. Using your uninjured hand, turn your left / right palm down toward the floor (pronation) until you feel a gentle stretch along the top of your forearm. 4. Hold this position for __________ seconds. Repeat __________ times. Complete this exercise __________ times a day. Strengthening exercises These exercises build strength and endurance in your elbow. Endurance is the ability to use your muscles for a long time, even after they get tired. Wrist flexion  1. Sit with your left / right forearm supported on a table or other surface and your palm turned up toward the ceiling. Let your left / right wrist extend over the edge of the surface. 2. Hold a __________ weight or a piece of rubber exercise band or tubing. ? If using a rubber exercise band or tubing, hold the other end of the tubing with your other hand. 3. Slowly bend your wrist so your hand moves up toward the ceiling (flexion). Try to only move your wrist and keep the rest of your arm still. 4. Hold this position for __________ seconds. 5. Slowly return to the starting position. Repeat __________ times. Complete this exercise __________ times a day. Wrist flexion, eccentric 1. Sit with your left / right forearm palm-up and supported on a table or other surface. Let your left / right wrist extend over the edge of the surface. 2. Hold a __________ weight or a piece of rubber exercise band or tubing in your left / right hand. ? If using a rubber exercise band or tubing, hold the other end of the tubing with your other hand. 3. Use your uninjured hand to move your left / right hand up toward the ceiling. 4. Take your uninjured hand away and slowly return to the starting position using only your left / right hand (eccentric flexion). Repeat __________ times. Complete this exercise __________ times a day. Forearm rotation, pronation To do this exercise, you will  need a lightweight hammer or rubber mallet. 1. Sit with your left / right forearm supported on a table or other surface. Bend your elbow to a 90-degree angle (right angle). Position your forearm so that your palm is facing up toward the ceiling, with your hand resting over the edge of the table. 2. Hold a hammer in your left / right hand. ? To make this exercise easier, hold the hammer near the head of the hammer. ? To make this exercise harder, hold the hammer near the end of the handle. 3. Without moving your elbow, slowly turn (rotate) your forearm so your palm faces down toward the floor (pronation). 4. Hold this position for __________ seconds. 5. Slowly return to the starting position. Repeat __________ times. Complete this exercise __________ times a day. Shoulder blade squeeze 1. Sit in a stable chair or stand with good posture. If you are sitting down, do not let your back touch the back of the chair. 2. Your arms should be at your sides with your elbows bent to a 90-degree angle (right angle). Position your forearms so that your thumbs are facing the ceiling (neutral position). 3. Without lifting your shoulders up, squeeze your shoulder blades  tightly together. 4. Hold this position for __________ seconds. 5. Slowly release and return to the starting position. Repeat __________ times. Complete this exercise __________ times a day. This information is not intended to replace advice given to you by your health care provider. Make sure you discuss any questions you have with your health care provider. Document Revised: 10/08/2018 Document Reviewed: 08/11/2018 Elsevier Patient Education  [place].

## 2020-02-14 NOTE — Telephone Encounter (Signed)
Referral placed and patient is aware. 

## 2020-02-14 NOTE — Telephone Encounter (Signed)
Patient called stating she would like to have the nerve conduction study with Dr. Ron Agee at [facility].  Patient states they need a referral before she can schedule an appointment.

## 2020-02-15 NOTE — Progress Notes (Signed)
RBC count is low. Hgb and hct are WNL.   Creatinine is borderline elevated and GFR is mildly low-55.   AST and ALT are elevated. She is taking low dose Arava 10 mg 1 tablet daily.  Please clarify if the patient is taking NSAIDs, tylenol, or drinking alcohol. Please advise the patient to avoid all of these things.  Please also ask if she has started any other new medications.

## 2020-02-21 ENCOUNTER — Encounter: Payer: Self-pay | Admitting: Rheumatology

## 2020-02-21 DIAGNOSIS — M85852 Other specified disorders of bone density and structure, left thigh: Secondary | ICD-10-CM | POA: Diagnosis not present

## 2020-02-21 DIAGNOSIS — M85851 Other specified disorders of bone density and structure, right thigh: Secondary | ICD-10-CM | POA: Diagnosis not present

## 2020-02-22 ENCOUNTER — Telehealth: Payer: Self-pay | Admitting: *Deleted

## 2020-02-22 NOTE — Telephone Encounter (Signed)
Received DEXA results from [facility].  Date of Scan: 02/21/2020 Lowest T-score and site measured: -2.0 Right Total Hip Significant changes in BMD and site measured (5% and above): -13% in Right total femur BMD 0.703  Current Regimen: Taking Calcium and vitamin D  Recommendation: Schedule appointment to discuss results and treatment options.   Copy in folder and original sent to scan place.

## 2020-03-08 ENCOUNTER — Other Ambulatory Visit: Payer: Self-pay | Admitting: Physician Assistant

## 2020-03-08 DIAGNOSIS — E7800 Pure hypercholesterolemia, unspecified: Secondary | ICD-10-CM | POA: Diagnosis not present

## 2020-03-08 DIAGNOSIS — E039 Hypothyroidism, unspecified: Secondary | ICD-10-CM | POA: Diagnosis not present

## 2020-03-08 DIAGNOSIS — M25551 Pain in right hip: Secondary | ICD-10-CM | POA: Diagnosis not present

## 2020-03-08 DIAGNOSIS — I10 Essential (primary) hypertension: Secondary | ICD-10-CM | POA: Diagnosis not present

## 2020-03-08 DIAGNOSIS — M0579 Rheumatoid arthritis with rheumatoid factor of multiple sites without organ or systems involvement: Secondary | ICD-10-CM

## 2020-03-08 DIAGNOSIS — M069 Rheumatoid arthritis, unspecified: Secondary | ICD-10-CM | POA: Diagnosis not present

## 2020-03-08 NOTE — Telephone Encounter (Signed)
Last Visit: 02/14/2020 Next Visit: 05/15/2020 Labs: 02/14/2020 RBC count is low. Hgb and hct are WNL. AST and ALT are elevated.  Current Dose per office note 02/14/2020: Arava 10 mg 1 tablet daily  DX:  Rheumatoid arthritis with rheumatoid factor of multiple sites without organ or systems involvement  Okay to refill Arava?

## 2020-03-08 NOTE — Telephone Encounter (Signed)
Please advise patient to stop NSAIDs.  Recheck CMP with GFR in 1 month.

## 2020-03-08 NOTE — Telephone Encounter (Signed)
Patient advised to stop NSAIDs.  Recheck CMP with GFR in 1 month.

## 2020-03-20 DIAGNOSIS — M069 Rheumatoid arthritis, unspecified: Secondary | ICD-10-CM | POA: Diagnosis not present

## 2020-03-20 DIAGNOSIS — M797 Fibromyalgia: Secondary | ICD-10-CM | POA: Diagnosis not present

## 2020-03-20 DIAGNOSIS — G5622 Lesion of ulnar nerve, left upper limb: Secondary | ICD-10-CM | POA: Diagnosis not present

## 2020-03-20 DIAGNOSIS — Z23 Encounter for immunization: Secondary | ICD-10-CM | POA: Diagnosis not present

## 2020-03-20 DIAGNOSIS — E039 Hypothyroidism, unspecified: Secondary | ICD-10-CM | POA: Diagnosis not present

## 2020-03-20 DIAGNOSIS — E7800 Pure hypercholesterolemia, unspecified: Secondary | ICD-10-CM | POA: Diagnosis not present

## 2020-03-20 DIAGNOSIS — I10 Essential (primary) hypertension: Secondary | ICD-10-CM | POA: Diagnosis not present

## 2020-03-20 DIAGNOSIS — Z131 Encounter for screening for diabetes mellitus: Secondary | ICD-10-CM | POA: Diagnosis not present

## 2020-03-20 DIAGNOSIS — Z0000 Encounter for general adult medical examination without abnormal findings: Secondary | ICD-10-CM | POA: Diagnosis not present

## 2020-04-12 ENCOUNTER — Telehealth: Payer: Self-pay | Admitting: Rheumatology

## 2020-04-12 ENCOUNTER — Telehealth: Payer: Self-pay

## 2020-04-12 NOTE — Telephone Encounter (Signed)
Too early for refill. Prescription sent to the pharmacy on 03/08/2020 with two additional refills.

## 2020-04-12 NOTE — Telephone Encounter (Signed)
To early to refill medication. Attempted to contact Caryl Pina to advise. Unable to leave a message, mailbox is full.

## 2020-04-12 NOTE — Telephone Encounter (Signed)
Requesting a refill on Gabapentin 300 mg sent to [facility]. Fax # [phone]

## 2020-04-12 NOTE — Telephone Encounter (Signed)
[facility] called requesting prescription refill of Leflunomide 10 mg for the patient.

## 2020-04-15 ENCOUNTER — Other Ambulatory Visit: Payer: Self-pay | Admitting: Rheumatology

## 2020-04-15 ENCOUNTER — Other Ambulatory Visit: Payer: Self-pay | Admitting: Physician Assistant

## 2020-04-15 DIAGNOSIS — M0579 Rheumatoid arthritis with rheumatoid factor of multiple sites without organ or systems involvement: Secondary | ICD-10-CM

## 2020-04-17 ENCOUNTER — Telehealth: Payer: Self-pay | Admitting: Rheumatology

## 2020-04-17 DIAGNOSIS — E7800 Pure hypercholesterolemia, unspecified: Secondary | ICD-10-CM | POA: Diagnosis not present

## 2020-04-17 DIAGNOSIS — E039 Hypothyroidism, unspecified: Secondary | ICD-10-CM | POA: Diagnosis not present

## 2020-04-17 DIAGNOSIS — M069 Rheumatoid arthritis, unspecified: Secondary | ICD-10-CM | POA: Diagnosis not present

## 2020-04-17 DIAGNOSIS — I10 Essential (primary) hypertension: Secondary | ICD-10-CM | POA: Diagnosis not present

## 2020-04-17 NOTE — Telephone Encounter (Signed)
Prescriptions to early to refill.

## 2020-04-17 NOTE — Telephone Encounter (Signed)
Pharmacy calling for a refill on Leflunomide 10 mg, and Gabapentin 300 mg. Fax# [phone]

## 2020-04-18 ENCOUNTER — Ambulatory Visit: Payer: PPO | Admitting: Physician Assistant

## 2020-04-19 ENCOUNTER — Other Ambulatory Visit: Payer: Self-pay | Admitting: Rheumatology

## 2020-04-19 ENCOUNTER — Ambulatory Visit: Payer: PPO | Admitting: Cardiology

## 2020-04-19 MED ORDER — ENBREL SURECLICK 50 MG/ML ~~LOC~~ SOAJ
50.0000 mg | SUBCUTANEOUS | 0 refills | Status: DC
Start: 2020-04-19 — End: 2020-07-05

## 2020-04-19 NOTE — Telephone Encounter (Signed)
Last Visit: 02/14/2020 Next Visit: 05/15/2020 Labs: 02/14/2020 RBC count is low. Hgb and hct are WNL. AST and ALT are elevated TB Gold: 11/10/2019 Neg   Current Dose per office note on 5/43/0148: Enbrel SureClick 50 mg every 7 days Dx: Rheumatoid arthritis with rheumatoid factor of multiple sites without organ or systems involvement   Okay to refill Enbrel?

## 2020-04-19 NOTE — Telephone Encounter (Signed)
Patient calling for refill on Enbrel sent to [place].

## 2020-04-25 ENCOUNTER — Telehealth: Payer: Self-pay | Admitting: Rheumatology

## 2020-04-25 ENCOUNTER — Other Ambulatory Visit: Payer: Self-pay

## 2020-04-25 DIAGNOSIS — Z79899 Other long term (current) drug therapy: Secondary | ICD-10-CM | POA: Diagnosis not present

## 2020-04-25 NOTE — Telephone Encounter (Signed)
Left message to advise patient her prescription for Enbrel was sent on 04/19/2020 and she will need to reach out to [employer] to set up shipment. Advised prescription for Kari [name] was sent on 03/08/2020 with 2 additional refills and she would need to contact the pharmacy for a refill. Patient advised it is too early to refill the Gabapentin.

## 2020-04-25 NOTE — Telephone Encounter (Signed)
Patient here for lab draw. Patient requesting refill on Leflunomide 10 mg, and Gabapentin 300 mg sent to [facility]. Also, Enbrel sent to [facility].

## 2020-04-26 LAB — CBC WITH DIFFERENTIAL/PLATELET
Absolute Monocytes: 562 cells/uL (ref 200–950)
Basophils Absolute: 110 cells/uL (ref 0–200)
Basophils Relative: 2.3 %
Eosinophils Absolute: 269 cells/uL (ref 15–500)
Eosinophils Relative: 5.6 %
HCT: 34.4 % — ABNORMAL LOW (ref 35.0–45.0)
Hemoglobin: 11.5 g/dL — ABNORMAL LOW (ref 11.7–15.5)
Lymphs Abs: 1699 cells/uL (ref 850–3900)
MCH: 32.4 pg (ref 27.0–33.0)
MCHC: 33.4 g/dL (ref 32.0–36.0)
MCV: 96.9 fL (ref 80.0–100.0)
MPV: 10.7 fL (ref 7.5–12.5)
Monocytes Relative: 11.7 %
Neutro Abs: 2160 cells/uL (ref 1500–7800)
Neutrophils Relative %: 45 %
Platelets: 189 10*3/uL (ref 140–400)
RBC: 3.55 10*6/uL — ABNORMAL LOW (ref 3.80–5.10)
RDW: 11.9 % (ref 11.0–15.0)
Total Lymphocyte: 35.4 %
WBC: 4.8 10*3/uL (ref 3.8–10.8)

## 2020-04-26 LAB — COMPLETE METABOLIC PANEL WITH GFR
AG Ratio: 2.2 (calc) (ref 1.0–2.5)
ALT: 34 U/L — ABNORMAL HIGH (ref 6–29)
AST: 34 U/L (ref 10–35)
Albumin: 4 g/dL (ref 3.6–5.1)
Alkaline phosphatase (APISO): 62 U/L (ref 37–153)
BUN: 16 mg/dL (ref 7–25)
CO2: 25 mmol/L (ref 20–32)
Calcium: 9 mg/dL (ref 8.6–10.4)
Chloride: 107 mmol/L (ref 98–110)
Creat: 0.92 mg/dL (ref 0.60–0.93)
GFR, Est African American: 73 mL/min/{1.73_m2} (ref >=60)
GFR, Est Non African American: 63 mL/min/{1.73_m2} (ref >=60)
Globulin: 1.8 g/dL (calc) — ABNORMAL LOW (ref 1.9–3.7)
Glucose, Bld: 97 mg/dL (ref 65–99)
Potassium: 3.5 mmol/L (ref 3.5–5.3)
Sodium: 141 mmol/L (ref 135–146)
Total Bilirubin: 0.7 mg/dL (ref 0.2–1.2)
Total Protein: 5.8 g/dL — ABNORMAL LOW (ref 6.1–8.1)

## 2020-04-26 NOTE — Progress Notes (Signed)
Mild anemia noted.  LFTs are mildly elevated.  We will continue to monitor labs.  Patient should take multivitamin with iron.

## 2020-04-26 NOTE — Progress Notes (Signed)
She should continue to take multivitamin with iron.  She may discuss anemia with her PCP at the follow-up visit.

## 2020-05-01 NOTE — Progress Notes (Signed)
Office Visit Note  Patient: Kari [name]             Date of Birth: 10-23-1950           MRN: [id number]             PCP: Aretta Nip, MD Referring: Aretta Nip, MD Visit Date: 05/15/2020 Occupation: @GUAROCC @  Subjective:  Discuss DEXA   History of Present Illness: Kari [name] is a 69 y.o. female with history of seropositive rheumatoid arthritis, DDD, and myofascial pain.  She is on arava 10 mg daily and Enbrel 50 mg sq injections once weekly.  She has been tolerating both medications without any side effects and has not missed any doses recently.  She denies any recent rheumatoid arthritis flares.  Patient reports that she has noticed some increased instability in bilateral lower extremities.  She has not been using a cane or walker to assist with ambulation.  She had a fall 2 days ago but did not have any injuries from the fall.  She previously attended physical therapy which was helpful but has not continued the home exercises.  She attributes some of her discomfort and instability to lower back pain as well as right hip pain.  She continues to get right hip cortisone injections every 3 months.  She would like to follow-up with Dr. Mayer Camel to discuss proceeding with a right hip replacement.  She states that her left hip replacement is doing well.     Activities of Daily Living:  Patient reports morning stiffness for a few   minutes.   Patient Reports nocturnal pain.  Difficulty dressing/grooming: Denies Difficulty climbing stairs: Reports Difficulty getting out of chair: Denies Difficulty using hands for taps, buttons, cutlery, and/or writing: Reports  Review of Systems  Constitutional: Positive for fatigue.  HENT: Positive for mouth sores. Negative for mouth dryness and nose dryness.   Eyes: Positive for dryness. Negative for pain and visual disturbance.  Respiratory: Negative for cough, hemoptysis, shortness of breath and difficulty breathing.   Cardiovascular:  Negative for chest pain, palpitations, hypertension and swelling in legs/feet.  Gastrointestinal: Negative for blood in stool, constipation and diarrhea.  Endocrine: Negative for increased urination.  Genitourinary: Negative for painful urination.  Musculoskeletal: Positive for arthralgias, joint pain, myalgias, morning stiffness and myalgias. Negative for joint swelling, muscle weakness and muscle tenderness.  Skin: Negative for color change, pallor, rash, hair loss, nodules/bumps, skin tightness, ulcers and sensitivity to sunlight.  Allergic/Immunologic: Negative for susceptible to infections.  Neurological: Positive for headaches and memory loss. Negative for dizziness, numbness and weakness.  Hematological: Negative for swollen glands.  Psychiatric/Behavioral: Positive for confusion. Negative for depressed mood and sleep disturbance. The patient is not nervous/anxious.     PMFS History:  Patient Active Problem List   Diagnosis Date Noted  . Hyperlipidemia 07/14/2015  . Palpitation 05/11/2014  . PVC (premature ventricular contraction) 05/11/2014  . Hypokalemia 05/11/2014  . Essential hypertension 05/11/2014  . Mitral regurgitation 05/11/2014  . Arthritis, hip 02/03/2013    Past Medical History:  Diagnosis Date  . Arthritis   . Complication of anesthesia    when block attempted ax -turned red in preop-surg delayed  . Fibromyalgia   . GERD (gastroesophageal reflux disease)   . Hypertension   . Hypothyroidism   . MVP (mitral valve prolapse)   . Osteoporosis   . 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  Heart attack Father   . Heart disease Mother   . Stroke Mother   . Colon cancer Other   . Colon polyps Other   . Hypothyroidism Son    Past Surgical History:  Procedure Laterality Date  . ABDOMINAL HYSTERECTOMY    . CESAREAN SECTION    . CHOLECYSTECTOMY    . COLONOSCOPY    . JOINT REPLACEMENT    . KNEE ARTHROSCOPY Right  2/11  . TOTAL HIP ARTHROPLASTY Left 02/03/2013   Procedure: TOTAL HIP ARTHROPLASTY;  Surgeon: Kerin Salen, MD;  Location: [facility];  Service: Orthopedics;  Laterality: Left;  . ULNAR NERVE TRANSPOSITION  09/17/2011   Procedure: ULNAR NERVE DECOMPRESSION/TRANSPOSITION;  Surgeon: Cammie Sickle., MD;  Location: [facility];  Service: Orthopedics;  Laterality: Left;  decompression  . WRIST ARTHROSCOPY Right    x2-fusion    . WRIST ARTHROSCOPY Left    Social History   Social History Narrative  . Not on file   Immunization History  Administered Date(s) Administered  . PFIZER SARS-COV-2 Vaccination 08/05/2019, 08/26/2019, 02/14/2020     Objective: Vital Signs: BP (!) 161/88 (BP Location: Left Arm, Patient Position: Sitting, Cuff Size: Normal)   Pulse 64   Resp 14   Ht 5' 2.5" (1.588 m)   Wt 190 lb 12.8 oz (86.5 kg)   BMI 34.34 kg/m    Physical Exam Vitals and nursing note reviewed.  Constitutional:      Appearance: She is well-developed.  HENT:     Head: Normocephalic and atraumatic.  Eyes:     Conjunctiva/sclera: Conjunctivae normal.  Pulmonary:     Effort: Pulmonary effort is normal.  Abdominal:     Palpations: Abdomen is soft.  Musculoskeletal:     Cervical back: Normal range of motion.  Skin:    General: Skin is warm and dry.     Capillary Refill: Capillary refill takes less than 2 seconds.  Neurological:     Mental Status: She is alert and oriented to person, place, and time.  Psychiatric:        Behavior: Behavior normal.      Musculoskeletal Exam: C-spine, thoracic spine, and lumbar spine good ROM.  Shoulder joints and elbow joints good ROM.  Right wrist fused.  Left wrist has good ROM with no discomfort or tenderness.  No tenderness or synovitis of MCP joints. PIP and DIP thickening consistent with osteoarthritis of both hands.   Left hip replacement has good ROM with no discomfort.  Right hip has good ROM with mild discomfort.  Knee joints good ROM  with no discomfort.  No warmth or effusion of knee joints.  Ankle joints good ROM with no tenderness or inflammation.   CDAI Exam: CDAI Score: -- Patient Global: --; Provider Global: -- Swollen: --; Tender: -- Joint Exam 05/15/2020   No joint exam has been documented for this visit   There is currently no information documented on the homunculus. Go to the Rheumatology activity and complete the homunculus joint exam.  Investigation: No additional findings.  Imaging: No results found.  Recent Labs: Lab Results  Component Value Date   WBC 4.8 04/25/2020   HGB 11.5 (L) 04/25/2020   PLT 189 04/25/2020   NA 141 04/25/2020   K 3.5 04/25/2020   CL 107 04/25/2020   CO2 25 04/25/2020   GLUCOSE 97 04/25/2020   BUN 16 04/25/2020   CREATININE 0.92 04/25/2020   BILITOT 0.7 04/25/2020   ALKPHOS 72 01/20/2017  AST 34 04/25/2020   ALT 34 (H) 04/25/2020   PROT 5.8 (L) 04/25/2020   ALBUMIN 4.2 01/20/2017   CALCIUM 9.0 04/25/2020   GFRAA 73 04/25/2020   QFTBGOLDPLUS NEGATIVE 11/10/2019    Speciality Comments: Recieves Enbrel through [employer] [facility]  Procedures:  No procedures performed Allergies: Humira [adalimumab], Clinoril [sulindac], Erythromycin, Hydrocodone, Ibuprofen, Penicillins, Plaquenil [hydroxychloroquine], Sulfa antibiotics, Trimethoprim, and Vancomycin cross reactors   Assessment / Plan:     Visit Diagnoses: Rheumatoid arthritis with rheumatoid factor of multiple sites without organ or systems involvement ([place]) - +RF, +ANA, erosive disease: She has no joint tenderness or synovitis on examination today.  She has not had any recent rheumatoid arthritis flares.  She is clinically doing well on Enbrel 50 mg subcutaneous injections every 7 days and Arava 10 mg 1 tablet by mouth daily.  She is tolerating both medications without any side effects and has not missed any doses recently.  She continues to have chronic lower back pain and right hip pain and is followed  closely by her orthopedists.  She has not had any other joint pain or inflammation at this time.  She will continue on the current treatment regimen.  She does not need any refills at this time.  She was advised to notify us if she develops increased joint pain or joint swelling.  She will follow-up in the office in 5 months.  High risk medication use - Arava 10 mg 1 tablet daily and Enbrel SureClick 50 mg every 7 days.   Previously on MTX 0.6 ml sq inj q7d-D/c due to nausea. CBC and CMP were updated on 04/25/20.  She will be due to update lab work in January and every 3 months.  Standing orders for CBC and CMP are in place.  TB gold negative 11/10/19 and will continue to be monitored yearly.   She has received all 3 covid-19 vaccine doses.  She is aware that she should hold Enbrel and Arava if she develops signs or symptoms of an infection and to resume once the infection has completely cleared.   History of total left hip replacement: Doing well.  She has good ROM with no discomfort on exam.   DDD (degenerative disc disease), lumbar: Chronic and stiffness.  Midline spinal tenderness and tenderness over both SI joints on exam today.  No symptoms of radiculopathy.  She plans on scheduling a follow up visit with Dr. Ron Agee to discuss treatment options.   Myofascial pain syndrome: She experiences intermittent myalgias and muscle tenderness.  No tender points or hyperalgesia noted on examination today.  We discussed the importance of regular exercise.  Medial epicondylitis of both elbows: Resolved.  No tenderness to palpation on exam.   Osteopenia of multiple sites -Most recent DEXA updated on 02/21/20: Right total hip BMD 0.703 eiyh T-score -2.0. DEXA 03/03/2018:  BMD 0.709 with T score -1.3.   DEXA 01/2016: BMD 0.687 with T score -1.5.   She is taking a calcium and vitamin D supplement.  We will check vitamin D level today.   She was previously treated with fosamax and forteo (completed 2 years of  treatment).  Different treatment options were discussed today in detail.  She does not want to restart on fosamax due to previous intolerance.  She is currently taking protonix and has a history of GI upset.   Indications, contraindications, and potential side effects of reclast were discussed today.  Orders pending approval and lab work.  We discussed the importance  of performing resistive exercises and fall prevention. She has a history of frequent falls and has noticed some increased instability recently.  She had a fall on Saturday but did not have any injuries or fractures.  She declined a referral to PT but plans on performing home exercises for LE strengthening and fall prevention.  Discussed the use of a walker or cane to assist with ambulation.  Vitamin D deficiency - She is taking a vitamin D supplement on a daily basis.  Vitamin D level will be checked today.  Plan: VITAMIN D 25 Hydroxy (Vit-D Deficiency, Fractures)  Frequent falls: She went to physical therapy in spring 2020 to work on lower extremity muscle strengthening and fall prevention.  She has noticed some increased instability and had a recent fall on Saturday but did not have any injury or fractures.  We discussed going back to physical therapy but she would like to restart home exercises.  She was also encouraged to use a cane or walker to assist with ambulation.  Other medical conditions are listed:   Paresthesia of both hands: NCV with EMG on 03/20/20: left ulnar neuropathy.  Advised by Dr. Estanislado Pandy to follow up with orthopedist at [facility].   History of hypothyroidism  History of hyperlipidemia  History of neuropathy  History of hypertension   Orders: Orders Placed This Encounter  Procedures  . VITAMIN D 25 Hydroxy (Vit-D Deficiency, Fractures)   No orders of the defined types were placed in this encounter.    Follow-Up Instructions: Return in about 5 months (around 10/13/2020) for Rheumatoid  arthritis.   Ofilia Neas, PA-C  Note - This record has been created using Dragon software.  Chart creation errors have been sought, but may not always  have been located. Such creation errors do not reflect on  the standard of medical care.

## 2020-05-15 ENCOUNTER — Encounter: Payer: Self-pay | Admitting: Physician Assistant

## 2020-05-15 ENCOUNTER — Other Ambulatory Visit: Payer: Self-pay

## 2020-05-15 ENCOUNTER — Telehealth: Payer: Self-pay

## 2020-05-15 ENCOUNTER — Ambulatory Visit: Payer: PPO | Admitting: Physician Assistant

## 2020-05-15 VITALS — BP 161/88 | HR 64 | Resp 14 | Ht 62.5 in | Wt 190.8 lb

## 2020-05-15 DIAGNOSIS — Z79899 Other long term (current) drug therapy: Secondary | ICD-10-CM

## 2020-05-15 DIAGNOSIS — M5136 Other intervertebral disc degeneration, lumbar region: Secondary | ICD-10-CM | POA: Diagnosis not present

## 2020-05-15 DIAGNOSIS — M8589 Other specified disorders of bone density and structure, multiple sites: Secondary | ICD-10-CM

## 2020-05-15 DIAGNOSIS — M7918 Myalgia, other site: Secondary | ICD-10-CM

## 2020-05-15 DIAGNOSIS — Z8679 Personal history of other diseases of the circulatory system: Secondary | ICD-10-CM

## 2020-05-15 DIAGNOSIS — Z96642 Presence of left artificial hip joint: Secondary | ICD-10-CM | POA: Diagnosis not present

## 2020-05-15 DIAGNOSIS — M0579 Rheumatoid arthritis with rheumatoid factor of multiple sites without organ or systems involvement: Secondary | ICD-10-CM

## 2020-05-15 DIAGNOSIS — M7701 Medial epicondylitis, right elbow: Secondary | ICD-10-CM

## 2020-05-15 DIAGNOSIS — M7702 Medial epicondylitis, left elbow: Secondary | ICD-10-CM

## 2020-05-15 DIAGNOSIS — Z8669 Personal history of other diseases of the nervous system and sense organs: Secondary | ICD-10-CM

## 2020-05-15 DIAGNOSIS — R296 Repeated falls: Secondary | ICD-10-CM | POA: Diagnosis not present

## 2020-05-15 DIAGNOSIS — Z8639 Personal history of other endocrine, nutritional and metabolic disease: Secondary | ICD-10-CM | POA: Diagnosis not present

## 2020-05-15 DIAGNOSIS — R202 Paresthesia of skin: Secondary | ICD-10-CM | POA: Diagnosis not present

## 2020-05-15 DIAGNOSIS — E559 Vitamin D deficiency, unspecified: Secondary | ICD-10-CM

## 2020-05-15 NOTE — Telephone Encounter (Signed)
Please apply for IV Reclast per [name], PA-C. Thanks!

## 2020-05-15 NOTE — Patient Instructions (Signed)
Standing Labs We placed an order today for your standing lab work.   Please have your standing labs drawn in January and every 3 months   If possible, please have your labs drawn 2 weeks prior to your appointment so that the provider can discuss your results at your appointment.  We have open lab daily Monday through Thursday from 8:30-12:30 PM and 1:30-4:30 PM and Friday from 8:30-12:30 PM and 1:30-4:00 PM at the office of Dr. Shaili Deveshwar, [facility].   Please be advised, patients with office appointments requiring lab work will take precedents over walk-in lab work.  If possible, please come for your lab work on Monday and Friday afternoons, as you may experience shorter wait times. The office is located at [address], [address], [place], [place] [address] No appointment is necessary.   Labs are drawn by [facility]. Please bring your co-pay at the time of your lab draw.  You may receive a bill from [facility] for your lab work.  If you wish to have your labs drawn at another location, please call the office 24 hours in advance to send orders.  If you have any questions regarding directions or hours of operation,  please call [phone].   As a reminder, please drink plenty of water prior to coming for your lab work. Thanks!   

## 2020-05-16 LAB — VITAMIN D 25 HYDROXY (VIT D DEFICIENCY, FRACTURES): Vit D, 25-Hydroxy: 92 ng/mL (ref 30–100)

## 2020-05-16 NOTE — Telephone Encounter (Signed)
Findings of benefits investigation for Reclast   :   Insurance: [employer]   Phone:  [phone]  Plan is ACTIVE  Plan follows Medicare guidelines and covers 80% of the infusion and no authorization is required for J3489.No deductible applied. Patient will be required to pay the 20% coinsurance per each infusion, which will be applied to [employer]. Patient has met $671.79 as of $3400.  WEX#[id number]  Called patient and discussed. She would like to move forward with scheduling Reclast infusion.

## 2020-05-16 NOTE — Telephone Encounter (Signed)
Please order IV Reclast infusion. Thank you.

## 2020-05-16 NOTE — Progress Notes (Signed)
Vitamin D WNL.  Please advise the patient to continue maintenance dose of vitamin D. She will be starting on Reclast.

## 2020-05-19 ENCOUNTER — Encounter: Payer: Self-pay | Admitting: Cardiology

## 2020-05-19 ENCOUNTER — Other Ambulatory Visit: Payer: Self-pay | Admitting: Physician Assistant

## 2020-05-19 ENCOUNTER — Other Ambulatory Visit: Payer: Self-pay

## 2020-05-19 ENCOUNTER — Ambulatory Visit: Payer: PPO | Admitting: Cardiology

## 2020-05-19 VITALS — BP 140/70 | HR 64 | Ht 62.5 in | Wt 190.0 lb

## 2020-05-19 DIAGNOSIS — I493 Ventricular premature depolarization: Secondary | ICD-10-CM | POA: Diagnosis not present

## 2020-05-19 DIAGNOSIS — M8589 Other specified disorders of bone density and structure, multiple sites: Secondary | ICD-10-CM

## 2020-05-19 DIAGNOSIS — I10 Essential (primary) hypertension: Secondary | ICD-10-CM | POA: Diagnosis not present

## 2020-05-19 DIAGNOSIS — E7800 Pure hypercholesterolemia, unspecified: Secondary | ICD-10-CM | POA: Diagnosis not present

## 2020-05-19 NOTE — Patient Instructions (Signed)

## 2020-05-19 NOTE — Telephone Encounter (Signed)
Order for reclast placed today and reviewed with Dr. Estanislado Pandy.

## 2020-05-19 NOTE — Telephone Encounter (Signed)
Patient advised orders for Reclast have been placed. Patient provided with number to infusion center and will call to schedule.

## 2020-05-19 NOTE — Telephone Encounter (Signed)
FYI

## 2020-05-19 NOTE — Progress Notes (Signed)
Next infusion scheduled for Reclast and due for updated orders.  Last Visit: 05/15/20 Next Visit: 10/17/20  Labs: CBC and CMP were updated on 04/25/2020.  Creatinine was 0.92 and GFR was 63.  Calcium was 9.0. Vitamin D was 92 on 05/15/2020. She is taking calcium and vitamin D supplement as recommended. Most recent DEXA updated on 02/21/2020 T score -2.0 (statisically significant decrease).  Previously try Fosamax as well as Forteo.  Completed 2 years of Forteo.  Orders placed for reclast  x 1 dose along with premedication of Tylenol and Benadryl.  Standing CBC/CMP orders placed.  Order set was reviewed with Dr. Estanislado Pandy prior to signing.   [name], PA-C

## 2020-05-19 NOTE — Progress Notes (Signed)
Cardiology Office Note:    Date:  05/19/2020   ID:  Kari [name], DOB 05-27-1951, MRN [id number]  PCP:  Aretta Nip, MD  [facility] Cardiologist:  Candee Furbish, MD  [place] Electrophysiologist:  None   Referring MD: Aretta Nip, MD     History of Present Illness:    Kari [name] is a 69 y.o. female here for the follow-up of palpitations, hypertension.  Appreciate hypertension clinic seeing him.  Notes reviewed.  Family history of stroke and MI.  Echocardiogram previously reviewed showed normal EF.    Past Medical History:  Diagnosis Date  . Arthritis   . Complication of anesthesia    when block attempted ax -turned red in preop-surg delayed  . Fibromyalgia   . GERD (gastroesophageal reflux disease)   . Hypertension   . Hypothyroidism   . MVP (mitral valve prolapse)   . Osteoporosis   . Rheumatoid aortitis    ? arthritis    Past Surgical History:  Procedure Laterality Date  . ABDOMINAL HYSTERECTOMY    . CESAREAN SECTION    . CHOLECYSTECTOMY    . COLONOSCOPY    . JOINT REPLACEMENT    . KNEE ARTHROSCOPY Right 2/11  . 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 ULNAR NERVE TRANSPOSITION  09/17/2011   Procedure: ULNAR NERVE DECOMPRESSION/TRANSPOSITION;  Surgeon: Cammie Sickle., MD;  Location: [facility];  Service: Orthopedics;  Laterality: Left;  decompression  . WRIST ARTHROSCOPY Right    x2-fusion    . WRIST ARTHROSCOPY Left     Current Medications: Current Meds  Medication Sig  . Calcium-Vitamin D (CALTRATE 600 PLUS-VIT D PO) Take 1 tablet by mouth 2 (two) times daily.  . cholecalciferol (VITAMIN D) 1000 UNITS tablet Take 1,000 Units by mouth daily.  . cyclobenzaprine (FLEXERIL) 10 MG tablet Take 10 mg by mouth as needed.   . diclofenac Sodium (VOLTAREN) 1 % GEL Apply topically.  Marland Kitchen  eletriptan (RELPAX) 20 MG tablet as needed.   [name] SURECLICK 50 MG/ML injection Inject 50 mg into the skin once a week.  . fluticasone (FLONASE) 50 MCG/ACT nasal spray Place 1 spray into the nose daily.  Marland Kitchen gabapentin (NEURONTIN) 300 MG capsule TAKE FOUR CAPSULES BY MOUTH DAILY  . leflunomide (ARAVA) 10 MG tablet TAKE ONE TABLET BY MOUTH ONCE DAILY  . levothyroxine (SYNTHROID) 75 MCG tablet Take 75 mcg by mouth every morning.  Marland Kitchen losartan (COZAAR) 100 MG tablet Take 1 tablet (100 mg total) by mouth daily.  . Multiple Vitamin (MULTIVITAMIN WITH MINERALS) TABS Take 1 tablet by mouth daily.  . nadolol (CORGARD) 40 MG tablet Take 40 mg by mouth every morning.  . Omega-3 Fatty Acids (FISH OIL PO) Take 1 capsule by mouth daily.  . ondansetron (ZOFRAN) 4 MG tablet TAKE ONE TABLET BY MOUTH EVERY 8 HOURS AS NEEDED FOR NAUSEA AND VOMITING  . pantoprazole (PROTONIX) 40 MG tablet Take 40 mg by mouth 2 (two) times daily.   Marland Kitchen PROAIR HFA 108 (90 BASE) MCG/ACT inhaler as needed.   . rosuvastatin (CRESTOR) 20 MG tablet Take 1 tablet (20 mg total) by mouth daily.  Marland Kitchen spironolactone (ALDACTONE) 25 MG tablet Take 1 tablet (25 mg total) by mouth daily.  . sucralfate (CARAFATE) 1 g tablet Take 1 g by mouth as needed (stomach).   . valACYclovir (  VALTREX) 1000 MG tablet as needed.     Allergies:   Humira [adalimumab], Clinoril [sulindac], Erythromycin, Hydrocodone, Ibuprofen, Penicillins, Plaquenil [hydroxychloroquine], Sulfa antibiotics, Trimethoprim, and Vancomycin cross reactors   Social History   Socioeconomic History  . Marital status: Widowed    Spouse name: Not on file  . Number of children: Not on file  . Years of education: Not on file  . Highest education level: Not on file  Occupational History  . Not on file  Tobacco Use  . Smoking status: Never Smoker  . Smokeless tobacco: Never Used  Vaping Use  . Vaping Use: Never used  Substance and Sexual Activity  . Alcohol use: Yes    Comment:  rarely   . Drug use: No  . Sexual activity: Not on file  Other Topics Concern  . Not on file  Social History Narrative  . Not on file   Social Determinants of Health   Financial Resource Strain:   . Difficulty of Paying Living Expenses: Not on file  Food Insecurity:   . Worried About Charity fundraiser in the Last Year: Not on file  . Ran Out of Food in the Last Year: Not on file  Transportation Needs:   . Lack of Transportation (Medical): Not on file  . Lack of Transportation (Non-Medical): Not on file  Physical Activity:   . Days of Exercise per Week: Not on file  . Minutes of Exercise per Session: Not on file  Stress:   . Feeling of Stress : Not on file  Social Connections:   . Frequency of Communication with Friends and Family: Not on file  . Frequency of Social Gatherings with Friends and Family: Not on file  . Attends Religious Services: Not on file  . Active Member of Clubs or Organizations: Not on file  . Attends Archivist Meetings: Not on file  . Marital Status: Not on file     Family History: The patient's family history includes Colon cancer in an other family member; Colon polyps in an other family member; Heart attack in her father; Heart disease in her mother; Hypothyroidism in her son; Stroke in her mother.  ROS:   Please see the history of present illness.     All other systems reviewed and are negative.  EKGs/Labs/Other Studies Reviewed:    The following studies were reviewed today:  Echocardiogram 11/08/2019:  1. Left ventricular ejection fraction, by estimation, is 60 to 65%. The  left ventricle has normal function. The left ventricle has no regional  wall motion abnormalities. There is moderate left ventricular hypertrophy  of the basal-septal segment. Left  ventricular diastolic parameters are consistent with Grade I diastolic  dysfunction (impaired relaxation). Elevated left ventricular end-diastolic  pressure.  2. Right  ventricular systolic function is normal. The right ventricular  size is normal.  3. No evidence of mitral valve prolapse. The mitral valve is normal in  structure. No evidence of mitral valve regurgitation. No evidence of  mitral stenosis.  4. The aortic valve is tricuspid. Aortic valve regurgitation is not  visualized. Mild aortic valve sclerosis is present, with no evidence of  aortic valve stenosis.  5. The inferior vena cava is normal in size with greater than 50%  respiratory variability, suggesting right atrial pressure of 3 mmHg.    Recent Labs: 04/25/2020: ALT 34; BUN 16; Creat 0.92; Hemoglobin 11.5; Platelets 189; Potassium 3.5; Sodium 141  Recent Lipid Panel No results found for: CHOL, TRIG, HDL,  CHOLHDL, VLDL, LDLCALC, LDLDIRECT   Risk Assessment/Calculations:       Physical Exam:    VS:  BP 140/70   Pulse 64   Ht 5' 2.5" (1.588 m)   Wt 190 lb (86.2 kg)   SpO2 96%   BMI 34.20 kg/m     Wt Readings from Last 3 Encounters:  05/19/20 190 lb (86.2 kg)  05/15/20 190 lb 12.8 oz (86.5 kg)  02/14/20 196 lb 9.6 oz (89.2 kg)     GEN:  Well nourished, well developed in no acute distress HEENT: Normal NECK: No JVD; No carotid bruits LYMPHATICS: No lymphadenopathy CARDIAC: RRR, no murmurs, rubs, gallops RESPIRATORY:  Clear to auscultation without rales, wheezing or rhonchi  ABDOMEN: Soft, non-tender, non-distended MUSCULOSKELETAL:  No edema; No deformity  SKIN: Warm and dry NEUROLOGIC:  Alert and oriented x 3 PSYCHIATRIC:  Normal affect   ASSESSMENT:    No diagnosis found. PLAN:    In order of problems listed above:  Palpitations -PVCs by history.  They seem to improve after started taking spironolactone.  Perhaps this improved her potassium levels.  Beta-blocker nadolol seems to be helping as well.  Rheumatoid arthritis -On Arava.  Rheumatoid modulator.  Essential hypertension -Overall better control after visiting the hypertension clinic.  Medications  reviewed as above.  Excellent.  Losartan 100 spironolactone 25 nadolol 40.  Continue.  Reviewed her echocardiogram, actually does not have any evidence of mitral valve prolapse thankfully. -Very soft murmur on exam.  Recent labs showed potassium 3.5 creatinine 0.92 hemoglobin 11.5.  Shared Decision Making/Informed Consent        Medication Adjustments/Labs and Tests Ordered: Current medicines are reviewed at length with the patient today.  Concerns regarding medicines are outlined above.  No orders of the defined types were placed in this encounter.  No orders of the defined types were placed in this encounter.   Patient Instructions  Medication Instructions:  The current medical regimen is effective;  continue present plan and medications.  *If you need a refill on your cardiac medications before your next appointment, please call your pharmacy*  Follow-Up: At [facility], you and your health needs are our priority.  As part of our continuing mission to provide you with exceptional heart care, we have created designated Provider Care Teams.  These Care Teams include your primary Cardiologist (physician) and Advanced Practice Providers (APPs -  Physician Assistants and Nurse Practitioners) who all work together to provide you with the care you need, when you need it.  We recommend signing up for the patient portal called "MyChart".  Sign up information is provided on this After Visit Summary.  MyChart is used to connect with patients for Virtual Visits (Telemedicine).  Patients are able to view lab/test results, encounter notes, upcoming appointments, etc.  Non-urgent messages can be sent to your provider as well.   To learn more about what you can do with MyChart, go to [email or web address].    Your next appointment:   6 month(s)  The format for your next appointment:   In Person  Provider:   Candee Furbish, MD   Thank you for choosing [facility]!!         Signed, Candee Furbish, MD  05/19/2020 11:14 AM    [place]

## 2020-05-24 ENCOUNTER — Ambulatory Visit (HOSPITAL_COMMUNITY)
Admission: RE | Admit: 2020-05-24 | Discharge: 2020-05-24 | Disposition: A | Discharge status: Home / Self Care | Payer: PPO | Source: Ambulatory Visit | Attending: Rheumatology | Admitting: Rheumatology

## 2020-05-24 ENCOUNTER — Other Ambulatory Visit: Payer: Self-pay

## 2020-05-24 DIAGNOSIS — M8589 Other specified disorders of bone density and structure, multiple sites: Secondary | ICD-10-CM | POA: Diagnosis not present

## 2020-05-24 MED ORDER — DIPHENHYDRAMINE HCL 25 MG PO CAPS
25.0000 mg | ORAL_CAPSULE | Freq: Once | ORAL | Status: AC
  Administered 2020-05-24: 25 mg via ORAL

## 2020-05-24 MED ORDER — ZOLEDRONIC ACID 5 MG/100ML IV SOLN: 5.0000 mg | Freq: Once | INTRAVENOUS | Status: AC

## 2020-05-24 MED ORDER — ZOLEDRONIC ACID 5 MG/100ML IV SOLN
INTRAVENOUS | Status: AC
  Administered 2020-05-24: 5 mg via INTRAVENOUS
  Filled 2020-05-24: qty 100

## 2020-05-24 MED ORDER — ACETAMINOPHEN 325 MG PO TABS
ORAL_TABLET | ORAL | Status: AC
  Filled 2020-05-24: qty 2

## 2020-05-24 MED ORDER — ACETAMINOPHEN 325 MG PO TABS
650.0000 mg | ORAL_TABLET | Freq: Once | ORAL | Status: AC
  Administered 2020-05-24: 650 mg via ORAL

## 2020-05-24 MED ORDER — DIPHENHYDRAMINE HCL 25 MG PO CAPS
ORAL_CAPSULE | ORAL | Status: AC
  Filled 2020-05-24: qty 1

## 2020-05-24 NOTE — Discharge Instructions (Signed)

## 2020-05-29 ENCOUNTER — Other Ambulatory Visit: Payer: Self-pay | Admitting: Rheumatology

## 2020-05-29 DIAGNOSIS — K219 Gastro-esophageal reflux disease without esophagitis: Secondary | ICD-10-CM | POA: Diagnosis not present

## 2020-05-29 DIAGNOSIS — I10 Essential (primary) hypertension: Secondary | ICD-10-CM | POA: Diagnosis not present

## 2020-05-29 DIAGNOSIS — E039 Hypothyroidism, unspecified: Secondary | ICD-10-CM | POA: Diagnosis not present

## 2020-05-29 DIAGNOSIS — M0579 Rheumatoid arthritis with rheumatoid factor of multiple sites without organ or systems involvement: Secondary | ICD-10-CM

## 2020-05-29 DIAGNOSIS — E7800 Pure hypercholesterolemia, unspecified: Secondary | ICD-10-CM | POA: Diagnosis not present

## 2020-05-29 DIAGNOSIS — M069 Rheumatoid arthritis, unspecified: Secondary | ICD-10-CM | POA: Diagnosis not present

## 2020-05-29 MED ORDER — LEFLUNOMIDE 10 MG PO TABS: 10.0000 mg | ORAL_TABLET | Freq: Every day | ORAL | 0 refills | Status: DC

## 2020-05-29 NOTE — Telephone Encounter (Signed)
Last Visit: 05/15/2020 Next Visit: 10/17/2020 Labs: 04/25/2020 Mild anemia noted. LFTs are mildly elevated.   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

## 2020-05-29 NOTE — Telephone Encounter (Signed)
Patient needs refill on Arava sent to [facility]. Patient does want a 90 day supply, if possible.

## 2020-06-01 ENCOUNTER — Telehealth: Payer: Self-pay

## 2020-06-01 ENCOUNTER — Telehealth: Payer: Self-pay | Admitting: Pharmacist

## 2020-06-01 NOTE — Telephone Encounter (Signed)
Faxed coverage determination documentation to [employer]. Closing encounter and follow-up will occur in separate encounter.  Knox Saliva, PharmD, MPH Clinical Pharmacist (Rheumatology and Pulmonology)

## 2020-06-01 NOTE — Telephone Encounter (Addendum)
Submitted a Prior Authorization renewal request to [facility] for ENBREL via Coverage Determination Request faxed form. Will update once we receive a response.  EOC ID: [id number]  Phone: [phone] Fax: [phone]  Knox Saliva, PharmD, MPH Clinical Pharmacist (Rheumatology and Pulmonology)

## 2020-06-01 NOTE — Telephone Encounter (Signed)
[employer] called stating they received renewal request for patient's Enbrel and have a couple of questions: (1) Is medication for initial or continuation of therapy  (2) Diagnosis (3) Has patient been screened for latent tuberculosis infection.   Please call [phone] Option 3

## 2020-06-06 NOTE — Telephone Encounter (Addendum)
Received a fax from [employer] regarding an approval for ENBREL patient assistance from 07/01/20 through 06/30/21.  Sending original fax to scan center. Notified patient via [place].  Phone number: [phone] Fax: [phone]  Received notification regarding prior authorization through [place]. PA was denied. Pt will continue to receive Enbrel through PAP.  Knox Saliva, PharmD, MPH Clinical Pharmacist (Rheumatology and Pulmonology)

## 2020-06-08 ENCOUNTER — Other Ambulatory Visit: Payer: Self-pay | Admitting: Physician Assistant

## 2020-06-08 DIAGNOSIS — K219 Gastro-esophageal reflux disease without esophagitis: Secondary | ICD-10-CM | POA: Diagnosis not present

## 2020-06-08 DIAGNOSIS — E039 Hypothyroidism, unspecified: Secondary | ICD-10-CM | POA: Diagnosis not present

## 2020-06-08 DIAGNOSIS — M069 Rheumatoid arthritis, unspecified: Secondary | ICD-10-CM | POA: Diagnosis not present

## 2020-06-08 DIAGNOSIS — I10 Essential (primary) hypertension: Secondary | ICD-10-CM | POA: Diagnosis not present

## 2020-06-08 DIAGNOSIS — E7800 Pure hypercholesterolemia, unspecified: Secondary | ICD-10-CM | POA: Diagnosis not present

## 2020-06-08 NOTE — Telephone Encounter (Signed)
Last Visit: 05/15/2020 Next Visit: 10/17/2020  Current Dose per office note on 05/15/2020: dose not specified.  Last fill: 02/07/2020   Okay to refill gabapentin?

## 2020-06-13 NOTE — Telephone Encounter (Signed)
Received notification from [facility] regarding a prior authorization for ENBREL. Authorization has been APPROVED from 06/02/20 to 06/02/21. Will send to [employer] as requested for 2022 patient assistance program renewal.  Case # [id number] Phone # [phone]  Knox Saliva, PharmD, MPH Clinical Pharmacist (Rheumatology and Pulmonology)

## 2020-07-04 ENCOUNTER — Other Ambulatory Visit: Payer: Self-pay | Admitting: Cardiology

## 2020-07-05 ENCOUNTER — Other Ambulatory Visit: Payer: Self-pay

## 2020-07-05 MED ORDER — ENBREL SURECLICK 50 MG/ML ~~LOC~~ SOAJ
50.0000 mg | SUBCUTANEOUS | 0 refills | Status: DC
Start: 2020-07-05 — End: 2020-10-11

## 2020-07-05 NOTE — Telephone Encounter (Signed)
Last Visit: 05/15/2020 Next Visit: 10/17/2020 Labs: 04/25/2020, Mild anemia noted. LFTs are mildly elevated TB Gold: 11/09/2199 negative  Current Dose per office note 05/15/2020: Enbrel 50 mg subcutaneous injections every 7 days  OA:[id number] arthritis with rheumatoid factor of multiple sites without organ or systems involvement   Okay to refill Enbrel?

## 2020-07-05 NOTE — Telephone Encounter (Signed)
Patient called requesting prescription refill of Enbrel to be sent to [facility].   °

## 2020-07-07 DIAGNOSIS — I10 Essential (primary) hypertension: Secondary | ICD-10-CM | POA: Diagnosis not present

## 2020-07-07 DIAGNOSIS — M069 Rheumatoid arthritis, unspecified: Secondary | ICD-10-CM | POA: Diagnosis not present

## 2020-07-07 DIAGNOSIS — E7800 Pure hypercholesterolemia, unspecified: Secondary | ICD-10-CM | POA: Diagnosis not present

## 2020-07-07 DIAGNOSIS — K219 Gastro-esophageal reflux disease without esophagitis: Secondary | ICD-10-CM | POA: Diagnosis not present

## 2020-07-07 DIAGNOSIS — E039 Hypothyroidism, unspecified: Secondary | ICD-10-CM | POA: Diagnosis not present

## 2020-07-19 DIAGNOSIS — M25551 Pain in right hip: Secondary | ICD-10-CM | POA: Diagnosis not present

## 2020-07-20 ENCOUNTER — Other Ambulatory Visit: Payer: Self-pay | Admitting: *Deleted

## 2020-07-20 DIAGNOSIS — Z79899 Other long term (current) drug therapy: Secondary | ICD-10-CM | POA: Diagnosis not present

## 2020-07-21 LAB — CBC WITH DIFFERENTIAL/PLATELET
Absolute Monocytes: 183 cells/uL — ABNORMAL LOW (ref 200–950)
Basophils Absolute: 17 cells/uL (ref 0–200)
Basophils Relative: 0.2 %
Eosinophils Absolute: 0 cells/uL — ABNORMAL LOW (ref 15–500)
Eosinophils Relative: 0 %
HCT: 35 % (ref 35.0–45.0)
Hemoglobin: 12.2 g/dL (ref 11.7–15.5)
Lymphs Abs: 922 cells/uL (ref 850–3900)
MCH: 33 pg (ref 27.0–33.0)
MCHC: 34.9 g/dL (ref 32.0–36.0)
MCV: 94.6 fL (ref 80.0–100.0)
MPV: 10.4 fL (ref 7.5–12.5)
Monocytes Relative: 2.1 %
Neutro Abs: 7578 cells/uL (ref 1500–7800)
Neutrophils Relative %: 87.1 %
Platelets: 231 10*3/uL (ref 140–400)
RBC: 3.7 10*6/uL — ABNORMAL LOW (ref 3.80–5.10)
RDW: 12 % (ref 11.0–15.0)
Total Lymphocyte: 10.6 %
WBC: 8.7 10*3/uL (ref 3.8–10.8)

## 2020-07-21 LAB — COMPLETE METABOLIC PANEL WITH GFR
AG Ratio: 2.2 (calc) (ref 1.0–2.5)
ALT: 20 U/L (ref 6–29)
AST: 22 U/L (ref 10–35)
Albumin: 4.6 g/dL (ref 3.6–5.1)
Alkaline phosphatase (APISO): 60 U/L (ref 37–153)
BUN: 17 mg/dL (ref 7–25)
CO2: 25 mmol/L (ref 20–32)
Calcium: 9.3 mg/dL (ref 8.6–10.4)
Chloride: 107 mmol/L (ref 98–110)
Creat: 0.88 mg/dL (ref 0.60–0.93)
GFR, Est African American: 77 mL/min/{1.73_m2} (ref >=60)
GFR, Est Non African American: 66 mL/min/{1.73_m2} (ref >=60)
Globulin: 2.1 g/dL (calc) (ref 1.9–3.7)
Glucose, Bld: 121 mg/dL — ABNORMAL HIGH (ref 65–99)
Potassium: 4.1 mmol/L (ref 3.5–5.3)
Sodium: 141 mmol/L (ref 135–146)
Total Bilirubin: 0.7 mg/dL (ref 0.2–1.2)
Total Protein: 6.7 g/dL (ref 6.1–8.1)

## 2020-07-21 NOTE — Progress Notes (Signed)
Glucose is mildly elevated, probably not a fasting sample.  CBC is normal.

## 2020-07-27 DIAGNOSIS — M1611 Unilateral primary osteoarthritis, right hip: Secondary | ICD-10-CM | POA: Diagnosis not present

## 2020-08-08 DIAGNOSIS — H16223 Keratoconjunctivitis sicca, not specified as Sjogren's, bilateral: Secondary | ICD-10-CM | POA: Diagnosis not present

## 2020-08-08 DIAGNOSIS — Z961 Presence of intraocular lens: Secondary | ICD-10-CM | POA: Diagnosis not present

## 2020-08-08 DIAGNOSIS — H40013 Open angle with borderline findings, low risk, bilateral: Secondary | ICD-10-CM | POA: Diagnosis not present

## 2020-08-08 DIAGNOSIS — H18413 Arcus senilis, bilateral: Secondary | ICD-10-CM | POA: Diagnosis not present

## 2020-08-25 ENCOUNTER — Telehealth: Payer: Self-pay | Admitting: Cardiology

## 2020-08-25 DIAGNOSIS — Z808 Family history of malignant neoplasm of other organs or systems: Secondary | ICD-10-CM | POA: Diagnosis not present

## 2020-08-25 DIAGNOSIS — Z85828 Personal history of other malignant neoplasm of skin: Secondary | ICD-10-CM | POA: Diagnosis not present

## 2020-08-25 DIAGNOSIS — L853 Xerosis cutis: Secondary | ICD-10-CM | POA: Diagnosis not present

## 2020-08-25 DIAGNOSIS — L299 Pruritus, unspecified: Secondary | ICD-10-CM | POA: Diagnosis not present

## 2020-08-25 DIAGNOSIS — D225 Melanocytic nevi of trunk: Secondary | ICD-10-CM | POA: Diagnosis not present

## 2020-08-25 DIAGNOSIS — L821 Other seborrheic keratosis: Secondary | ICD-10-CM | POA: Diagnosis not present

## 2020-08-25 DIAGNOSIS — L578 Other skin changes due to chronic exposure to nonionizing radiation: Secondary | ICD-10-CM | POA: Diagnosis not present

## 2020-08-25 DIAGNOSIS — Z86018 Personal history of other benign neoplasm: Secondary | ICD-10-CM | POA: Diagnosis not present

## 2020-08-25 NOTE — Telephone Encounter (Signed)
Kari [name] is a pleasant 70 year old female with past medical history of palpitation/PVCs and hypertension.  Although she has family history of stroke and MI, she herself has never been diagnosed with any CAD. Last echocardiogram obtained on 11/08/2019 showed EF 60 to 65%, moderate LVH of basal-septal segment.  Last EKG obtained in April 2021 showed sinus rhythm with T wave inversion from V1 through V3.  Talking with the patient today, her hip issue is limiting her ability to climb stairs however she is still think she can climb at least 2 flight of stairs and walk 2 blocks away from her home and back without any issue.  Therefore, she is able to accomplish more than 4 METS of activity.  I will forward to Dr. Marlou Porch to make sure he is okay with the patient proceeding with surgery without further work-up.  Dr. Marlou Porch, please forward your response to P CV DIV PREOP

## 2020-08-25 NOTE — Telephone Encounter (Signed)
   [facility] Pre-operative Risk Assessment    HEARTCARE STAFF: - Please ensure there is not already an duplicate clearance open for this procedure. - Under Visit Info/Reason for Call, type in Other and utilize the format Clearance MM/DD/YY or Clearance TBD. Do not use dashes or single digits. - If request is for dental extraction, please clarify the # of teeth to be extracted.  Request for surgical clearance:  1. What type of surgery is being performed? Right hip replacement  2. When is this surgery scheduled? 09/22/2020   3. What type of clearance is required (medical clearance vs. Pharmacy clearance to hold med vs. Both)? both  4. Are there any medications that need to be held prior to surgery and how long? Not sure, leaving up cardiologist  5. Practice name and name of physician performing surgery? [facility], Dr. Frederik Pear   6. What is the office phone number? [phone]   7.   What is the office fax number? [phone]  8.   Anesthesia type (None, local, MAC, general) ? spinal   Selena Zobro 08/25/2020, 2:38 PM  _________________________________________________________________   (provider comments below)

## 2020-08-28 NOTE — Telephone Encounter (Signed)
   Primary Cardiologist: Candee Furbish, MD  Chart reviewed as part of pre-operative protocol coverage.  See notes from Dr. Marlou Porch.  Given past medical history and time since last visit, based on ACC/AHA guidelines, Kari [name] would be at acceptable risk for the planned procedure without further cardiovascular testing.   Please call with questions.  Richardson Dopp, PA-C 08/28/2020, 8:12 AM

## 2020-08-28 NOTE — Telephone Encounter (Signed)
OK to proceed Candee Furbish, MD

## 2020-08-28 NOTE — Telephone Encounter (Signed)
Notes faxed to surgeon. This phone note will be removed from the preop pool. Richardson Dopp, PA-C  08/28/2020 8:15 AM

## 2020-08-31 ENCOUNTER — Other Ambulatory Visit: Payer: Self-pay

## 2020-08-31 ENCOUNTER — Telehealth: Payer: Self-pay | Admitting: Rheumatology

## 2020-08-31 DIAGNOSIS — Z79899 Other long term (current) drug therapy: Secondary | ICD-10-CM | POA: Diagnosis not present

## 2020-08-31 DIAGNOSIS — M0579 Rheumatoid arthritis with rheumatoid factor of multiple sites without organ or systems involvement: Secondary | ICD-10-CM

## 2020-08-31 LAB — CBC WITH DIFFERENTIAL/PLATELET
Absolute Monocytes: 482 cells/uL (ref 200–950)
Basophils Absolute: 59 cells/uL (ref 0–200)
Basophils Relative: 1.3 %
Eosinophils Absolute: 149 cells/uL (ref 15–500)
Eosinophils Relative: 3.3 %
HCT: 32.8 % — ABNORMAL LOW (ref 35.0–45.0)
Hemoglobin: 11 g/dL — ABNORMAL LOW (ref 11.7–15.5)
Lymphs Abs: 1629 cells/uL (ref 850–3900)
MCH: 32.6 pg (ref 27.0–33.0)
MCHC: 33.5 g/dL (ref 32.0–36.0)
MCV: 97.3 fL (ref 80.0–100.0)
MPV: 9.9 fL (ref 7.5–12.5)
Monocytes Relative: 10.7 %
Neutro Abs: 2183 cells/uL (ref 1500–7800)
Neutrophils Relative %: 48.5 %
Platelets: 209 10*3/uL (ref 140–400)
RBC: 3.37 10*6/uL — ABNORMAL LOW (ref 3.80–5.10)
RDW: 12 % (ref 11.0–15.0)
Total Lymphocyte: 36.2 %
WBC: 4.5 10*3/uL (ref 3.8–10.8)

## 2020-08-31 LAB — COMPLETE METABOLIC PANEL WITH GFR
AG Ratio: 2.2 (calc) (ref 1.0–2.5)
ALT: 18 U/L (ref 6–29)
AST: 19 U/L (ref 10–35)
Albumin: 4.1 g/dL (ref 3.6–5.1)
Alkaline phosphatase (APISO): 60 U/L (ref 37–153)
BUN/Creatinine Ratio: 15 (calc) (ref 6–22)
BUN: 16 mg/dL (ref 7–25)
CO2: 27 mmol/L (ref 20–32)
Calcium: 8.7 mg/dL (ref 8.6–10.4)
Chloride: 107 mmol/L (ref 98–110)
Creat: 1.04 mg/dL — ABNORMAL HIGH (ref 0.60–0.93)
GFR, Est African American: 63 mL/min/{1.73_m2} (ref >=60)
GFR, Est Non African American: 54 mL/min/{1.73_m2} — ABNORMAL LOW (ref >=60)
Globulin: 1.9 g/dL (calc) (ref 1.9–3.7)
Glucose, Bld: 90 mg/dL (ref 65–99)
Potassium: 4.2 mmol/L (ref 3.5–5.3)
Sodium: 141 mmol/L (ref 135–146)
Total Bilirubin: 0.7 mg/dL (ref 0.2–1.2)
Total Protein: 6 g/dL — ABNORMAL LOW (ref 6.1–8.1)

## 2020-08-31 MED ORDER — LEFLUNOMIDE 10 MG PO TABS: 10.0000 mg | ORAL_TABLET | Freq: Every day | ORAL | 0 refills | Status: DC

## 2020-08-31 NOTE — Telephone Encounter (Signed)
Last Visit: 05/15/2020 Next Visit: 10/17/2020 Labs: 07/20/2020 Glucose is mildly elevated, probably not a fasting sample. CBC is normal.  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 Estanislado Pandy

## 2020-08-31 NOTE — Telephone Encounter (Signed)
Patient here for lab draw today, but requesting a refill on Arava.

## 2020-09-01 NOTE — Progress Notes (Signed)
Anemia noted.  Creatinine is mildly elevated.  Most likely due to the use of diuretics.  Please forward labs to her PCP.  Please advise patient to take multivitamin with iron.

## 2020-09-04 DIAGNOSIS — M1611 Unilateral primary osteoarthritis, right hip: Secondary | ICD-10-CM | POA: Diagnosis not present

## 2020-09-04 DIAGNOSIS — M069 Rheumatoid arthritis, unspecified: Secondary | ICD-10-CM | POA: Diagnosis not present

## 2020-09-04 DIAGNOSIS — M797 Fibromyalgia: Secondary | ICD-10-CM | POA: Diagnosis not present

## 2020-09-04 DIAGNOSIS — I10 Essential (primary) hypertension: Secondary | ICD-10-CM | POA: Diagnosis not present

## 2020-09-04 DIAGNOSIS — E039 Hypothyroidism, unspecified: Secondary | ICD-10-CM | POA: Diagnosis not present

## 2020-09-04 DIAGNOSIS — Z01818 Encounter for other preprocedural examination: Secondary | ICD-10-CM | POA: Diagnosis not present

## 2020-09-08 DIAGNOSIS — Z1231 Encounter for screening mammogram for malignant neoplasm of breast: Secondary | ICD-10-CM | POA: Diagnosis not present

## 2020-09-13 DIAGNOSIS — M6281 Muscle weakness (generalized): Secondary | ICD-10-CM | POA: Diagnosis not present

## 2020-09-13 DIAGNOSIS — M1611 Unilateral primary osteoarthritis, right hip: Secondary | ICD-10-CM | POA: Diagnosis not present

## 2020-09-22 DIAGNOSIS — M1611 Unilateral primary osteoarthritis, right hip: Secondary | ICD-10-CM | POA: Diagnosis not present

## 2020-09-22 HISTORY — PX: TOTAL HIP ARTHROPLASTY: SHX124

## 2020-09-25 DIAGNOSIS — M6281 Muscle weakness (generalized): Secondary | ICD-10-CM | POA: Diagnosis not present

## 2020-09-25 DIAGNOSIS — Z96641 Presence of right artificial hip joint: Secondary | ICD-10-CM | POA: Diagnosis not present

## 2020-09-25 DIAGNOSIS — R262 Difficulty in walking, not elsewhere classified: Secondary | ICD-10-CM | POA: Diagnosis not present

## 2020-09-27 DIAGNOSIS — M6281 Muscle weakness (generalized): Secondary | ICD-10-CM | POA: Diagnosis not present

## 2020-09-27 DIAGNOSIS — Z96641 Presence of right artificial hip joint: Secondary | ICD-10-CM | POA: Diagnosis not present

## 2020-09-27 DIAGNOSIS — R262 Difficulty in walking, not elsewhere classified: Secondary | ICD-10-CM | POA: Diagnosis not present

## 2020-09-29 ENCOUNTER — Other Ambulatory Visit: Payer: Self-pay | Admitting: Cardiology

## 2020-10-03 ENCOUNTER — Other Ambulatory Visit: Payer: Self-pay | Admitting: Rheumatology

## 2020-10-03 DIAGNOSIS — Z471 Aftercare following joint replacement surgery: Secondary | ICD-10-CM | POA: Diagnosis not present

## 2020-10-03 DIAGNOSIS — M0579 Rheumatoid arthritis with rheumatoid factor of multiple sites without organ or systems involvement: Secondary | ICD-10-CM

## 2020-10-03 DIAGNOSIS — Z96641 Presence of right artificial hip joint: Secondary | ICD-10-CM | POA: Diagnosis not present

## 2020-10-03 NOTE — Telephone Encounter (Signed)
It appears that both refills were refused.  Does she want a refill of only gabapentin?

## 2020-10-03 NOTE — Progress Notes (Signed)
Office Visit Note  Patient: Kari [name]             Date of Birth: Apr 16, 1951           MRN: [id number]             PCP: Aretta Nip, MD Referring: Aretta Nip, MD Visit Date: 10/17/2020 Occupation: @GUAROCC @  Subjective:  Left hip pain.   History of Present Illness: Kari [name] is a 70 y.o. female with a history of seropositive rheumatoid arthritis, osteoarthritis and osteoporosis.  She states she underwent right total hip replacement on September 23, 2014 by Dr. Mayer Camel.  She is going to physical therapy twice a week and having a good recovery.  Her left hip joint is doing well.  She has not noticed flare of rheumatoid arthritis.  She has been taking her medications on a regular basis.  She continues to have some joint stiffness.  She is having difficulty climbing stairs and getting up from the chair.  She gets stiff quite easily.  Activities of Daily Living:  Patient reports morning stiffness for all day. Patient Reports nocturnal pain.  Difficulty dressing/grooming: Reports Difficulty climbing stairs: Reports Difficulty getting out of chair: Reports Difficulty using hands for taps, buttons, cutlery, and/or writing: Denies  Review of Systems  Constitutional: Positive for fatigue. Negative for night sweats, weight gain and weight loss.  HENT: Negative for mouth sores, trouble swallowing, trouble swallowing, mouth dryness and nose dryness.   Eyes: Positive for dryness. Negative for pain, redness, itching and visual disturbance.  Respiratory: Negative for cough, shortness of breath and difficulty breathing.   Cardiovascular: Negative for chest pain, palpitations, hypertension, irregular heartbeat and swelling in legs/feet.  Gastrointestinal: Negative for blood in stool, constipation and diarrhea.  Endocrine: Negative for increased urination.  Genitourinary: Negative for difficulty urinating and vaginal dryness.  Musculoskeletal: Positive for arthralgias, joint pain,  myalgias, morning stiffness, muscle tenderness and myalgias. Negative for joint swelling and muscle weakness.  Skin: Negative for color change, rash, hair loss, redness, skin tightness, ulcers and sensitivity to sunlight.  Allergic/Immunologic: Negative for susceptible to infections.  Neurological: Negative for dizziness, numbness, headaches, memory loss, night sweats and weakness.  Hematological: Negative for bruising/bleeding tendency and swollen glands.  Psychiatric/Behavioral: Negative for depressed mood, confusion and sleep disturbance. The patient is not nervous/anxious.     PMFS History:  Patient Active Problem List   Diagnosis Date Noted  . Rheumatoid arthritis with rheumatoid factor of multiple sites without organ or systems involvement ([place]) 10/17/2020  . High risk medication use 10/17/2020  . History of total replacement of both hip joints 10/17/2020  . DDD (degenerative disc disease), lumbar 10/17/2020  . Myofascial pain syndrome 10/17/2020  . Age-related osteoporosis without current pathological fracture 10/17/2020  . Frequent falls 10/17/2020  . Hyperlipidemia 07/14/2015  . Palpitation 05/11/2014  . PVC (premature ventricular contraction) 05/11/2014  . Hypokalemia 05/11/2014  . Essential hypertension 05/11/2014  . Mitral regurgitation 05/11/2014  . Arthritis, hip 02/03/2013    Past Medical History:  Diagnosis Date  . Arthritis   . Complication of anesthesia    when block attempted ax -turned red in preop-surg delayed  . Fibromyalgia   . GERD (gastroesophageal reflux disease)   . Hypertension   . Hypothyroidism   . MVP (mitral valve prolapse)   . Osteoporosis   . 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 Heart attack Father   .  Heart disease Mother   . Stroke Mother   . Colon cancer Other   . Colon polyps Other   . Hypothyroidism Son    Past Surgical History:  Procedure Laterality Date  . ABDOMINAL  HYSTERECTOMY    . CESAREAN SECTION    . CHOLECYSTECTOMY    . COLONOSCOPY    . JOINT REPLACEMENT    . KNEE ARTHROSCOPY Right 2/11  . TOTAL HIP ARTHROPLASTY Left 02/03/2013   Procedure: TOTAL HIP ARTHROPLASTY;  Surgeon: Kerin Salen, MD;  Location: [facility];  Service: Orthopedics;  Laterality: Left;  . TOTAL HIP ARTHROPLASTY Right 09/22/2020   Dr. Mayer Camel  . ULNAR NERVE TRANSPOSITION  09/17/2011   Procedure: ULNAR NERVE DECOMPRESSION/TRANSPOSITION;  Surgeon: Cammie Sickle., MD;  Location: [facility];  Service: Orthopedics;  Laterality: Left;  decompression  . WRIST ARTHROSCOPY Right    x2-fusion    . WRIST ARTHROSCOPY Left    Social History   Social History Narrative  . Not on file   Immunization History  Administered Date(s) Administered  . PFIZER(Purple Top)SARS-COV-2 Vaccination 08/05/2019, 08/26/2019, 02/14/2020     Objective: Vital Signs: BP 123/79 (BP Location: Left Arm, Patient Position: Sitting, Cuff Size: Normal)   Pulse 66   Resp 15   Ht 5' 2.5" (1.588 m)   Wt 190 lb 3.2 oz (86.3 kg)   BMI 34.23 kg/m    Physical Exam Vitals and nursing note reviewed.  Constitutional:      Appearance: She is well-developed.  HENT:     Head: Normocephalic and atraumatic.  Eyes:     Conjunctiva/sclera: Conjunctivae normal.  Cardiovascular:     Rate and Rhythm: Normal rate and regular rhythm.     Heart sounds: Normal heart sounds.  Pulmonary:     Effort: Pulmonary effort is normal.     Breath sounds: Normal breath sounds.  Abdominal:     General: Bowel sounds are normal.     Palpations: Abdomen is soft.  Musculoskeletal:     Cervical back: Normal range of motion.  Lymphadenopathy:     Cervical: No cervical adenopathy.  Skin:    General: Skin is warm and dry.     Capillary Refill: Capillary refill takes less than 2 seconds.  Neurological:     Mental Status: She is alert and oriented to person, place, and time.  Psychiatric:        Behavior: Behavior  normal.      Musculoskeletal Exam: C-spine was in good range of motion.  She had no thoracic or lumbar tenderness.  Shoulder joints, and elbow joints with good range of motion.  Her right wrist joint is fused without synovitis.  She had no synovitis over left wrist joint or MCPs or PIPs.  Both hip joints with good range of motion.  Right hip joint range of motion was less compared to the left.  Knee joints in good range of motion.  There was no tenderness over ankles or MTPs.  CDAI Exam: CDAI Score: 0.6  Patient Global: 4 mm; Provider Global: 2 mm Swollen: 0 ; Tender: 1  Joint Exam 10/17/2020      Right  Left  Hip   Tender        Investigation: No additional findings.  Imaging: No results found.  Recent Labs: Lab Results  Component Value Date   WBC 4.5 08/31/2020   HGB 11.0 (L) 08/31/2020   PLT 209 08/31/2020   NA 141 08/31/2020   K 4.2 08/31/2020  CL 107 08/31/2020   CO2 27 08/31/2020   GLUCOSE 90 08/31/2020   BUN 16 08/31/2020   CREATININE 1.04 (H) 08/31/2020   BILITOT 0.7 08/31/2020   ALKPHOS 72 01/20/2017   AST 19 08/31/2020   ALT 18 08/31/2020   PROT 6.0 (L) 08/31/2020   ALBUMIN 4.2 01/20/2017   CALCIUM 8.7 08/31/2020   GFRAA 63 08/31/2020   QFTBGOLDPLUS NEGATIVE 11/10/2019    Speciality Comments: Recieves Enbrel through [employer] Program  Procedures:  No procedures performed Allergies: Humira [adalimumab], Clinoril [sulindac], Erythromycin, Hydrocodone, Ibuprofen, Penicillins, Plaquenil [hydroxychloroquine], Sulfa antibiotics, Trimethoprim, and Vancomycin cross reactors   Assessment / Plan:     Visit Diagnoses: Rheumatoid arthritis with rheumatoid factor of multiple sites without organ or systems involvement ([place]) - +RF, +ANA, erosive disease: She is clinically doing well without any synovitis.  She had no flare after the right hip replacement.  She has been tolerating her Rehbein Enbrel well.  High risk medication use - Arava 10 mg 1 tablet daily  and Enbrel SureClick 50 mg every 7 days.   Previously on MTX 0.6 ml sq inj q7d-D/c due to nausea.  Her labs from August 31, 2020 were within normal limits except for mild elevation of creatinine.  We will continue to monitor.  TB gold is due in May 2022 which she can get with her next labs.  She is fully vaccinated against COVID-19.  A booster dose was recommended per ACR guidelines.  She was also advised to get Shingrix vaccine.  She states she is up-to-date on pneumococcal vaccine.  Medial epicondylitis of both elbows-she has noticed improvement in her epicondylitis since she has been doing stretches.  History of total replacement of both hip joints - RTHR 09/22/20 by Dr. Mayer Camel, [facility] 2014.  She is recovering well from the right total hip replacement.  She is going to physical therapy twice a week.  She had fairly good range of motion bilateral hip joints.  DDD (degenerative disc disease), lumbar-she has been having increased discomfort since she had replacement of her right hip joint.  Myofascial pain syndrome-she continues to have some generalized pain and stiffness.  Age-related osteoporosis without current pathological fracture - DEXA 02/21/20: Right total hip BMD 0.703 eiyh T-score -2.0. previously treated with fosamax and forteo (completed 2 years of treatment). Reclast IV on11/24/2021.  Frequent falls-most likely related to the hip joint osteoarthritis.  She has noticed decrease falls since the hip replacement.  History of hypertension-her blood pressure is normal today.  History of hyperlipidemia-increased risk of heart disease with rheumatoid arthritis was discussed.  Dietary modifications and exercise recommendations were placed in the AVS.  History of hypothyroidism  History of neuropathy  Vitamin D deficiency  Orders: No orders of the defined types were placed in this encounter.  No orders of the defined types were placed in this encounter.     Follow-Up Instructions: Return  in about 5 months (around 03/19/2021) for Rheumatoid arthritis, Osteoarthritis, Osteoporosis.   Bo Merino, MD  Note - This record has been created using Editor, commissioning.  Chart creation errors have been sought, but may not always  have been located. Such creation errors do not reflect on  the standard of medical care.

## 2020-10-03 NOTE — Telephone Encounter (Signed)
Next Visit: 10/17/2020  Last Visit: 05/15/2020  Last Fill: 06/08/2020  DX: : Rheumatoid arthritis with rheumatoid factor of multiple sites without organ or systems involvement,  Myofascial pain syndrome  Current Dose per office note 05/15/2020,  gabapentin not mentioned  Labs: 08/31/2020, Anemia noted. Creatinine is mildly elevated. Most likely due to the use of diuretics. Please forward labs to her PCP. Please advise patient to take multivitamin with iron.  Okay to refill Gabapentin?

## 2020-10-04 DIAGNOSIS — Z96641 Presence of right artificial hip joint: Secondary | ICD-10-CM | POA: Diagnosis not present

## 2020-10-04 DIAGNOSIS — R262 Difficulty in walking, not elsewhere classified: Secondary | ICD-10-CM | POA: Diagnosis not present

## 2020-10-04 DIAGNOSIS — M6281 Muscle weakness (generalized): Secondary | ICD-10-CM | POA: Diagnosis not present

## 2020-10-06 DIAGNOSIS — I10 Essential (primary) hypertension: Secondary | ICD-10-CM | POA: Diagnosis not present

## 2020-10-06 DIAGNOSIS — E039 Hypothyroidism, unspecified: Secondary | ICD-10-CM | POA: Diagnosis not present

## 2020-10-06 DIAGNOSIS — R262 Difficulty in walking, not elsewhere classified: Secondary | ICD-10-CM | POA: Diagnosis not present

## 2020-10-06 DIAGNOSIS — M069 Rheumatoid arthritis, unspecified: Secondary | ICD-10-CM | POA: Diagnosis not present

## 2020-10-06 DIAGNOSIS — Z96641 Presence of right artificial hip joint: Secondary | ICD-10-CM | POA: Diagnosis not present

## 2020-10-06 DIAGNOSIS — K219 Gastro-esophageal reflux disease without esophagitis: Secondary | ICD-10-CM | POA: Diagnosis not present

## 2020-10-06 DIAGNOSIS — M1611 Unilateral primary osteoarthritis, right hip: Secondary | ICD-10-CM | POA: Diagnosis not present

## 2020-10-06 DIAGNOSIS — M6281 Muscle weakness (generalized): Secondary | ICD-10-CM | POA: Diagnosis not present

## 2020-10-06 DIAGNOSIS — E7800 Pure hypercholesterolemia, unspecified: Secondary | ICD-10-CM | POA: Diagnosis not present

## 2020-10-10 ENCOUNTER — Other Ambulatory Visit: Payer: Self-pay | Admitting: Rheumatology

## 2020-10-10 DIAGNOSIS — M0579 Rheumatoid arthritis with rheumatoid factor of multiple sites without organ or systems involvement: Secondary | ICD-10-CM

## 2020-10-10 MED ORDER — GABAPENTIN 300 MG PO CAPS
ORAL_CAPSULE | ORAL | 0 refills | Status: DC
Start: 2020-10-10 — End: 2021-01-09

## 2020-10-10 NOTE — Telephone Encounter (Signed)
  Next Visit: 10/17/2020  Last Visit: 05/15/2020  Last Fill: 06/08/2020  DX: Rheumatoid arthritis with rheumatoid factor of multiple sites without organ or systems involvement   Current Dose per office note 05/15/2020, not mentioned  Labs: 08/31/2020, Anemia noted. Creatinine is mildly elevated. Most likely due to the use of diuretics. Please forward labs to her PCP. Please advise patient to take multivitamin with iron.  Okay to refill Gabapentin?

## 2020-10-11 ENCOUNTER — Other Ambulatory Visit: Payer: Self-pay

## 2020-10-11 DIAGNOSIS — M6281 Muscle weakness (generalized): Secondary | ICD-10-CM | POA: Diagnosis not present

## 2020-10-11 DIAGNOSIS — R262 Difficulty in walking, not elsewhere classified: Secondary | ICD-10-CM | POA: Diagnosis not present

## 2020-10-11 DIAGNOSIS — Z96641 Presence of right artificial hip joint: Secondary | ICD-10-CM | POA: Diagnosis not present

## 2020-10-11 MED ORDER — ENBREL SURECLICK 50 MG/ML ~~LOC~~ SOAJ
50.0000 mg | SUBCUTANEOUS | 0 refills | Status: DC
Start: 2020-10-11 — End: 2021-01-03

## 2020-10-11 NOTE — Telephone Encounter (Signed)
Patient called requesting prescription refill of Enbrel to be sent to [facility].   °

## 2020-10-11 NOTE — Telephone Encounter (Signed)
Next Visit: 10/17/2020  Last Visit: 05/15/2020   Last Fill: 07/05/2020  DX: Rheumatoid arthritis with rheumatoid factor of multiple sites without organ or systems involvement  Current Dose per office note on 62/86/3817: Enbrel SureClick 50 mg every 7 days.    Labs: 08/31/2020 Anemia noted. Creatinine is mildly elevated. Most likely due to the use of diuretics.  TB Gold: 11/10/2019 negative   Okay to refill enbrel?

## 2020-10-11 NOTE — Telephone Encounter (Signed)
Called patient and advised her that Lovena Le has sent enbrel refill and she can call [employer] to schedule delivery. Patient verbalized understanding.

## 2020-10-16 DIAGNOSIS — M6281 Muscle weakness (generalized): Secondary | ICD-10-CM | POA: Diagnosis not present

## 2020-10-16 DIAGNOSIS — R262 Difficulty in walking, not elsewhere classified: Secondary | ICD-10-CM | POA: Diagnosis not present

## 2020-10-16 DIAGNOSIS — Z96641 Presence of right artificial hip joint: Secondary | ICD-10-CM | POA: Diagnosis not present

## 2020-10-17 ENCOUNTER — Ambulatory Visit: Payer: PPO | Admitting: Rheumatology

## 2020-10-17 ENCOUNTER — Encounter: Payer: Self-pay | Admitting: Rheumatology

## 2020-10-17 ENCOUNTER — Other Ambulatory Visit: Payer: Self-pay

## 2020-10-17 VITALS — BP 123/79 | HR 66 | Resp 15 | Ht 62.5 in | Wt 190.2 lb

## 2020-10-17 DIAGNOSIS — R202 Paresthesia of skin: Secondary | ICD-10-CM

## 2020-10-17 DIAGNOSIS — M0579 Rheumatoid arthritis with rheumatoid factor of multiple sites without organ or systems involvement: Secondary | ICD-10-CM | POA: Diagnosis not present

## 2020-10-17 DIAGNOSIS — Z8669 Personal history of other diseases of the nervous system and sense organs: Secondary | ICD-10-CM | POA: Diagnosis not present

## 2020-10-17 DIAGNOSIS — M7701 Medial epicondylitis, right elbow: Secondary | ICD-10-CM

## 2020-10-17 DIAGNOSIS — Z79899 Other long term (current) drug therapy: Secondary | ICD-10-CM | POA: Diagnosis not present

## 2020-10-17 DIAGNOSIS — M7918 Myalgia, other site: Secondary | ICD-10-CM | POA: Diagnosis not present

## 2020-10-17 DIAGNOSIS — E559 Vitamin D deficiency, unspecified: Secondary | ICD-10-CM | POA: Diagnosis not present

## 2020-10-17 DIAGNOSIS — R296 Repeated falls: Secondary | ICD-10-CM | POA: Diagnosis not present

## 2020-10-17 DIAGNOSIS — M810 Age-related osteoporosis without current pathological fracture: Secondary | ICD-10-CM | POA: Diagnosis not present

## 2020-10-17 DIAGNOSIS — Z96643 Presence of artificial hip joint, bilateral: Secondary | ICD-10-CM | POA: Insufficient documentation

## 2020-10-17 DIAGNOSIS — M7702 Medial epicondylitis, left elbow: Secondary | ICD-10-CM

## 2020-10-17 DIAGNOSIS — Z8679 Personal history of other diseases of the circulatory system: Secondary | ICD-10-CM | POA: Diagnosis not present

## 2020-10-17 DIAGNOSIS — M8589 Other specified disorders of bone density and structure, multiple sites: Secondary | ICD-10-CM

## 2020-10-17 DIAGNOSIS — Z8639 Personal history of other endocrine, nutritional and metabolic disease: Secondary | ICD-10-CM | POA: Diagnosis not present

## 2020-10-17 DIAGNOSIS — M5136 Other intervertebral disc degeneration, lumbar region: Secondary | ICD-10-CM | POA: Insufficient documentation

## 2020-10-17 DIAGNOSIS — Z96642 Presence of left artificial hip joint: Secondary | ICD-10-CM

## 2020-10-17 NOTE — Patient Instructions (Signed)
Standing Labs We placed an order today for your standing lab work.   Please have your standing labs drawn in June and every 3 months  If possible, please have your labs drawn 2 weeks prior to your appointment so that the provider can discuss your results at your appointment.  We have open lab daily Monday through Thursday from 1:30-4:30 PM and Friday from 1:30-4:00 PM at the office of Dr. Bo Merino, [facility].   Please be advised, all patients with office appointments requiring lab work will take precedents over walk-in lab work.  If possible, please come for your lab work on Monday and Friday afternoons, as you may experience shorter wait times. The office is located at [address], [place], [place], [place] [address] No appointment is necessary.   Labs are drawn by [facility]. Please bring your co-pay at the time of your lab draw.  You may receive a bill from [facility] for your lab work.  If you wish to have your labs drawn at another location, please call the office 24 hours in advance to send orders.  If you have any questions regarding directions or hours of operation,  please call [phone].   As a reminder, please drink plenty of water prior to coming for your lab work. Thanks!  Vaccines You are taking a medication(s) that can suppress your immune system.  The following immunizations are recommended: . Flu annually . Covid-19  . Pneumonia (Pneumovax 23 and Prevnar 13 spaced at least 1 year apart) . Shingrix (after age 42)  Please check with your PCP to make sure you are up to date.   Heart Disease Prevention   Your inflammatory disease increases your risk of heart disease which includes heart attack, stroke, atrial fibrillation (irregular heartbeats), high blood pressure, heart failure and atherosclerosis (plaque in the arteries).  It is important to reduce your risk by:   . Keep blood pressure, cholesterol, and blood sugar at healthy levels    . Smoking Cessation   . Maintain a healthy weight  o BMI 20-25   . Eat a healthy diet  o Plenty of fresh fruit, vegetables, and whole grains  o Limit saturated fats, foods high in sodium, and added sugars  o DASH and Mediterranean diet   . Increase physical activity  o Recommend moderate physically activity for 150 minutes per week/ 30 minutes a day for five days a week These can be broken up into three separate ten-minute sessions during the day.   . Reduce Stress  . Meditation, slow breathing exercises, yoga, coloring books  . Dental visits twice a year

## 2020-10-18 DIAGNOSIS — Z96641 Presence of right artificial hip joint: Secondary | ICD-10-CM | POA: Diagnosis not present

## 2020-10-18 DIAGNOSIS — R262 Difficulty in walking, not elsewhere classified: Secondary | ICD-10-CM | POA: Diagnosis not present

## 2020-10-18 DIAGNOSIS — M6281 Muscle weakness (generalized): Secondary | ICD-10-CM | POA: Diagnosis not present

## 2020-10-23 DIAGNOSIS — M6281 Muscle weakness (generalized): Secondary | ICD-10-CM | POA: Diagnosis not present

## 2020-10-23 DIAGNOSIS — Z96641 Presence of right artificial hip joint: Secondary | ICD-10-CM | POA: Diagnosis not present

## 2020-10-23 DIAGNOSIS — R262 Difficulty in walking, not elsewhere classified: Secondary | ICD-10-CM | POA: Diagnosis not present

## 2020-10-25 DIAGNOSIS — Z96641 Presence of right artificial hip joint: Secondary | ICD-10-CM | POA: Diagnosis not present

## 2020-10-25 DIAGNOSIS — M6281 Muscle weakness (generalized): Secondary | ICD-10-CM | POA: Diagnosis not present

## 2020-10-25 DIAGNOSIS — R262 Difficulty in walking, not elsewhere classified: Secondary | ICD-10-CM | POA: Diagnosis not present

## 2020-10-30 DIAGNOSIS — R262 Difficulty in walking, not elsewhere classified: Secondary | ICD-10-CM | POA: Diagnosis not present

## 2020-10-30 DIAGNOSIS — M6281 Muscle weakness (generalized): Secondary | ICD-10-CM | POA: Diagnosis not present

## 2020-10-30 DIAGNOSIS — Z96641 Presence of right artificial hip joint: Secondary | ICD-10-CM | POA: Diagnosis not present

## 2020-10-31 DIAGNOSIS — Z96641 Presence of right artificial hip joint: Secondary | ICD-10-CM | POA: Diagnosis not present

## 2020-10-31 DIAGNOSIS — Z471 Aftercare following joint replacement surgery: Secondary | ICD-10-CM | POA: Diagnosis not present

## 2020-11-01 DIAGNOSIS — Z96641 Presence of right artificial hip joint: Secondary | ICD-10-CM | POA: Diagnosis not present

## 2020-11-01 DIAGNOSIS — M6281 Muscle weakness (generalized): Secondary | ICD-10-CM | POA: Diagnosis not present

## 2020-11-01 DIAGNOSIS — R262 Difficulty in walking, not elsewhere classified: Secondary | ICD-10-CM | POA: Diagnosis not present

## 2020-11-06 DIAGNOSIS — M6281 Muscle weakness (generalized): Secondary | ICD-10-CM | POA: Diagnosis not present

## 2020-11-06 DIAGNOSIS — Z96641 Presence of right artificial hip joint: Secondary | ICD-10-CM | POA: Diagnosis not present

## 2020-11-06 DIAGNOSIS — R262 Difficulty in walking, not elsewhere classified: Secondary | ICD-10-CM | POA: Diagnosis not present

## 2020-11-07 DIAGNOSIS — M6281 Muscle weakness (generalized): Secondary | ICD-10-CM | POA: Diagnosis not present

## 2020-11-07 DIAGNOSIS — R262 Difficulty in walking, not elsewhere classified: Secondary | ICD-10-CM | POA: Diagnosis not present

## 2020-11-07 DIAGNOSIS — Z96641 Presence of right artificial hip joint: Secondary | ICD-10-CM | POA: Diagnosis not present

## 2020-11-08 DIAGNOSIS — Z96641 Presence of right artificial hip joint: Secondary | ICD-10-CM | POA: Diagnosis not present

## 2020-11-08 DIAGNOSIS — R262 Difficulty in walking, not elsewhere classified: Secondary | ICD-10-CM | POA: Diagnosis not present

## 2020-11-08 DIAGNOSIS — M6281 Muscle weakness (generalized): Secondary | ICD-10-CM | POA: Diagnosis not present

## 2020-11-13 ENCOUNTER — Telehealth: Payer: Self-pay

## 2020-11-13 DIAGNOSIS — Z96641 Presence of right artificial hip joint: Secondary | ICD-10-CM | POA: Diagnosis not present

## 2020-11-13 DIAGNOSIS — R262 Difficulty in walking, not elsewhere classified: Secondary | ICD-10-CM | POA: Diagnosis not present

## 2020-11-13 DIAGNOSIS — R2689 Other abnormalities of gait and mobility: Secondary | ICD-10-CM

## 2020-11-13 DIAGNOSIS — M6281 Muscle weakness (generalized): Secondary | ICD-10-CM | POA: Diagnosis not present

## 2020-11-13 NOTE — Telephone Encounter (Signed)
Okay to place requested referral for balance/fall prevention?

## 2020-11-13 NOTE — Telephone Encounter (Signed)
yes

## 2020-11-13 NOTE — Telephone Encounter (Signed)
Patient called stating she is currently having physical therapy with Lexine Baton at [facility] for her hip replacement.  Patient states she fell 2 times over the weekend and is requesting a new referral for balance.  Patient states Dr. Estanislado Pandy referred her to PT for balance in 2020 and asked if she could send a new order to [facility].

## 2020-11-13 NOTE — Telephone Encounter (Signed)
Referral placed and faxed. I called patient and advised.

## 2020-11-15 DIAGNOSIS — Z471 Aftercare following joint replacement surgery: Secondary | ICD-10-CM | POA: Diagnosis not present

## 2020-11-15 DIAGNOSIS — Z96641 Presence of right artificial hip joint: Secondary | ICD-10-CM | POA: Diagnosis not present

## 2020-11-15 DIAGNOSIS — R262 Difficulty in walking, not elsewhere classified: Secondary | ICD-10-CM | POA: Diagnosis not present

## 2020-11-15 DIAGNOSIS — M6281 Muscle weakness (generalized): Secondary | ICD-10-CM | POA: Diagnosis not present

## 2020-11-21 DIAGNOSIS — Z9181 History of falling: Secondary | ICD-10-CM | POA: Diagnosis not present

## 2020-11-21 DIAGNOSIS — R2689 Other abnormalities of gait and mobility: Secondary | ICD-10-CM | POA: Diagnosis not present

## 2020-11-29 DIAGNOSIS — Z9181 History of falling: Secondary | ICD-10-CM | POA: Diagnosis not present

## 2020-11-29 DIAGNOSIS — R2689 Other abnormalities of gait and mobility: Secondary | ICD-10-CM | POA: Diagnosis not present

## 2020-12-01 ENCOUNTER — Other Ambulatory Visit: Payer: Self-pay | Admitting: Rheumatology

## 2020-12-01 DIAGNOSIS — M0579 Rheumatoid arthritis with rheumatoid factor of multiple sites without organ or systems involvement: Secondary | ICD-10-CM

## 2020-12-01 DIAGNOSIS — Z9181 History of falling: Secondary | ICD-10-CM | POA: Diagnosis not present

## 2020-12-01 DIAGNOSIS — R2689 Other abnormalities of gait and mobility: Secondary | ICD-10-CM | POA: Diagnosis not present

## 2020-12-01 NOTE — Telephone Encounter (Signed)
Next Visit: 03/20/2021  Last Visit: 10/17/2020  Last Fill: 08/31/2020  DX: Rheumatoid arthritis with rheumatoid factor of multiple sites without organ or systems involvement   Current Dose per office note on 10/17/2020: Arava 10 mg 1 tablet daily   Labs: 08/31/2020 Anemia noted. Creatinine is mildly elevated. Most likely due to the use of diuretics.  Advised patient that she is due to update labs and patient verbalized understanding. Patient states she will come on 12/04/2020 to update labs. Provided patient with lab hours. Lab hours are in place.   Okay to refill arava?

## 2020-12-04 ENCOUNTER — Other Ambulatory Visit: Payer: Self-pay | Admitting: *Deleted

## 2020-12-04 ENCOUNTER — Other Ambulatory Visit: Payer: Self-pay

## 2020-12-04 DIAGNOSIS — Z79899 Other long term (current) drug therapy: Secondary | ICD-10-CM

## 2020-12-06 DIAGNOSIS — Z9181 History of falling: Secondary | ICD-10-CM | POA: Diagnosis not present

## 2020-12-06 DIAGNOSIS — R2689 Other abnormalities of gait and mobility: Secondary | ICD-10-CM | POA: Diagnosis not present

## 2020-12-06 LAB — COMPLETE METABOLIC PANEL WITH GFR
AG Ratio: 2.2 (calc) (ref 1.0–2.5)
ALT: 17 U/L (ref 6–29)
AST: 21 U/L (ref 10–35)
Albumin: 4.4 g/dL (ref 3.6–5.1)
Alkaline phosphatase (APISO): 72 U/L (ref 37–153)
BUN: 15 mg/dL (ref 7–25)
CO2: 24 mmol/L (ref 20–32)
Calcium: 9.3 mg/dL (ref 8.6–10.4)
Chloride: 108 mmol/L (ref 98–110)
Creat: 0.93 mg/dL (ref 0.60–0.93)
GFR, Est African American: 72 mL/min/{1.73_m2} (ref >=60)
GFR, Est Non African American: 62 mL/min/{1.73_m2} (ref >=60)
Globulin: 2 g/dL (calc) (ref 1.9–3.7)
Glucose, Bld: 85 mg/dL (ref 65–99)
Potassium: 4 mmol/L (ref 3.5–5.3)
Sodium: 142 mmol/L (ref 135–146)
Total Bilirubin: 0.8 mg/dL (ref 0.2–1.2)
Total Protein: 6.4 g/dL (ref 6.1–8.1)

## 2020-12-06 LAB — QUANTIFERON-TB GOLD PLUS
Mitogen-NIL: >10 IU/mL
NIL: 0.06 IU/mL
QuantiFERON-TB Gold Plus: NEGATIVE
TB1-NIL: <0 IU/mL
TB2-NIL: <0 IU/mL

## 2020-12-06 LAB — CBC WITH DIFFERENTIAL/PLATELET
Absolute Monocytes: 461 cells/uL (ref 200–950)
Basophils Absolute: 71 cells/uL (ref 0–200)
Basophils Relative: 1.5 %
Eosinophils Absolute: 249 cells/uL (ref 15–500)
Eosinophils Relative: 5.3 %
HCT: 35.3 % (ref 35.0–45.0)
Hemoglobin: 12.2 g/dL (ref 11.7–15.5)
Lymphs Abs: 1415 cells/uL (ref 850–3900)
MCH: 32.3 pg (ref 27.0–33.0)
MCHC: 34.6 g/dL (ref 32.0–36.0)
MCV: 93.4 fL (ref 80.0–100.0)
MPV: 9.8 fL (ref 7.5–12.5)
Monocytes Relative: 9.8 %
Neutro Abs: 2505 cells/uL (ref 1500–7800)
Neutrophils Relative %: 53.3 %
Platelets: 209 10*3/uL (ref 140–400)
RBC: 3.78 10*6/uL — ABNORMAL LOW (ref 3.80–5.10)
RDW: 11.7 % (ref 11.0–15.0)
Total Lymphocyte: 30.1 %
WBC: 4.7 10*3/uL (ref 3.8–10.8)

## 2020-12-06 NOTE — Progress Notes (Signed)
CBC and CMP normal.  TB Gold is negative.

## 2020-12-08 DIAGNOSIS — R2689 Other abnormalities of gait and mobility: Secondary | ICD-10-CM | POA: Diagnosis not present

## 2020-12-08 DIAGNOSIS — Z9181 History of falling: Secondary | ICD-10-CM | POA: Diagnosis not present

## 2020-12-12 DIAGNOSIS — M1711 Unilateral primary osteoarthritis, right knee: Secondary | ICD-10-CM | POA: Diagnosis not present

## 2020-12-13 DIAGNOSIS — Z9181 History of falling: Secondary | ICD-10-CM | POA: Diagnosis not present

## 2020-12-13 DIAGNOSIS — R2689 Other abnormalities of gait and mobility: Secondary | ICD-10-CM | POA: Diagnosis not present

## 2020-12-15 DIAGNOSIS — R2689 Other abnormalities of gait and mobility: Secondary | ICD-10-CM | POA: Diagnosis not present

## 2020-12-15 DIAGNOSIS — Z9181 History of falling: Secondary | ICD-10-CM | POA: Diagnosis not present

## 2020-12-20 DIAGNOSIS — Z9181 History of falling: Secondary | ICD-10-CM | POA: Diagnosis not present

## 2020-12-20 DIAGNOSIS — R2689 Other abnormalities of gait and mobility: Secondary | ICD-10-CM | POA: Diagnosis not present

## 2021-01-03 ENCOUNTER — Telehealth: Payer: Self-pay | Admitting: Rheumatology

## 2021-01-03 ENCOUNTER — Other Ambulatory Visit: Payer: Self-pay | Admitting: *Deleted

## 2021-01-03 MED ORDER — ENBREL SURECLICK 50 MG/ML ~~LOC~~ SOAJ
50.0000 mg | SUBCUTANEOUS | 0 refills | Status: DC
Start: 2021-01-03 — End: 2021-03-20

## 2021-01-03 NOTE — Telephone Encounter (Signed)
Next Visit: 03/20/2021  Last Visit: 10/17/2020  Last Fill: 10/11/2020  OH:KGOVPCHEKB arthritis with rheumatoid factor of multiple sites without organ or systems involvement  Current Dose per office note 11/22/8183: Enbrel SureClick 50 mg every 7 days  Labs: 12/04/2020, CBC and CMP normal.    TB Gold: 12/04/2020,  TB Gold is negative.  Okay to refill Enbrel?

## 2021-01-03 NOTE — Telephone Encounter (Signed)
Patient request rx for Enbrel to be sent into [facility].

## 2021-01-09 ENCOUNTER — Other Ambulatory Visit: Payer: Self-pay | Admitting: Physician Assistant

## 2021-01-09 DIAGNOSIS — M0579 Rheumatoid arthritis with rheumatoid factor of multiple sites without organ or systems involvement: Secondary | ICD-10-CM

## 2021-01-09 MED ORDER — GABAPENTIN 300 MG PO CAPS
ORAL_CAPSULE | ORAL | 0 refills | Status: DC
Start: 2021-01-09 — End: 2021-04-02

## 2021-01-09 NOTE — Telephone Encounter (Signed)
Next Visit: 03/20/2021   Last Visit: 10/17/2020   Last Fill: 10/10/2020  Dx: Myofascial pain syndrome  Current Dose per office note 10/17/2020: not discussed  Okay to refill Gabapentin?

## 2021-01-11 ENCOUNTER — Telehealth: Payer: Self-pay

## 2021-01-11 NOTE — Telephone Encounter (Signed)
I called patient, RX for Lerna sent on June 6 for 90 day supply, [facility] only filled 23 pills, patient will call [facility] for remainder of RX.

## 2021-01-11 NOTE — Telephone Encounter (Signed)
Patient called stating she received 2 of her 3 medications yesterday 01/10/21.  Patient states she didn't receive her Jolee Ewing which is the prescription she is almost out of.  Patient requested a return call.

## 2021-01-12 ENCOUNTER — Encounter: Payer: Self-pay | Admitting: Cardiology

## 2021-01-12 ENCOUNTER — Other Ambulatory Visit: Payer: Self-pay

## 2021-01-12 ENCOUNTER — Ambulatory Visit: Payer: PPO | Admitting: Cardiology

## 2021-01-12 VITALS — BP 130/78 | HR 60 | Ht 62.5 in | Wt 188.8 lb

## 2021-01-12 DIAGNOSIS — I493 Ventricular premature depolarization: Secondary | ICD-10-CM

## 2021-01-12 DIAGNOSIS — I10 Essential (primary) hypertension: Secondary | ICD-10-CM | POA: Diagnosis not present

## 2021-01-12 DIAGNOSIS — E7800 Pure hypercholesterolemia, unspecified: Secondary | ICD-10-CM

## 2021-01-12 NOTE — Progress Notes (Signed)
Cardiology Office Note:    Date:  01/12/2021   ID:  Kari [name], DOB [birth year], MRN [id number]  PCP:  Aretta Nip, MD   [facility] Providers Cardiologist:  Candee Furbish, MD    Referring MD: Aretta Nip, MD     History of Present Illness:    Kari [name] is a 70 y.o. female here for the follow-up of palpitations, hypertension.  Previously seen by hypertension clinic.  Excellent.  Palps - at night laying down.  Not uncommon to feel this for her.  She does not feel this usually during the day.  No associated shortness of breath chest pain syncope.  She had a right hip replaced, rheumatoid arthritis.  Her son enjoys anime.  She would like to go on a bullet train in [place].  She is also hopeful for a cruise around [place] and [place].  She is going down to the [place] soon.  Has a family history of stroke and myocardial infarction.  Echocardiogram previously reviewed shows normal ejection fraction.  Past Medical History:  Diagnosis Date   Arthritis    Complication of anesthesia    when block attempted ax -turned red in preop-surg delayed   Fibromyalgia    GERD (gastroesophageal reflux disease)    Hypertension    Hypothyroidism    MVP (mitral valve prolapse)    Osteoporosis    Rheumatoid aortitis    ? arthritis    Past Surgical History:  Procedure Laterality Date   ABDOMINAL HYSTERECTOMY     CESAREAN SECTION     CHOLECYSTECTOMY     COLONOSCOPY     JOINT REPLACEMENT     KNEE ARTHROSCOPY Right 2/11   TOTAL HIP ARTHROPLASTY Left 02/03/2013   Procedure: TOTAL HIP ARTHROPLASTY;  Surgeon: Kerin Salen, MD;  Location: [place];  Service: Orthopedics;  Laterality: Left;   TOTAL HIP ARTHROPLASTY Right 09/22/2020   Dr. Nicholes Calamity NERVE TRANSPOSITION  09/17/2011   Procedure: ULNAR NERVE DECOMPRESSION/TRANSPOSITION;  Surgeon: Cammie Sickle., MD;  Location: [place];  Service: Orthopedics;  Laterality: Left;  decompression   WRIST  ARTHROSCOPY Right    x2-fusion     WRIST ARTHROSCOPY Left     Current Medications: Current Meds  Medication Sig   Calcium-Vitamin D (CALTRATE 600 PLUS-VIT D PO) Take 1 tablet by mouth 2 (two) times daily.   cholecalciferol (VITAMIN D) 1000 UNITS tablet Take 1,000 Units by mouth daily.   cyclobenzaprine (FLEXERIL) 10 MG tablet Take 10 mg by mouth as needed.    diclofenac Sodium (VOLTAREN) 1 % GEL Apply topically.   eletriptan (RELPAX) 20 MG tablet as needed.    ENBREL SURECLICK 50 MG/ML injection Inject 50 mg into the skin once a week.   fluticasone (FLONASE) 50 MCG/ACT nasal spray Place 1 spray into the nose daily.   gabapentin (NEURONTIN) 300 MG capsule TAKE FOUR CAPSULES BY MOUTH DAILY   leflunomide (ARAVA) 10 MG tablet TAKE ONE TABLET BY MOUTH ONCE DAILY   levothyroxine (SYNTHROID) 75 MCG tablet Take 75 mcg by mouth every morning.   losartan (COZAAR) 100 MG tablet TAKE ONE TABLET BY MOUTH ONCE DAILY   Multiple Vitamin (MULTIVITAMIN WITH MINERALS) TABS Take 1 tablet by mouth daily.   nadolol (CORGARD) 40 MG tablet Take 40 mg by mouth every morning.   Omega-3 Fatty Acids (FISH OIL PO) Take 1 capsule by mouth daily.   ondansetron (ZOFRAN) 4 MG tablet TAKE ONE TABLET BY MOUTH EVERY  8 HOURS AS NEEDED FOR NAUSEA AND VOMITING   OVER THE COUNTER MEDICATION at bedtime. CBD   pantoprazole (PROTONIX) 40 MG tablet Take 40 mg by mouth 2 (two) times daily.    PROAIR HFA 108 (90 BASE) MCG/ACT inhaler as needed.    rosuvastatin (CRESTOR) 20 MG tablet TAKE ONE TABLET BY MOUTH ONCE DAILY   spironolactone (ALDACTONE) 25 MG tablet TAKE ONE TABLET BY MOUTH ONCE DAILY   sucralfate (CARAFATE) 1 g tablet Take 1 g by mouth as needed (stomach).    valACYclovir (VALTREX) 1000 MG tablet as needed.     Allergies:   Humira [adalimumab], Clinoril [sulindac], Erythromycin, Hydrocodone, Ibuprofen, Penicillins, Plaquenil [hydroxychloroquine], Sulfa antibiotics, Trimethoprim, and Vancomycin cross reactors   Social  History   Socioeconomic History   Marital status: Widowed    Spouse name: Not on file   Number of children: Not on file   Years of education: Not on file   Highest education level: Not on file  Occupational History   Not on file  Tobacco Use   Smoking status: Never   Smokeless tobacco: Never  Vaping Use   Vaping Use: Never used  Substance and Sexual Activity   Alcohol use: Yes    Comment: rarely    Drug use: No   Sexual activity: Not on file  Other Topics Concern   Not on file  Social History Narrative   Not on file   Social Determinants of Health   Financial Resource Strain: Not on file  Food Insecurity: Not on file  Transportation Needs: Not on file  Physical Activity: Not on file  Stress: Not on file  Social Connections: Not on file     Family History: The patient's family history includes Colon cancer in an other family member; Colon polyps in an other family member; Heart attack in her father; Heart disease in her mother; Hypothyroidism in her son; Stroke in her mother.  ROS:   Please see the history of present illness.     All other systems reviewed and are negative.  EKGs/Labs/Other Studies Reviewed:    The following studies were reviewed today:   Echocardiogram 11/08/2019:   1. Left ventricular ejection fraction, by estimation, is 60 to 65%. The  left ventricle has normal function. The left ventricle has no regional  wall motion abnormalities. There is moderate left ventricular hypertrophy  of the basal-septal segment. Left  ventricular diastolic parameters are consistent with Grade I diastolic  dysfunction (impaired relaxation). Elevated left ventricular end-diastolic  pressure.   2. Right ventricular systolic function is normal. The right ventricular  size is normal.   3. No evidence of mitral valve prolapse. The mitral valve is normal in  structure. No evidence of mitral valve regurgitation. No evidence of  mitral stenosis.   4. The aortic valve  is tricuspid. Aortic valve regurgitation is not  visualized. Mild aortic valve sclerosis is present, with no evidence of  aortic valve stenosis.   5. The inferior vena cava is normal in size with greater than 50%  respiratory variability, suggesting right atrial pressure of 3 mmHg.    EKG:  EKG is  ordered today.  The ekg ordered today demonstrates sinus rhythm 60 left anterior fascicular block poor R wave progression  Recent Labs: 12/04/2020: ALT 17; BUN 15; Creat 0.93; Hemoglobin 12.2; Platelets 209; Potassium 4.0; Sodium 142  Recent Lipid Panel No results found for: CHOL, TRIG, HDL, CHOLHDL, VLDL, LDLCALC, LDLDIRECT   Risk Assessment/Calculations:  Physical Exam:    VS:  BP 130/78   Pulse 60   Ht 5' 2.5" (1.588 m)   Wt 188 lb 12.8 oz (85.6 kg)   SpO2 99%   BMI 33.98 kg/m     Wt Readings from Last 3 Encounters:  01/12/21 188 lb 12.8 oz (85.6 kg)  10/17/20 190 lb 3.2 oz (86.3 kg)  05/19/20 190 lb (86.2 kg)     GEN:  Well nourished, well developed in no acute distress HEENT: Normal NECK: No JVD; No carotid bruits LYMPHATICS: No lymphadenopathy CARDIAC: RRR, no murmurs, rubs, gallops RESPIRATORY:  Clear to auscultation without rales, wheezing or rhonchi  ABDOMEN: Soft, non-tender, non-distended MUSCULOSKELETAL:  No edema; No deformity  SKIN: Warm and dry NEUROLOGIC:  Alert and oriented x 3 PSYCHIATRIC:  Normal affect   ASSESSMENT:    1. PVC (premature ventricular contraction)   2. Essential hypertension   3. Pure hypercholesterolemia    PLAN:    In order of problems listed above:  Palpitations - Has had what seemed like PVCs by history.  They improved after starting spironolactone interestingly.  Perhaps potassium levels play a role. - She is also on Corgard or nadolol 40 mg every morning, beta-blocker.  These help as well.  No change in her medical management.  Refill beta-blocker as needed.  Rheumatoid arthritis - On leflunomide.  Rheumatoid  modulator.  Excellent.  Per primary team.  Last ALT 17. Right hip replaced March 2022  Essential hypertension - Overall well controlled with losartan 100 mg a day nadolol 40 mg a day, spironolactone 25 mg a day.  Refills as needed for this medical management.  Prior echocardiogram showed no evidence of mitral valve prolapse.  Very soft murmur noted on exam.  Hyperlipidemia - Continue with Crestor 20 mg a day LDL 71 HDL 48 creatinine 0.9 hemoglobin 12.2.  Wonderful.    1 year   Medication Adjustments/Labs and Tests Ordered: Current medicines are reviewed at length with the patient today.  Concerns regarding medicines are outlined above.  Orders Placed This Encounter  Procedures   EKG 12-Lead    No orders of the defined types were placed in this encounter.   Patient Instructions  Medication Instructions:  The current medical regimen is effective;  continue present plan and medications.  *If you need a refill on your cardiac medications before your next appointment, please call your pharmacy*  Follow-Up: At [facility], you and your health needs are our priority.  As part of our continuing mission to provide you with exceptional heart care, we have created designated Provider Care Teams.  These Care Teams include your primary Cardiologist (physician) and Advanced Practice Providers (APPs -  Physician Assistants and Nurse Practitioners) who all work together to provide you with the care you need, when you need it.  We recommend signing up for the patient portal called "MyChart".  Sign up information is provided on this After Visit Summary.  MyChart is used to connect with patients for Virtual Visits (Telemedicine).  Patients are able to view lab/test results, encounter notes, upcoming appointments, etc.  Non-urgent messages can be sent to your provider as well.   To learn more about what you can do with MyChart, go to [email or web address].    Your next appointment:   1  year(s)  The format for your next appointment:   In Person  Provider:   Candee Furbish, MD   Thank you for choosing [facility]!!     Signed, Elta Guadeloupe  Marlou Porch, MD  01/12/2021 10:03 AM    [facility]

## 2021-01-12 NOTE — Patient Instructions (Signed)
Medication Instructions:  The current medical regimen is effective;  continue present plan and medications.  *If you need a refill on your cardiac medications before your next appointment, please call your pharmacy*  Follow-Up: At [facility], you and your health needs are our priority.  As part of our continuing mission to provide you with exceptional heart care, we have created designated Provider Care Teams.  These Care Teams include your primary Cardiologist (physician) and Advanced Practice Providers (APPs -  Physician Assistants and Nurse Practitioners) who all work together to provide you with the care you need, when you need it.  We recommend signing up for the patient portal called "MyChart".  Sign up information is provided on this After Visit Summary.  MyChart is used to connect with patients for Virtual Visits (Telemedicine).  Patients are able to view lab/test results, encounter notes, upcoming appointments, etc.  Non-urgent messages can be sent to your provider as well.   To learn more about what you can do with MyChart, go to [email or web address].    Your next appointment:   1 year(s)  The format for your next appointment:   In Person  Provider:   Mark Skains, MD   Thank you for choosing [facility]!!    

## 2021-02-05 ENCOUNTER — Telehealth: Payer: Self-pay

## 2021-02-05 DIAGNOSIS — U071 COVID-19: Secondary | ICD-10-CM | POA: Diagnosis not present

## 2021-02-05 DIAGNOSIS — Z6833 Body mass index (BMI) 33.0-33.9, adult: Secondary | ICD-10-CM | POA: Diagnosis not present

## 2021-02-05 NOTE — Telephone Encounter (Signed)
Patient called stating she tested positive for Covid today.  Patient states her PCP is sending prescription of Paxlovid for her to pick up tonight.  Patient states she is due to take her Enbrel medication on Wednesday, 02/07/21 and requested a return call to let her know if she needs to hold it.

## 2021-02-05 NOTE — Telephone Encounter (Signed)
Advised the patient to hold Enbrel for 1-2 weeks after her symptoms have completely resolved. Patient verbalized understanding.

## 2021-02-05 NOTE — Telephone Encounter (Signed)
Please advise the patient to hold Enbrel for 1-2 weeks after her symptoms have completely resolved.

## 2021-02-05 NOTE — Telephone Encounter (Signed)
How long should patient hold enbrel due to testing positive for COVID-19?

## 2021-03-06 ENCOUNTER — Other Ambulatory Visit: Payer: Self-pay | Admitting: Rheumatology

## 2021-03-06 ENCOUNTER — Other Ambulatory Visit: Payer: Self-pay | Admitting: *Deleted

## 2021-03-06 DIAGNOSIS — M0579 Rheumatoid arthritis with rheumatoid factor of multiple sites without organ or systems involvement: Secondary | ICD-10-CM

## 2021-03-06 DIAGNOSIS — Z79899 Other long term (current) drug therapy: Secondary | ICD-10-CM | POA: Diagnosis not present

## 2021-03-06 MED ORDER — LEFLUNOMIDE 10 MG PO TABS: 10.0000 mg | ORAL_TABLET | Freq: Every day | ORAL | 0 refills | Status: DC

## 2021-03-06 NOTE — Progress Notes (Signed)
Office Visit Note  Patient: Kari [name]             Date of Birth: [birth year]           MRN: [id number]             PCP: Aretta Nip, MD Referring: Aretta Nip, MD Visit Date: 03/20/2021 Occupation: '@GUAROCC'$ @  Subjective:  Left plantar fascitis   History of Present Illness: Kari [name] is a 70 y.o. female with history seropositive rheumatoid arthritis.  She is on Enbrel 50 mg sq injections once weekly and arava 10 mg 1 tablet by mouth daily.  She has not missed any doses of Enbrel or Arava recently.  She denies any recent rheumatoid arthritis flares.  She states that she was experiencing some pain and stiffness in her left wrist yesterday after standing a wooden butcher block.  She is also been having intermittent paresthesias in her left hand.  She has not been wearing a carpal tunnel splint at night recently.  She had a nerve conduction study on 03/20/2020.  She states that she continues to experience intermittent right sided groin pain.  She states it feels muscular but she has an appointment with Dr. Owens Shark next week for evaluation of her right hip replacement.  She states that overall her left hip replacement has been doing well.  She states that she has been experiencing some discomfort in the left foot due to plantar fasciitis.  She has been performing stretching exercises as well as using orthotics in her shoes and her symptoms have gradually been improving.  She denies any other new concerns. She denies any recent infections.  She is scheduled to receive the second Shingrix vaccine on Thursday.  She will be having annual influenza vaccine on 04/11/2021 at her PCPs office.     Activities of Daily Living:  Patient reports morning stiffness for 1 minute.   Patient Denies nocturnal pain.  Difficulty dressing/grooming: Denies Difficulty climbing stairs: Reports Difficulty getting out of chair: Reports Difficulty using hands for taps, buttons, cutlery, and/or writing:  Reports  Review of Systems  Constitutional:  Positive for fatigue.  HENT:  Negative for mouth sores, mouth dryness and nose dryness.   Eyes:  Positive for pain, itching and dryness. Negative for visual disturbance.  Respiratory:  Negative for cough, hemoptysis, shortness of breath and difficulty breathing.   Cardiovascular:  Negative for chest pain, palpitations, hypertension and swelling in legs/feet.  Gastrointestinal:  Negative for blood in stool, constipation and diarrhea.  Endocrine: Negative for increased urination.  Genitourinary:  Negative for difficulty urinating and painful urination.  Musculoskeletal:  Positive for joint pain, joint pain, joint swelling, myalgias, morning stiffness, muscle tenderness and myalgias. Negative for muscle weakness.  Skin:  Negative for color change, pallor, rash, hair loss, nodules/bumps, redness, skin tightness, ulcers and sensitivity to sunlight.  Allergic/Immunologic: Positive for susceptible to infections.  Neurological:  Positive for numbness. Negative for dizziness, headaches, memory loss and weakness.  Hematological:  Positive for bruising/bleeding tendency. Negative for swollen glands.  Psychiatric/Behavioral:  Negative for depressed mood, confusion and sleep disturbance. The patient is not nervous/anxious.    PMFS History:  Patient Active Problem List   Diagnosis Date Noted   Rheumatoid arthritis with rheumatoid factor of multiple sites without organ or systems involvement ([place]) 10/17/2020   High risk medication use 10/17/2020   History of total replacement of both hip joints 10/17/2020   DDD (degenerative disc disease), lumbar 10/17/2020   Myofascial  pain syndrome 10/17/2020   Age-related osteoporosis without current pathological fracture 10/17/2020   Frequent falls 10/17/2020   Hyperlipidemia 07/14/2015   Palpitation 05/11/2014   PVC (premature ventricular contraction) 05/11/2014   Hypokalemia 05/11/2014   Essential hypertension  05/11/2014   Mitral regurgitation 05/11/2014   Arthritis, hip 02/03/2013    Past Medical History:  Diagnosis Date   Arthritis    Complication of anesthesia    when block attempted ax -turned red in preop-surg delayed   Fibromyalgia    GERD (gastroesophageal reflux disease)    Hypertension    Hypothyroidism    MVP (mitral valve prolapse)    Osteoporosis    Rheumatoid aortitis    ? arthritis    Family History  Problem Relation Age of Onset   Heart attack Father    Heart disease Mother    Stroke Mother    Colon cancer Other    Colon polyps Other    Hypothyroidism Son    Past Surgical History:  Procedure Laterality Date   ABDOMINAL HYSTERECTOMY     CESAREAN SECTION     CHOLECYSTECTOMY     COLONOSCOPY     JOINT REPLACEMENT     KNEE ARTHROSCOPY Right 2/11   TOTAL HIP ARTHROPLASTY Left 02/03/2013   Procedure: TOTAL HIP ARTHROPLASTY;  Surgeon: Kerin Salen, MD;  Location: [facility];  Service: Orthopedics;  Laterality: Left;   TOTAL HIP ARTHROPLASTY Right 09/22/2020   Dr. Nicholes Calamity NERVE TRANSPOSITION  09/17/2011   Procedure: ULNAR NERVE DECOMPRESSION/TRANSPOSITION;  Surgeon: Cammie Sickle., MD;  Location: [facility];  Service: Orthopedics;  Laterality: Left;  decompression   WRIST ARTHROSCOPY Right    x2-fusion     WRIST ARTHROSCOPY Left    Social History   Social History Narrative   Not on file   Immunization History  Administered Date(s) Administered   PFIZER(Purple Top)SARS-COV-2 Vaccination 08/05/2019, 08/26/2019, 02/14/2020, 01/16/2021     Objective: Vital Signs: BP (!) 165/73 (BP Location: Left Arm, Patient Position: Sitting, Cuff Size: Normal)   Pulse 61   Ht 5' 2.5" (1.588 m)   Wt 195 lb 3.2 oz (88.5 kg)   BMI 35.13 kg/m    Physical Exam Vitals and nursing note reviewed.  Constitutional:      Appearance: She is well-developed.  HENT:     Head: Normocephalic and atraumatic.  Eyes:     Conjunctiva/sclera: Conjunctivae normal.   Pulmonary:     Effort: Pulmonary effort is normal.  Abdominal:     Palpations: Abdomen is soft.  Musculoskeletal:     Cervical back: Normal range of motion.  Skin:    General: Skin is warm and dry.     Capillary Refill: Capillary refill takes less than 2 seconds.  Neurological:     Mental Status: She is alert and oriented to person, place, and time.  Psychiatric:        Behavior: Behavior normal.     Musculoskeletal Exam: C-spine has good range of motion with no discomfort.  Shoulder joints and elbow joints have good range of motion with no discomfort.  Right wrist joint is fused but has no tenderness or synovitis.  She has some tenderness over the left wrist but no synovitis was noted.  No tenderness or synovitis over MCP or PIP joints.  Complete fist formation bilaterally.  Both hip replacements have good range of motion with some discomfort in the right hip.  Knee joints have good range of motion with no  discomfort.  Ankle joints have good range of motion with no tenderness or joint swelling.  CDAI Exam: CDAI Score: 0.8  Patient Global: 4 mm; Provider Global: 4 mm Swollen: 0 ; Tender: 0  Joint Exam 03/20/2021   No joint exam has been documented for this visit   There is currently no information documented on the homunculus. Go to the Rheumatology activity and complete the homunculus joint exam.  Investigation: No additional findings.  Imaging: No results found.  Recent Labs: Lab Results  Component Value Date   WBC 5.4 03/06/2021   HGB 12.3 03/06/2021   PLT 221 03/06/2021   NA 140 03/06/2021   K 4.3 03/06/2021   CL 105 03/06/2021   CO2 26 03/06/2021   GLUCOSE 98 03/06/2021   BUN 13 03/06/2021   CREATININE 0.96 03/06/2021   BILITOT 0.7 03/06/2021   ALKPHOS 72 01/20/2017   AST 23 03/06/2021   ALT 21 03/06/2021   PROT 6.6 03/06/2021   ALBUMIN 4.2 01/20/2017   CALCIUM 9.5 03/06/2021   GFRAA 72 12/04/2020   QFTBGOLDPLUS NEGATIVE 12/04/2020    Speciality  Comments: Recieves Enbrel through [employer] Program  Procedures:  No procedures performed Allergies: Humira [adalimumab], Clinoril [sulindac], Erythromycin, Hydrocodone, Ibuprofen, Penicillins, Plaquenil [hydroxychloroquine], Sulfa antibiotics, Trimethoprim, and Vancomycin cross reactors   Assessment / Plan:     Visit Diagnoses: Rheumatoid arthritis with rheumatoid factor of multiple sites without organ or systems involvement ([place]) - +RF, +ANA, erosive disease: She has no joint tenderness or synovitis on examination today.  She has not had any recent rheumatoid arthritis flares.  Her rheumatoid arthritis has been well controlled on Arava 10 mg 1 tablet by mouth daily and Enbrel 50 mg subcutaneous injections once weekly.  She has not missed any doses of either medication recently.  She experiences intermittent arthralgias and stiffness due to overuse activities.  She was experiencing some tenderness and inflammation in the left wrist yesterday after standing a butchers block for a prolonged period of time.  Her symptoms have improved today.  She will remain on Arava 10 mg 1 tablet by mouth daily and Enbrel 50 mg subcutaneous injections once weekly.  She was advised to notify us if she develops signs or symptoms of recurrent flares.  She will follow-up in the office in 5 months.  High risk medication use - Arava 10 mg 1 tablet by mouth daily and Enbrel SureClick 50 mg subcutaneous injections every 7 days.   Previously on MTX 0.6 ml sq inj q7d-D/c due to nausea. CBC and CMP were drawn on 03/06/2021.  Results were reviewed with the patient today in the office.  She will be due to update lab work in December and every 3 months to monitor for drug toxicity.  Standing orders for CBC and CMP are in place. TB Gold negative on 12/04/2020. She has not had any recent infections.  We discussed the importance of holding Enbrel and Arava if she develops signs or symptoms of an infection and to resume once infection  is completely cleared.  She is scheduled to receive the second Shingrix vaccination on Thursday and will receive the annual influenza vaccine on 04/11/2021 at her PCPs office.  She had her second COVID-19 vaccine booster on 01/16/2021.  Medial epicondylitis of both elbows: Resolved  History of total replacement of both hip joints - RTHR 09/22/20 by Dr. Mayer Camel, [facility] 2014.  She continues to experience intermittent groin pain on the right side.  She has scheduled appointment Dr. Mayer Camel  next week for further evaluation.  She has not been experiencing any pain in the left hip replacement.  She has good range of motion of both hip replacements on examination today.  DDD (degenerative disc disease), lumbar: She has not been experiencing any increased lower back pain recently.  No symptoms of radiculopathy.  Myofascial pain syndrome: She experiences intermittent myalgias and muscle tenderness due to myofascial pain syndrome.  She has not had any recent flares.  She continues to have chronic fatigue which has been stable.  Age-related osteoporosis without current pathological fracture - DEXA 02/21/20: Right total hip BMD 0.703 with T-score -2.0. previously treated with fosamax and forteo (completed 2 years of treatment). Reclast IV on 05/24/2020.  She will be due for her next Reclast infusion in December 2022 pending lab results.  She plans on returning for lab work the first week of December.  She will require CBC, CMP, and vitamin D prior to scheduling the Reclast infusion.  Future order for vitamin D was placed today.  She continues to take a calcium and vitamin D supplement on a daily basis. Her next bone density will be due in August 2023.  Vitamin D deficiency: She continues to take a calcium and vitamin D supplement on a daily basis.  Future order for vitamin D to be checked with her lab work in December was placed today.  Frequent falls: No recent falls.  Other medical conditions are listed as  follows:  History of hyperlipidemia  History of hypertension  History of neuropathy  History of hypothyroidism   Orders: No orders of the defined types were placed in this encounter.  No orders of the defined types were placed in this encounter.    Follow-Up Instructions: Return in about 5 months (around 08/20/2021) for Rheumatoid arthritis.   Ofilia Neas, PA-C  Note - This record has been created using Dragon software.  Chart creation errors have been sought, but may not always  have been located. Such creation errors do not reflect on  the standard of medical care.

## 2021-03-06 NOTE — Telephone Encounter (Signed)
Patient request refill on Arava sent to [facility].

## 2021-03-06 NOTE — Telephone Encounter (Signed)
Next Visit: 03/20/2021   Last Visit: 10/17/2020   Last Fill: 12/04/2020  Dx: Rheumatoid arthritis with rheumatoid factor of multiple sites without organ or systems involvement    Current Dose per office note 10/17/2020: Arava 10 mg 1 tablet daily  Labs: 12/04/2020 CBC and CMP normal. (Updated labs today.)   Okay to refill Arava?

## 2021-03-07 LAB — CBC WITH DIFFERENTIAL/PLATELET
Absolute Monocytes: 470 cells/uL (ref 200–950)
Basophils Absolute: 81 cells/uL (ref 0–200)
Basophils Relative: 1.5 %
Eosinophils Absolute: 292 cells/uL (ref 15–500)
Eosinophils Relative: 5.4 %
HCT: 37.1 % (ref 35.0–45.0)
Hemoglobin: 12.3 g/dL (ref 11.7–15.5)
Lymphs Abs: 1642 cells/uL (ref 850–3900)
MCH: 32.9 pg (ref 27.0–33.0)
MCHC: 33.2 g/dL (ref 32.0–36.0)
MCV: 99.2 fL (ref 80.0–100.0)
MPV: 10 fL (ref 7.5–12.5)
Monocytes Relative: 8.7 %
Neutro Abs: 2916 cells/uL (ref 1500–7800)
Neutrophils Relative %: 54 %
Platelets: 221 10*3/uL (ref 140–400)
RBC: 3.74 10*6/uL — ABNORMAL LOW (ref 3.80–5.10)
RDW: 12.3 % (ref 11.0–15.0)
Total Lymphocyte: 30.4 %
WBC: 5.4 10*3/uL (ref 3.8–10.8)

## 2021-03-07 LAB — COMPLETE METABOLIC PANEL WITH GFR
AG Ratio: 2.1 (calc) (ref 1.0–2.5)
ALT: 21 U/L (ref 6–29)
AST: 23 U/L (ref 10–35)
Albumin: 4.5 g/dL (ref 3.6–5.1)
Alkaline phosphatase (APISO): 61 U/L (ref 37–153)
BUN: 13 mg/dL (ref 7–25)
CO2: 26 mmol/L (ref 20–32)
Calcium: 9.5 mg/dL (ref 8.6–10.4)
Chloride: 105 mmol/L (ref 98–110)
Creat: 0.96 mg/dL (ref 0.60–1.00)
Globulin: 2.1 g/dL (calc) (ref 1.9–3.7)
Glucose, Bld: 98 mg/dL (ref 65–99)
Potassium: 4.3 mmol/L (ref 3.5–5.3)
Sodium: 140 mmol/L (ref 135–146)
Total Bilirubin: 0.7 mg/dL (ref 0.2–1.2)
Total Protein: 6.6 g/dL (ref 6.1–8.1)
eGFR: 63 mL/min/{1.73_m2} (ref >=60)

## 2021-03-07 NOTE — Progress Notes (Signed)
CBC and CMP are normal.

## 2021-03-20 ENCOUNTER — Other Ambulatory Visit: Payer: Self-pay

## 2021-03-20 ENCOUNTER — Ambulatory Visit: Payer: PPO | Admitting: Physician Assistant

## 2021-03-20 ENCOUNTER — Telehealth: Payer: Self-pay

## 2021-03-20 ENCOUNTER — Encounter: Payer: Self-pay | Admitting: Physician Assistant

## 2021-03-20 VITALS — BP 165/73 | HR 61 | Ht 62.5 in | Wt 195.2 lb

## 2021-03-20 DIAGNOSIS — Z96643 Presence of artificial hip joint, bilateral: Secondary | ICD-10-CM

## 2021-03-20 DIAGNOSIS — M810 Age-related osteoporosis without current pathological fracture: Secondary | ICD-10-CM | POA: Diagnosis not present

## 2021-03-20 DIAGNOSIS — M0579 Rheumatoid arthritis with rheumatoid factor of multiple sites without organ or systems involvement: Secondary | ICD-10-CM | POA: Diagnosis not present

## 2021-03-20 DIAGNOSIS — Z8669 Personal history of other diseases of the nervous system and sense organs: Secondary | ICD-10-CM

## 2021-03-20 DIAGNOSIS — E559 Vitamin D deficiency, unspecified: Secondary | ICD-10-CM

## 2021-03-20 DIAGNOSIS — R296 Repeated falls: Secondary | ICD-10-CM | POA: Diagnosis not present

## 2021-03-20 DIAGNOSIS — M5136 Other intervertebral disc degeneration, lumbar region: Secondary | ICD-10-CM | POA: Diagnosis not present

## 2021-03-20 DIAGNOSIS — Z79899 Other long term (current) drug therapy: Secondary | ICD-10-CM

## 2021-03-20 DIAGNOSIS — M7701 Medial epicondylitis, right elbow: Secondary | ICD-10-CM

## 2021-03-20 DIAGNOSIS — Z8679 Personal history of other diseases of the circulatory system: Secondary | ICD-10-CM | POA: Diagnosis not present

## 2021-03-20 DIAGNOSIS — M7918 Myalgia, other site: Secondary | ICD-10-CM | POA: Diagnosis not present

## 2021-03-20 DIAGNOSIS — Z8639 Personal history of other endocrine, nutritional and metabolic disease: Secondary | ICD-10-CM

## 2021-03-20 DIAGNOSIS — M7702 Medial epicondylitis, left elbow: Secondary | ICD-10-CM

## 2021-03-20 MED ORDER — ENBREL SURECLICK 50 MG/ML ~~LOC~~ SOAJ
50.0000 mg | SUBCUTANEOUS | 0 refills | Status: DC
Start: 2021-03-20 — End: 2021-07-30

## 2021-03-20 MED ORDER — ENBREL SURECLICK 50 MG/ML ~~LOC~~ SOAJ
50.0000 mg | SUBCUTANEOUS | 0 refills | Status: DC
Start: 2021-03-20 — End: 2021-03-20

## 2021-03-20 NOTE — Telephone Encounter (Signed)
Patient will be due for IV Reclast pending labs in December 2022, per [name], PA-C. Thanks!

## 2021-03-20 NOTE — Telephone Encounter (Signed)
Enbrel refill requested today at patient's appointment. Please review and send prescription. Thank you!

## 2021-03-20 NOTE — Patient Instructions (Signed)
Standing Labs We placed an order today for your standing lab work.   Please have your standing labs drawn in December and every 3 months    If possible, please have your labs drawn 2 weeks prior to your appointment so that the provider can discuss your results at your appointment.  Please note that you may see your imaging and lab results in MyChart before we have reviewed them. We may be awaiting multiple results to interpret others before contacting you. Please allow our office up to 72 hours to thoroughly review all of the results before contacting the office for clarification of your results.  We have open lab daily: Monday through Thursday from 1:30-4:30 PM and Friday from 1:30-4:00 PM at the office of Dr. Shaili Deveshwar, [facility].   Please be advised, all patients with office appointments requiring lab work will take precedent over walk-in lab work.  If possible, please come for your lab work on Monday and Friday afternoons, as you may experience shorter wait times. The office is located at [address], [address], [place], [place] [address] No appointment is necessary.   Labs are drawn by [facility]. Please bring your co-pay at the time of your lab draw.  You may receive a bill from [facility] for your lab work.  If you wish to have your labs drawn at another location, please call the office 24 hours in advance to send orders.  If you have any questions regarding directions or hours of operation,  please call [phone].   As a reminder, please drink plenty of water prior to coming for your lab work. Thanks!  

## 2021-03-20 NOTE — Addendum Note (Signed)
Addended by: Earnestine Mealing on: 03/20/2021 01:40 PM   Modules accepted: Orders

## 2021-03-20 NOTE — Telephone Encounter (Signed)
Prescription was accidentally sent to [facility], I have called and cancelled. Prescription has been re-sent to [facility].

## 2021-03-27 DIAGNOSIS — Z96641 Presence of right artificial hip joint: Secondary | ICD-10-CM | POA: Diagnosis not present

## 2021-03-29 DIAGNOSIS — I10 Essential (primary) hypertension: Secondary | ICD-10-CM | POA: Diagnosis not present

## 2021-03-29 DIAGNOSIS — M1611 Unilateral primary osteoarthritis, right hip: Secondary | ICD-10-CM | POA: Diagnosis not present

## 2021-03-29 DIAGNOSIS — E7800 Pure hypercholesterolemia, unspecified: Secondary | ICD-10-CM | POA: Diagnosis not present

## 2021-03-29 DIAGNOSIS — M069 Rheumatoid arthritis, unspecified: Secondary | ICD-10-CM | POA: Diagnosis not present

## 2021-03-29 DIAGNOSIS — E785 Hyperlipidemia, unspecified: Secondary | ICD-10-CM | POA: Diagnosis not present

## 2021-03-29 DIAGNOSIS — K219 Gastro-esophageal reflux disease without esophagitis: Secondary | ICD-10-CM | POA: Diagnosis not present

## 2021-03-29 DIAGNOSIS — E039 Hypothyroidism, unspecified: Secondary | ICD-10-CM | POA: Diagnosis not present

## 2021-03-30 ENCOUNTER — Other Ambulatory Visit: Payer: Self-pay | Admitting: Cardiology

## 2021-04-01 ENCOUNTER — Other Ambulatory Visit: Payer: Self-pay | Admitting: Physician Assistant

## 2021-04-01 DIAGNOSIS — M0579 Rheumatoid arthritis with rheumatoid factor of multiple sites without organ or systems involvement: Secondary | ICD-10-CM

## 2021-04-02 DIAGNOSIS — Z23 Encounter for immunization: Secondary | ICD-10-CM | POA: Diagnosis not present

## 2021-04-02 DIAGNOSIS — E7800 Pure hypercholesterolemia, unspecified: Secondary | ICD-10-CM | POA: Diagnosis not present

## 2021-04-02 DIAGNOSIS — Z0000 Encounter for general adult medical examination without abnormal findings: Secondary | ICD-10-CM | POA: Diagnosis not present

## 2021-04-02 DIAGNOSIS — I10 Essential (primary) hypertension: Secondary | ICD-10-CM | POA: Diagnosis not present

## 2021-04-02 DIAGNOSIS — M069 Rheumatoid arthritis, unspecified: Secondary | ICD-10-CM | POA: Diagnosis not present

## 2021-04-02 DIAGNOSIS — M797 Fibromyalgia: Secondary | ICD-10-CM | POA: Diagnosis not present

## 2021-04-02 DIAGNOSIS — E039 Hypothyroidism, unspecified: Secondary | ICD-10-CM | POA: Diagnosis not present

## 2021-04-02 MED ORDER — GABAPENTIN 300 MG PO CAPS
ORAL_CAPSULE | ORAL | 0 refills | Status: DC
Start: 2021-04-02 — End: 2021-07-03

## 2021-04-02 NOTE — Telephone Encounter (Signed)
Next Visit: 08/21/2021  Last Visit: 03/20/2021  Last Fill: 01/09/2021  DX:  Rheumatoid arthritis with rheumatoid factor of multiple sites without organ or systems involvement   Current Dose per office note 03/20/2021: gabapentin not discussed  Too soon to refill Arava  Okay to refill Gabapentin?

## 2021-04-04 ENCOUNTER — Telehealth: Payer: Self-pay

## 2021-04-04 NOTE — Telephone Encounter (Signed)
Patient advised we sent the prescription to [employer] for Enbrel on 03/20/2021 for a 90 supply. Patient will reach out to [place] and set up shipment.

## 2021-04-04 NOTE — Telephone Encounter (Signed)
Patient left a voicemail requesting prescription refill of Enbrel be sent to [facility].

## 2021-04-05 ENCOUNTER — Telehealth: Payer: Self-pay | Admitting: Pharmacist

## 2021-04-05 NOTE — Telephone Encounter (Signed)
Patient dropped of [employer] PAP application for Enbrel for re-enrollment  Provider portion signed by [name], PA-C.  Faxed renewal application to [employer] for ENBREL patient assistance (including patient portion, provider portion, med list, insurance card copy, and prior [place] approval letter copy).  Fax: [phone] Phone: [phone]  Knox Saliva, PharmD, MPH, BCPS Clinical Pharmacist (Rheumatology and Pulmonology)

## 2021-04-12 DIAGNOSIS — H40013 Open angle with borderline findings, low risk, bilateral: Secondary | ICD-10-CM | POA: Diagnosis not present

## 2021-04-12 DIAGNOSIS — H16223 Keratoconjunctivitis sicca, not specified as Sjogren's, bilateral: Secondary | ICD-10-CM | POA: Diagnosis not present

## 2021-04-12 DIAGNOSIS — H35383 Toxic maculopathy, bilateral: Secondary | ICD-10-CM | POA: Diagnosis not present

## 2021-04-12 DIAGNOSIS — Z961 Presence of intraocular lens: Secondary | ICD-10-CM | POA: Diagnosis not present

## 2021-04-12 NOTE — Telephone Encounter (Addendum)
Received a fax from  [place] regarding an approval for ENBREL patient assistance through 06/30/2022.   Phone number: [phone]  Knox Saliva, PharmD, MPH, BCPS Clinical Pharmacist (Rheumatology and Pulmonology)

## 2021-05-08 DIAGNOSIS — Z6833 Body mass index (BMI) 33.0-33.9, adult: Secondary | ICD-10-CM | POA: Diagnosis not present

## 2021-05-08 DIAGNOSIS — J40 Bronchitis, not specified as acute or chronic: Secondary | ICD-10-CM | POA: Diagnosis not present

## 2021-05-08 DIAGNOSIS — R059 Cough, unspecified: Secondary | ICD-10-CM | POA: Diagnosis not present

## 2021-05-08 DIAGNOSIS — R6883 Chills (without fever): Secondary | ICD-10-CM | POA: Diagnosis not present

## 2021-05-28 DIAGNOSIS — M545 Low back pain, unspecified: Secondary | ICD-10-CM | POA: Diagnosis not present

## 2021-06-01 DIAGNOSIS — R269 Unspecified abnormalities of gait and mobility: Secondary | ICD-10-CM | POA: Diagnosis not present

## 2021-06-01 DIAGNOSIS — M47896 Other spondylosis, lumbar region: Secondary | ICD-10-CM | POA: Diagnosis not present

## 2021-06-01 DIAGNOSIS — M6281 Muscle weakness (generalized): Secondary | ICD-10-CM | POA: Diagnosis not present

## 2021-06-04 ENCOUNTER — Other Ambulatory Visit: Payer: Self-pay | Admitting: *Deleted

## 2021-06-04 DIAGNOSIS — R269 Unspecified abnormalities of gait and mobility: Secondary | ICD-10-CM | POA: Diagnosis not present

## 2021-06-04 DIAGNOSIS — M47896 Other spondylosis, lumbar region: Secondary | ICD-10-CM | POA: Diagnosis not present

## 2021-06-04 DIAGNOSIS — Z79899 Other long term (current) drug therapy: Secondary | ICD-10-CM

## 2021-06-04 DIAGNOSIS — M6281 Muscle weakness (generalized): Secondary | ICD-10-CM | POA: Diagnosis not present

## 2021-06-05 ENCOUNTER — Other Ambulatory Visit: Payer: Self-pay | Admitting: *Deleted

## 2021-06-05 DIAGNOSIS — M0579 Rheumatoid arthritis with rheumatoid factor of multiple sites without organ or systems involvement: Secondary | ICD-10-CM

## 2021-06-05 LAB — CBC WITH DIFFERENTIAL/PLATELET
Absolute Monocytes: 980 cells/uL — ABNORMAL HIGH (ref 200–950)
Basophils Absolute: 68 cells/uL (ref 0–200)
Basophils Relative: 0.6 %
Eosinophils Absolute: 137 cells/uL (ref 15–500)
Eosinophils Relative: 1.2 %
HCT: 37.2 % (ref 35.0–45.0)
Hemoglobin: 12.6 g/dL (ref 11.7–15.5)
Lymphs Abs: 2576 cells/uL (ref 850–3900)
MCH: 33 pg (ref 27.0–33.0)
MCHC: 33.9 g/dL (ref 32.0–36.0)
MCV: 97.4 fL (ref 80.0–100.0)
MPV: 10.1 fL (ref 7.5–12.5)
Monocytes Relative: 8.6 %
Neutro Abs: 7638 cells/uL (ref 1500–7800)
Neutrophils Relative %: 67 %
Platelets: 247 10*3/uL (ref 140–400)
RBC: 3.82 10*6/uL (ref 3.80–5.10)
RDW: 12.2 % (ref 11.0–15.0)
Total Lymphocyte: 22.6 %
WBC: 11.4 10*3/uL — ABNORMAL HIGH (ref 3.8–10.8)

## 2021-06-05 LAB — COMPLETE METABOLIC PANEL WITHOUT GFR
AG Ratio: 2.1 (calc) (ref 1.0–2.5)
ALT: 22 U/L (ref 6–29)
AST: 19 U/L (ref 10–35)
Albumin: 4.1 g/dL (ref 3.6–5.1)
Alkaline phosphatase (APISO): 55 U/L (ref 37–153)
BUN/Creatinine Ratio: 18 (calc) (ref 6–22)
BUN: 20 mg/dL (ref 7–25)
CO2: 24 mmol/L (ref 20–32)
Calcium: 9 mg/dL (ref 8.6–10.4)
Chloride: 105 mmol/L (ref 98–110)
Creat: 1.14 mg/dL — ABNORMAL HIGH (ref 0.60–1.00)
Globulin: 2 g/dL (ref 1.9–3.7)
Glucose, Bld: 117 mg/dL — ABNORMAL HIGH (ref 65–99)
Potassium: 3.5 mmol/L (ref 3.5–5.3)
Sodium: 141 mmol/L (ref 135–146)
Total Bilirubin: 0.7 mg/dL (ref 0.2–1.2)
Total Protein: 6.1 g/dL (ref 6.1–8.1)
eGFR: 51 mL/min/1.73m2 — ABNORMAL LOW

## 2021-06-05 MED ORDER — LEFLUNOMIDE 10 MG PO TABS
10.0000 mg | ORAL_TABLET | Freq: Every day | ORAL | 0 refills | Status: DC
Start: 2021-06-05 — End: 2021-08-13

## 2021-06-05 NOTE — Telephone Encounter (Signed)
Patient requesting a refill on Arava  Next Visit: 08/21/2021  Last Visit: 03/20/2021   Last Fill: 03/06/2021  DX: Rheumatoid arthritis with rheumatoid factor of multiple sites without organ or systems involvement   Current Dose per office note 03/20/2021: Arava 10 mg 1 tablet by mouth daily   Labs: 06/04/2021 Glucose 117, Creat. 1.14, GFR 51, WBC 11.4, Absolute Monocytes 980  Okay to refill Arava?

## 2021-06-06 DIAGNOSIS — R269 Unspecified abnormalities of gait and mobility: Secondary | ICD-10-CM | POA: Diagnosis not present

## 2021-06-06 DIAGNOSIS — M6281 Muscle weakness (generalized): Secondary | ICD-10-CM | POA: Diagnosis not present

## 2021-06-06 DIAGNOSIS — M47896 Other spondylosis, lumbar region: Secondary | ICD-10-CM | POA: Diagnosis not present

## 2021-06-06 NOTE — Progress Notes (Signed)
Glucose is mildly elevated, probably not a fasting sample.  Creatinine is elevated, probably due to the diuretic use.  White cell count is elevated.  Please ask if patient had recent infection or oral prednisone or cortisone injection.

## 2021-06-08 ENCOUNTER — Other Ambulatory Visit: Payer: Self-pay | Admitting: Pharmacist

## 2021-06-08 DIAGNOSIS — M8589 Other specified disorders of bone density and structure, multiple sites: Secondary | ICD-10-CM

## 2021-06-08 DIAGNOSIS — M810 Age-related osteoporosis without current pathological fracture: Secondary | ICD-10-CM

## 2021-06-08 NOTE — Progress Notes (Signed)
Next infusion not yet scheduled for Reclast IV and due for updated orders. She is due on or after 05/24/21  Dose: 5mg  IV every 12 months  Last Clinic Visit: 03/06/21 Next Clinic Visit: 08/21/21  Last infusion: 05/24/20  Labs: CBC and CMP on 06/04/21 - slight drop in GFR likely due to diuretic use  Orders placed for Reclast IV x 1 dose along with premedication of acetaminophen and diphenhydramine to be administered 30 minutes before medication infusion.  ATC patient and provide with phone number for: [facility] [phone]) [facility]. Left VM on mobile number. Home number went to busy tone.  Will follow-up to ensured scheduled and completed  Knox Saliva, PharmD, MPH, BCPS Clinical Pharmacist (Rheumatology and Pulmonology)

## 2021-06-08 NOTE — Telephone Encounter (Signed)
Reclast orders placed with [facility]. Patient had labs completed on 06/04/21. Will f/u to ensure scheduled and completed  Knox Saliva, PharmD, MPH, BCPS Clinical Pharmacist (Rheumatology and Pulmonology)

## 2021-06-12 NOTE — Progress Notes (Signed)
Patient scheduled Reclast infusion for 06/18/21. Will f/u to ensure completed  Knox Saliva, PharmD, MPH, BCPS Clinical Pharmacist (Rheumatology and Pulmonology)

## 2021-06-13 DIAGNOSIS — M47896 Other spondylosis, lumbar region: Secondary | ICD-10-CM | POA: Diagnosis not present

## 2021-06-13 DIAGNOSIS — R269 Unspecified abnormalities of gait and mobility: Secondary | ICD-10-CM | POA: Diagnosis not present

## 2021-06-13 DIAGNOSIS — M6281 Muscle weakness (generalized): Secondary | ICD-10-CM | POA: Diagnosis not present

## 2021-06-15 DIAGNOSIS — M6281 Muscle weakness (generalized): Secondary | ICD-10-CM | POA: Diagnosis not present

## 2021-06-15 DIAGNOSIS — R269 Unspecified abnormalities of gait and mobility: Secondary | ICD-10-CM | POA: Diagnosis not present

## 2021-06-15 DIAGNOSIS — M47896 Other spondylosis, lumbar region: Secondary | ICD-10-CM | POA: Diagnosis not present

## 2021-06-18 ENCOUNTER — Ambulatory Visit (HOSPITAL_COMMUNITY)
Admission: RE | Admit: 2021-06-18 | Discharge: 2021-06-18 | Disposition: A | Discharge status: Home / Self Care | Payer: PPO | Source: Ambulatory Visit | Attending: Rheumatology | Admitting: Rheumatology

## 2021-06-18 ENCOUNTER — Other Ambulatory Visit: Payer: Self-pay

## 2021-06-18 DIAGNOSIS — M810 Age-related osteoporosis without current pathological fracture: Secondary | ICD-10-CM | POA: Diagnosis not present

## 2021-06-18 DIAGNOSIS — M8589 Other specified disorders of bone density and structure, multiple sites: Secondary | ICD-10-CM

## 2021-06-18 MED ORDER — ACETAMINOPHEN 325 MG PO TABS
ORAL_TABLET | ORAL | Status: AC
  Filled 2021-06-18: qty 2

## 2021-06-18 MED ORDER — DIPHENHYDRAMINE HCL 25 MG PO CAPS
ORAL_CAPSULE | ORAL | Status: AC
  Filled 2021-06-18: qty 1

## 2021-06-18 MED ORDER — DIPHENHYDRAMINE HCL 25 MG PO CAPS
25.0000 mg | ORAL_CAPSULE | Freq: Once | ORAL | Status: AC
  Administered 2021-06-18: 12:00:00 25 mg via ORAL

## 2021-06-18 MED ORDER — ZOLEDRONIC ACID 5 MG/100ML IV SOLN
INTRAVENOUS | Status: AC
  Administered 2021-06-18: 12:00:00 5 mg via INTRAVENOUS
  Filled 2021-06-18: qty 100

## 2021-06-18 MED ORDER — ACETAMINOPHEN 325 MG PO TABS
650.0000 mg | ORAL_TABLET | Freq: Once | ORAL | Status: AC
  Administered 2021-06-18: 12:00:00 650 mg via ORAL

## 2021-06-18 MED ORDER — ZOLEDRONIC ACID 5 MG/100ML IV SOLN: 5.0000 mg | Freq: Once | INTRAVENOUS | Status: AC

## 2021-06-19 DIAGNOSIS — R269 Unspecified abnormalities of gait and mobility: Secondary | ICD-10-CM | POA: Diagnosis not present

## 2021-06-19 DIAGNOSIS — M6281 Muscle weakness (generalized): Secondary | ICD-10-CM | POA: Diagnosis not present

## 2021-06-19 DIAGNOSIS — M47896 Other spondylosis, lumbar region: Secondary | ICD-10-CM | POA: Diagnosis not present

## 2021-06-21 DIAGNOSIS — R269 Unspecified abnormalities of gait and mobility: Secondary | ICD-10-CM | POA: Diagnosis not present

## 2021-06-21 DIAGNOSIS — M47896 Other spondylosis, lumbar region: Secondary | ICD-10-CM | POA: Diagnosis not present

## 2021-06-21 DIAGNOSIS — M6281 Muscle weakness (generalized): Secondary | ICD-10-CM | POA: Diagnosis not present

## 2021-06-27 DIAGNOSIS — R269 Unspecified abnormalities of gait and mobility: Secondary | ICD-10-CM | POA: Diagnosis not present

## 2021-06-27 DIAGNOSIS — M6281 Muscle weakness (generalized): Secondary | ICD-10-CM | POA: Diagnosis not present

## 2021-06-27 DIAGNOSIS — M47896 Other spondylosis, lumbar region: Secondary | ICD-10-CM | POA: Diagnosis not present

## 2021-06-29 ENCOUNTER — Other Ambulatory Visit: Payer: Self-pay | Admitting: Cardiology

## 2021-06-29 ENCOUNTER — Other Ambulatory Visit: Payer: Self-pay | Admitting: Physician Assistant

## 2021-06-29 DIAGNOSIS — M47896 Other spondylosis, lumbar region: Secondary | ICD-10-CM | POA: Diagnosis not present

## 2021-06-29 DIAGNOSIS — R269 Unspecified abnormalities of gait and mobility: Secondary | ICD-10-CM | POA: Diagnosis not present

## 2021-06-29 DIAGNOSIS — M6281 Muscle weakness (generalized): Secondary | ICD-10-CM | POA: Diagnosis not present

## 2021-06-29 NOTE — Telephone Encounter (Signed)
Next Visit: 08/21/2021   Last Visit: 03/20/2021   Last Fill: 04/02/2021   DX:  Rheumatoid arthritis with rheumatoid factor of multiple sites without organ or systems involvement    Current Dose per office note 03/20/2021: gabapentin not discussed   Okay to refill Gabapentin?

## 2021-07-03 DIAGNOSIS — M6281 Muscle weakness (generalized): Secondary | ICD-10-CM | POA: Diagnosis not present

## 2021-07-03 DIAGNOSIS — M47896 Other spondylosis, lumbar region: Secondary | ICD-10-CM | POA: Diagnosis not present

## 2021-07-03 DIAGNOSIS — R269 Unspecified abnormalities of gait and mobility: Secondary | ICD-10-CM | POA: Diagnosis not present

## 2021-07-04 DIAGNOSIS — M545 Low back pain, unspecified: Secondary | ICD-10-CM | POA: Diagnosis not present

## 2021-07-05 DIAGNOSIS — R269 Unspecified abnormalities of gait and mobility: Secondary | ICD-10-CM | POA: Diagnosis not present

## 2021-07-05 DIAGNOSIS — M6281 Muscle weakness (generalized): Secondary | ICD-10-CM | POA: Diagnosis not present

## 2021-07-05 DIAGNOSIS — M47896 Other spondylosis, lumbar region: Secondary | ICD-10-CM | POA: Diagnosis not present

## 2021-07-06 ENCOUNTER — Other Ambulatory Visit: Payer: Self-pay | Admitting: Orthopaedic Surgery

## 2021-07-06 DIAGNOSIS — M5416 Radiculopathy, lumbar region: Secondary | ICD-10-CM

## 2021-07-09 ENCOUNTER — Other Ambulatory Visit: Payer: Self-pay | Admitting: Physician Assistant

## 2021-07-09 DIAGNOSIS — M0579 Rheumatoid arthritis with rheumatoid factor of multiple sites without organ or systems involvement: Secondary | ICD-10-CM

## 2021-07-10 DIAGNOSIS — R269 Unspecified abnormalities of gait and mobility: Secondary | ICD-10-CM | POA: Diagnosis not present

## 2021-07-10 DIAGNOSIS — M47896 Other spondylosis, lumbar region: Secondary | ICD-10-CM | POA: Diagnosis not present

## 2021-07-10 DIAGNOSIS — M6281 Muscle weakness (generalized): Secondary | ICD-10-CM | POA: Diagnosis not present

## 2021-07-12 DIAGNOSIS — M47896 Other spondylosis, lumbar region: Secondary | ICD-10-CM | POA: Diagnosis not present

## 2021-07-12 DIAGNOSIS — M6281 Muscle weakness (generalized): Secondary | ICD-10-CM | POA: Diagnosis not present

## 2021-07-12 DIAGNOSIS — R269 Unspecified abnormalities of gait and mobility: Secondary | ICD-10-CM | POA: Diagnosis not present

## 2021-07-27 ENCOUNTER — Other Ambulatory Visit: Payer: Self-pay | Admitting: *Deleted

## 2021-07-27 ENCOUNTER — Ambulatory Visit
Admission: RE | Admit: 2021-07-27 | Discharge: 2021-07-27 | Disposition: A | Discharge status: Home / Self Care | Payer: PPO | Source: Ambulatory Visit | Attending: Orthopaedic Surgery | Admitting: Orthopaedic Surgery

## 2021-07-27 DIAGNOSIS — Z79899 Other long term (current) drug therapy: Secondary | ICD-10-CM

## 2021-07-27 DIAGNOSIS — M545 Low back pain, unspecified: Secondary | ICD-10-CM | POA: Diagnosis not present

## 2021-07-27 DIAGNOSIS — M47816 Spondylosis without myelopathy or radiculopathy, lumbar region: Secondary | ICD-10-CM | POA: Diagnosis not present

## 2021-07-27 DIAGNOSIS — M5416 Radiculopathy, lumbar region: Secondary | ICD-10-CM

## 2021-07-28 LAB — COMPLETE METABOLIC PANEL WITH GFR
AG Ratio: 2.2 (calc) (ref 1.0–2.5)
ALT: 27 U/L (ref 6–29)
AST: 27 U/L (ref 10–35)
Albumin: 4.3 g/dL (ref 3.6–5.1)
Alkaline phosphatase (APISO): 56 U/L (ref 37–153)
BUN/Creatinine Ratio: 17 (calc) (ref 6–22)
BUN: 18 mg/dL (ref 7–25)
CO2: 26 mmol/L (ref 20–32)
Calcium: 9.5 mg/dL (ref 8.6–10.4)
Chloride: 108 mmol/L (ref 98–110)
Creat: 1.05 mg/dL — ABNORMAL HIGH (ref 0.60–1.00)
Globulin: 2 g/dL (calc) (ref 1.9–3.7)
Glucose, Bld: 88 mg/dL (ref 65–99)
Potassium: 4.2 mmol/L (ref 3.5–5.3)
Sodium: 142 mmol/L (ref 135–146)
Total Bilirubin: 0.9 mg/dL (ref 0.2–1.2)
Total Protein: 6.3 g/dL (ref 6.1–8.1)
eGFR: 56 mL/min/{1.73_m2} — ABNORMAL LOW (ref >=60)

## 2021-07-28 LAB — CBC WITH DIFFERENTIAL/PLATELET
Absolute Monocytes: 671 cells/uL (ref 200–950)
Basophils Absolute: 78 cells/uL (ref 0–200)
Basophils Relative: 1.5 %
Eosinophils Absolute: 380 cells/uL (ref 15–500)
Eosinophils Relative: 7.3 %
HCT: 35.2 % (ref 35.0–45.0)
Hemoglobin: 11.8 g/dL (ref 11.7–15.5)
Lymphs Abs: 1966 cells/uL (ref 850–3900)
MCH: 32.6 pg (ref 27.0–33.0)
MCHC: 33.5 g/dL (ref 32.0–36.0)
MCV: 97.2 fL (ref 80.0–100.0)
MPV: 10.6 fL (ref 7.5–12.5)
Monocytes Relative: 12.9 %
Neutro Abs: 2106 cells/uL (ref 1500–7800)
Neutrophils Relative %: 40.5 %
Platelets: 202 10*3/uL (ref 140–400)
RBC: 3.62 10*6/uL — ABNORMAL LOW (ref 3.80–5.10)
RDW: 12.6 % (ref 11.0–15.0)
Total Lymphocyte: 37.8 %
WBC: 5.2 10*3/uL (ref 3.8–10.8)

## 2021-07-29 NOTE — Progress Notes (Signed)
CBC is normal.  Creatinine is mildly elevated most likely due to the use of diuretics.

## 2021-07-30 ENCOUNTER — Other Ambulatory Visit: Payer: Self-pay | Admitting: *Deleted

## 2021-07-30 MED ORDER — ENBREL SURECLICK 50 MG/ML ~~LOC~~ SOAJ: 50.0000 mg | SUBCUTANEOUS | 0 refills | Status: DC

## 2021-07-30 NOTE — Telephone Encounter (Signed)
Patient request refill on Enbrel  Next Visit: 08/21/2021  Last Visit: 03/20/2021  Last Fill: 03/20/2021  MW:[id number] arthritis with rheumatoid factor of multiple sites without organ or systems involvement   Current Dose per office note 10/01/4740: Enbrel SureClick 50 mg subcutaneous injections every 7 days  Labs: 07/27/2021 CBC is normal.  Creatinine is mildly elevated most likely due to the use of diuretics.  TB Gold: 12/04/2020 Neg    Okay to refill Enbrel?

## 2021-08-07 NOTE — Progress Notes (Signed)
Office Visit Note  Patient: Kari [name]             Date of Birth: December 07, 1950           MRN: [id number]             PCP: Aretta Nip, MD Referring: Aretta Nip, MD Visit Date: 08/21/2021 Occupation: @GUAROCC @  Subjective:  Pain in both hands  History of Present Illness: Kari [name] is a 71 y.o. female with history of seropositive rheumatoid arthritis, osteoarthritis, fibromyalgia and osteoporosis.  She has been taking Enbrel 50 mg subcu weekly and Areva 10 mg p.o. daily.  She has not experienced any side effects.  She has off-and-on discomfort in her hands which gets better after some time.  She has not experienced any joint swelling.  Her bilateral hip replacements are doing well.  She continues to have some lower back pain.  She also has intermittent flares of fibromyalgia with rib cage pain.  Activities of Daily Living:  Patient reports morning stiffness for a few minutes.   Patient Denies nocturnal pain.  Difficulty dressing/grooming: Denies Difficulty climbing stairs: Reports Difficulty getting out of chair: Denies Difficulty using hands for taps, buttons, cutlery, and/or writing: Reports  Review of Systems  Constitutional:  Positive for fatigue.  HENT:  Positive for mouth sores. Negative for mouth dryness and nose dryness.   Eyes:  Positive for dryness. Negative for pain and itching.  Respiratory:  Negative for shortness of breath and difficulty breathing.   Cardiovascular:  Negative for chest pain and palpitations.  Gastrointestinal:  Negative for blood in stool, constipation and diarrhea.  Endocrine: Negative for increased urination.  Genitourinary:  Negative for difficulty urinating.  Musculoskeletal:  Positive for joint pain, joint pain, myalgias, morning stiffness, muscle tenderness and myalgias. Negative for joint swelling.  Skin:  Negative for color change, rash, redness and sensitivity to sunlight.  Allergic/Immunologic: Negative for susceptible  to infections.  Neurological:  Positive for dizziness and headaches. Negative for numbness, memory loss and weakness.  Hematological:  Positive for bruising/bleeding tendency.  Psychiatric/Behavioral:  Negative for depressed mood, confusion and sleep disturbance. The patient is not nervous/anxious.    PMFS History:  Patient Active Problem List   Diagnosis Date Noted   Rheumatoid arthritis with rheumatoid factor of multiple sites without organ or systems involvement ([facility]) 10/17/2020   High risk medication use 10/17/2020   History of total replacement of both hip joints 10/17/2020   DDD (degenerative disc disease), lumbar 10/17/2020   Myofascial pain syndrome 10/17/2020   Age-related osteoporosis without current pathological fracture 10/17/2020   Frequent falls 10/17/2020   Hyperlipidemia 07/14/2015   Palpitation 05/11/2014   PVC (premature ventricular contraction) 05/11/2014   Hypokalemia 05/11/2014   Essential hypertension 05/11/2014   Mitral regurgitation 05/11/2014   Arthritis, hip 02/03/2013    Past Medical History:  Diagnosis Date   Arthritis    Complication of anesthesia    when block attempted ax -turned red in preop-surg delayed   Fibromyalgia    GERD (gastroesophageal reflux disease)    Hypertension    Hypothyroidism    MVP (mitral valve prolapse)    Osteoporosis    Rheumatoid aortitis    ? arthritis    Family History  Problem Relation Age of Onset   Heart attack Father    Heart disease Mother    Stroke Mother    Colon cancer Other    Colon polyps Other    Hypothyroidism Son  Past Surgical History:  Procedure Laterality Date   ABDOMINAL HYSTERECTOMY     CESAREAN SECTION     CHOLECYSTECTOMY     COLONOSCOPY     JOINT REPLACEMENT     KNEE ARTHROSCOPY Right 2/11   TOTAL HIP ARTHROPLASTY Left 02/03/2013   Procedure: TOTAL HIP ARTHROPLASTY;  Surgeon: Kerin Salen, MD;  Location: [place] [facility];  Service: Orthopedics;  Laterality: Left;   TOTAL HIP ARTHROPLASTY Right  09/22/2020   Dr. Nicholes Calamity NERVE TRANSPOSITION  09/17/2011   Procedure: ULNAR NERVE DECOMPRESSION/TRANSPOSITION;  Surgeon: Cammie Sickle., MD;  Location: [place];  Service: Orthopedics;  Laterality: Left;  decompression   WRIST ARTHROSCOPY Right    x2-fusion     WRIST ARTHROSCOPY Left    Social History   Social History Narrative   Not on file   Immunization History  Administered Date(s) Administered   PFIZER(Purple Top)SARS-COV-2 Vaccination 08/05/2019, 08/26/2019, 02/14/2020, 01/16/2021     Objective: Vital Signs: BP (!) 147/80 (BP Location: Left Arm, Patient Position: Sitting, Cuff Size: Large)    Pulse (!) 57    Ht 5' 2.5" (1.588 m)    Wt 196 lb 9.6 oz (89.2 kg)    BMI 35.39 kg/m    Physical Exam Vitals and nursing note reviewed.  Constitutional:      Appearance: She is well-developed.  HENT:     Head: Normocephalic and atraumatic.  Eyes:     Conjunctiva/sclera: Conjunctivae normal.  Cardiovascular:     Rate and Rhythm: Normal rate and regular rhythm.     Heart sounds: Normal heart sounds.  Pulmonary:     Effort: Pulmonary effort is normal.     Breath sounds: Normal breath sounds.  Abdominal:     General: Bowel sounds are normal.     Palpations: Abdomen is soft.  Musculoskeletal:     Cervical back: Normal range of motion.  Lymphadenopathy:     Cervical: No cervical adenopathy.  Skin:    General: Skin is warm and dry.     Capillary Refill: Capillary refill takes less than 2 seconds.  Neurological:     Mental Status: She is alert and oriented to person, place, and time.  Psychiatric:        Behavior: Behavior normal.     Musculoskeletal Exam: C-spine was in good range of motion.  Shoulder joints and elbow joints in good range of motion.  Her right wrist joint is fused.  Left wrist joint was in full range of motion.  There was no synovitis over wrist joints MCPs PIPs or DIPs.  Bilateral hip joints were replaced and were in good range of  motion without discomfort.  Bilateral knee joints with good range of motion.  She had no tenderness over ankles or MTPs.  CDAI Exam: CDAI Score: 0.7  Patient Global: 5 mm; Provider Global: 2 mm Swollen: 0 ; Tender: 0  Joint Exam 08/21/2021   No joint exam has been documented for this visit   There is currently no information documented on the homunculus. Go to the Rheumatology activity and complete the homunculus joint exam.  Investigation: No additional findings.  Imaging: MR LUMBAR SPINE WO CONTRAST  Result Date: 07/29/2021 CLINICAL DATA:  Low back pain for 5 months. EXAM: MRI LUMBAR SPINE WITHOUT CONTRAST TECHNIQUE: Multiplanar, multisequence MR imaging of the lumbar spine was performed. No intravenous contrast was administered. COMPARISON:  07/15/2017 FINDINGS: Segmentation:  Standard. Alignment:  Physiologic. Vertebrae: No acute fracture, evidence of discitis,  or aggressive bone lesion. Conus medullaris and cauda equina: Conus extends to the T12-L1 level. Conus and cauda equina appear normal. Sacral Tarlov cyst. Paraspinal and other soft tissues: No acute paraspinal abnormality. Disc levels: Disc spaces: Disc desiccation throughout the lumbar spine. Disc height loss at T10-11, T11-12 and T12-L1. T12-L1: No significant disc bulge. No neural foraminal stenosis. No central canal stenosis. L1-L2: Mild broad-based disc bulge. Mild bilateral facet arthropathy. No foraminal or central canal stenosis. L2-L3: Mild broad-based disc bulge. Moderate left and mild right facet arthropathy. No foraminal or central canal stenosis. L3-L4: Mild broad-based disc bulge. Severe left and moderate right facet arthropathy. No foraminal or central canal stenosis. L4-L5: Mild broad-based disc bulge. Mild bilateral facet arthropathy. No foraminal or central canal stenosis. L5-S1: No significant disc bulge. No neural foraminal stenosis. No central canal stenosis. IMPRESSION: 1. Lumbar spine spondylosis as described  above. 2. No acute osseous injury of the lumbar spine. Electronically Signed   By: Kathreen Devoid M.D.   On: 07/29/2021 07:58    Recent Labs: Lab Results  Component Value Date   WBC 5.2 07/27/2021   HGB 11.8 07/27/2021   PLT 202 07/27/2021   NA 142 07/27/2021   K 4.2 07/27/2021   CL 108 07/27/2021   CO2 26 07/27/2021   GLUCOSE 88 07/27/2021   BUN 18 07/27/2021   CREATININE 1.05 (H) 07/27/2021   BILITOT 0.9 07/27/2021   ALKPHOS 72 01/20/2017   AST 27 07/27/2021   ALT 27 07/27/2021   PROT 6.3 07/27/2021   ALBUMIN 4.2 01/20/2017   CALCIUM 9.5 07/27/2021   GFRAA 72 12/04/2020   QFTBGOLDPLUS NEGATIVE 12/04/2020    Speciality Comments: Recieves Enbrel through [employer] Program  Procedures:  No procedures performed Allergies: Humira [adalimumab], Clinoril [sulindac], Erythromycin, Hydrocodone, Ibuprofen, Penicillins, Plaquenil [hydroxychloroquine], Sulfa antibiotics, Trimethoprim, and Vancomycin cross reactors   Assessment / Plan:     Visit Diagnoses: Rheumatoid arthritis with rheumatoid factor of multiple sites without organ or systems involvement ([facility]) - +RF, +ANA, erosive disease: She had no synovitis on examination.  She denies any joint swelling.  She has intermittent discomfort in her hands.  She has been tolerating Aleve and Enbrel without any side effects.  High risk medication use - Arava 10 mg 1 tablet by mouth daily and Enbrel SureClick 50 mg subcutaneous injections every 7 days.   Previously on MTX 0.6 ml sq inj q7d-D/c due to nausea.  Labs obtained on July 27, 2021 were reviewed.  Her creatinine is mildly elevated.  TB gold was negative on December 04, 2020.  We will repeat labs in April and every 3 months to monitor for drug toxicity.  She was advised to hold her refi and Enbrel in case she develops an infection and resume once the infection resolves.  Medial epicondylitis of both elbows -she has intermittent discomfort in her elbows.  History of total replacement of  both hip joints - RTHR 09/22/20 by Dr. Mayer Camel, [facility] 2014.  She had good range of motion of bilateral hip joints without any discomfort.  DDD (degenerative disc disease), lumbar -she continues to have lower back pain.  She had recent MRI which I reviewed.  MRI from January 26, 2022 was reviewed which showed mild spondylosis and moderate to severe facet joint arthropathy.  No spinal stenosis was noted.  Core strengthening exercises and regular walking was emphasized.  She would benefit from weight loss.  Myofascial pain syndrome-she continues to have some generalized pain especially in her rib  cage from fibromyalgia.  Stretching exercises were discussed.  Age-related osteoporosis without current pathological fracture - DEXA 02/21/20: Right total hip BMD 0.703 with T-score -2.0. previously treated with fosamax and forteo (completed 2 years of treatment). Reclast IV on 06/18/2021.  She will need repeat DEXA scan in August 2023.  Use of calcium rich diet and vitamin D was emphasized.  Need for regular exercise was discussed.  Vitamin D deficiency-vitamin D has been normal now.  History of hypertension-blood pressure was mildly elevated today.  She has been advised to monitor blood pressure closely.  History of hyperlipidemia  History of hypothyroidism  History of neuropathy  Orders: No orders of the defined types were placed in this encounter.  No orders of the defined types were placed in this encounter.    Follow-Up Instructions: Return in about 5 months (around 01/18/2022) for Rheumatoid arthritis, Osteoarthritis, Osteoporosis.   Bo Merino, MD  Note - This record has been created using Editor, commissioning.  Chart creation errors have been sought, but may not always  have been located. Such creation errors do not reflect on  the standard of medical care.

## 2021-08-08 DIAGNOSIS — Z85828 Personal history of other malignant neoplasm of skin: Secondary | ICD-10-CM | POA: Diagnosis not present

## 2021-08-08 DIAGNOSIS — L821 Other seborrheic keratosis: Secondary | ICD-10-CM | POA: Diagnosis not present

## 2021-08-08 DIAGNOSIS — L578 Other skin changes due to chronic exposure to nonionizing radiation: Secondary | ICD-10-CM | POA: Diagnosis not present

## 2021-08-08 DIAGNOSIS — L820 Inflamed seborrheic keratosis: Secondary | ICD-10-CM | POA: Diagnosis not present

## 2021-08-08 DIAGNOSIS — D485 Neoplasm of uncertain behavior of skin: Secondary | ICD-10-CM | POA: Diagnosis not present

## 2021-08-08 DIAGNOSIS — D225 Melanocytic nevi of trunk: Secondary | ICD-10-CM | POA: Diagnosis not present

## 2021-08-08 DIAGNOSIS — Z86018 Personal history of other benign neoplasm: Secondary | ICD-10-CM | POA: Diagnosis not present

## 2021-08-08 DIAGNOSIS — Z808 Family history of malignant neoplasm of other organs or systems: Secondary | ICD-10-CM | POA: Diagnosis not present

## 2021-08-13 ENCOUNTER — Other Ambulatory Visit: Payer: Self-pay | Admitting: Rheumatology

## 2021-08-13 DIAGNOSIS — M0579 Rheumatoid arthritis with rheumatoid factor of multiple sites without organ or systems involvement: Secondary | ICD-10-CM

## 2021-08-13 MED ORDER — LEFLUNOMIDE 10 MG PO TABS: 10.0000 mg | ORAL_TABLET | Freq: Every day | ORAL | 0 refills | Status: DC

## 2021-08-13 NOTE — Telephone Encounter (Signed)
Next Visit: 08/21/2021  Last Visit: 03/20/2021  Last Fill: 06/05/2021  DX: Rheumatoid arthritis with rheumatoid factor of multiple sites without organ or systems involvement  Current Dose per office note on 03/20/2021: Arava 10 mg 1 tablet by mouth daily  Labs: 07/27/2021 CBC is normal.  Creatinine is mildly elevated most likely due to the use of diuretics.  Okay to refill arava?

## 2021-08-13 NOTE — Telephone Encounter (Signed)
Patient called the office requesting a refill of Arava 10mg  to be sent to [facility].

## 2021-08-15 DIAGNOSIS — M1711 Unilateral primary osteoarthritis, right knee: Secondary | ICD-10-CM | POA: Diagnosis not present

## 2021-08-21 ENCOUNTER — Encounter: Payer: Self-pay | Admitting: Rheumatology

## 2021-08-21 ENCOUNTER — Other Ambulatory Visit: Payer: Self-pay

## 2021-08-21 ENCOUNTER — Ambulatory Visit: Payer: PPO | Admitting: Rheumatology

## 2021-08-21 VITALS — BP 147/80 | HR 57 | Ht 62.5 in | Wt 196.6 lb

## 2021-08-21 DIAGNOSIS — M7702 Medial epicondylitis, left elbow: Secondary | ICD-10-CM | POA: Diagnosis not present

## 2021-08-21 DIAGNOSIS — M5136 Other intervertebral disc degeneration, lumbar region: Secondary | ICD-10-CM | POA: Diagnosis not present

## 2021-08-21 DIAGNOSIS — M810 Age-related osteoporosis without current pathological fracture: Secondary | ICD-10-CM | POA: Diagnosis not present

## 2021-08-21 DIAGNOSIS — M7701 Medial epicondylitis, right elbow: Secondary | ICD-10-CM

## 2021-08-21 DIAGNOSIS — Z8679 Personal history of other diseases of the circulatory system: Secondary | ICD-10-CM | POA: Diagnosis not present

## 2021-08-21 DIAGNOSIS — M0579 Rheumatoid arthritis with rheumatoid factor of multiple sites without organ or systems involvement: Secondary | ICD-10-CM

## 2021-08-21 DIAGNOSIS — Z96643 Presence of artificial hip joint, bilateral: Secondary | ICD-10-CM

## 2021-08-21 DIAGNOSIS — Z8639 Personal history of other endocrine, nutritional and metabolic disease: Secondary | ICD-10-CM | POA: Diagnosis not present

## 2021-08-21 DIAGNOSIS — Z8669 Personal history of other diseases of the nervous system and sense organs: Secondary | ICD-10-CM | POA: Diagnosis not present

## 2021-08-21 DIAGNOSIS — Z79899 Other long term (current) drug therapy: Secondary | ICD-10-CM | POA: Diagnosis not present

## 2021-08-21 DIAGNOSIS — E559 Vitamin D deficiency, unspecified: Secondary | ICD-10-CM

## 2021-08-21 DIAGNOSIS — M7918 Myalgia, other site: Secondary | ICD-10-CM | POA: Diagnosis not present

## 2021-08-21 DIAGNOSIS — R296 Repeated falls: Secondary | ICD-10-CM

## 2021-08-21 NOTE — Patient Instructions (Addendum)
Standing Labs We placed an order today for your standing lab work.   Please have your standing labs drawn in April and every 3 months  If possible, please have your labs drawn 2 weeks prior to your appointment so that the provider can discuss your results at your appointment.  Please note that you may see your imaging and lab results in [facility] before we have reviewed them. We may be awaiting multiple results to interpret others before contacting you. Please allow our office up to 72 hours to thoroughly review all of the results before contacting the office for clarification of your results.  We have open lab daily: Monday through Thursday from 1:30-4:30 PM and Friday from 1:30-4:00 PM at the office of Dr. Bo Merino, [facility].   Please be advised, all patients with office appointments requiring lab work will take precedent over walk-in lab work.  If possible, please come for your lab work on Monday and Friday afternoons, as you may experience shorter wait times. The office is located at [address], [place], [place], [place] [address] No appointment is necessary.   Labs are drawn by [facility]. Please bring your co-pay at the time of your lab draw.  You may receive a bill from [facility] for your lab work.  Please note if you are on Hydroxychloroquine and and an order has been placed for a Hydroxychloroquine level, you will need to have it drawn 4 hours or more after your last dose.  If you wish to have your labs drawn at another location, please call the office 24 hours in advance to send orders.  If you have any questions regarding directions or hours of operation,  please call [phone].   As a reminder, please drink plenty of water prior to coming for your lab work. Thanks!   Vaccines You are taking a medication(s) that can suppress your immune system.  The following immunizations are recommended: Flu annually Covid-19  Td/Tdap (tetanus, diphtheria, pertussis)  every 10 years Pneumonia (Prevnar 15 then Pneumovax 23 at least 1 year apart.  Alternatively, can take Prevnar 20 without needing additional dose) Shingrix: 2 doses from 4 weeks to 6 months apart  Please check with your PCP to make sure you are up to date.   If you have signs or symptoms of an infection or start antibiotics: First, call your PCP for workup of your infection. Hold your medication through the infection, until you complete your antibiotics, and until symptoms resolve if you take the following: Injectable medication (Actemra, Benlysta, Cimzia, Cosentyx, Enbrel, Humira, Kevzara, Orencia, Remicade, Simponi, Stelara, Taltz, Tremfya) Methotrexate Leflunomide (Arava) Mycophenolate (Cellcept) Morrie Sheldon, Olumiant, or Rinvoq  Heart Disease Prevention   Your inflammatory disease increases your risk of heart disease which includes heart attack, stroke, atrial fibrillation (irregular heartbeats), high blood pressure, heart failure and atherosclerosis (plaque in the arteries).  It is important to reduce your risk by:   Keep blood pressure, cholesterol, and blood sugar at healthy levels   Smoking Cessation   Maintain a healthy weight  BMI 20-25   Eat a healthy diet  Plenty of fresh fruit, vegetables, and whole grains  Limit saturated fats, foods high in sodium, and added sugars  DASH and Mediterranean diet   Increase physical activity  Recommend moderate physically activity for 150 minutes per week/ 30 minutes a day for five days a week These can be broken up into three separate ten-minute sessions during the day.   Reduce Stress  Meditation, slow  breathing exercises, yoga, coloring books  Dental visits twice a year

## 2021-09-14 DIAGNOSIS — Z1231 Encounter for screening mammogram for malignant neoplasm of breast: Secondary | ICD-10-CM | POA: Diagnosis not present

## 2021-09-18 DIAGNOSIS — M47816 Spondylosis without myelopathy or radiculopathy, lumbar region: Secondary | ICD-10-CM | POA: Diagnosis not present

## 2021-09-25 DIAGNOSIS — Z96641 Presence of right artificial hip joint: Secondary | ICD-10-CM | POA: Diagnosis not present

## 2021-09-25 DIAGNOSIS — M1611 Unilateral primary osteoarthritis, right hip: Secondary | ICD-10-CM | POA: Diagnosis not present

## 2021-09-26 DIAGNOSIS — M47816 Spondylosis without myelopathy or radiculopathy, lumbar region: Secondary | ICD-10-CM | POA: Diagnosis not present

## 2021-10-01 DIAGNOSIS — E039 Hypothyroidism, unspecified: Secondary | ICD-10-CM | POA: Diagnosis not present

## 2021-10-01 DIAGNOSIS — M069 Rheumatoid arthritis, unspecified: Secondary | ICD-10-CM | POA: Diagnosis not present

## 2021-10-01 DIAGNOSIS — I10 Essential (primary) hypertension: Secondary | ICD-10-CM | POA: Diagnosis not present

## 2021-10-01 DIAGNOSIS — M797 Fibromyalgia: Secondary | ICD-10-CM | POA: Diagnosis not present

## 2021-10-01 DIAGNOSIS — E7800 Pure hypercholesterolemia, unspecified: Secondary | ICD-10-CM | POA: Diagnosis not present

## 2021-10-02 ENCOUNTER — Ambulatory Visit: Payer: PPO | Admitting: Physician Assistant

## 2021-10-02 ENCOUNTER — Encounter: Payer: Self-pay | Admitting: Physician Assistant

## 2021-10-02 ENCOUNTER — Other Ambulatory Visit: Payer: Self-pay

## 2021-10-02 VITALS — BP 164/75 | HR 60 | Ht 62.5 in | Wt 198.0 lb

## 2021-10-02 DIAGNOSIS — M7702 Medial epicondylitis, left elbow: Secondary | ICD-10-CM

## 2021-10-02 DIAGNOSIS — Z79899 Other long term (current) drug therapy: Secondary | ICD-10-CM

## 2021-10-02 DIAGNOSIS — E559 Vitamin D deficiency, unspecified: Secondary | ICD-10-CM

## 2021-10-02 DIAGNOSIS — M7701 Medial epicondylitis, right elbow: Secondary | ICD-10-CM | POA: Diagnosis not present

## 2021-10-02 DIAGNOSIS — Z8639 Personal history of other endocrine, nutritional and metabolic disease: Secondary | ICD-10-CM | POA: Diagnosis not present

## 2021-10-02 DIAGNOSIS — Z96643 Presence of artificial hip joint, bilateral: Secondary | ICD-10-CM

## 2021-10-02 DIAGNOSIS — Z8679 Personal history of other diseases of the circulatory system: Secondary | ICD-10-CM

## 2021-10-02 DIAGNOSIS — M0579 Rheumatoid arthritis with rheumatoid factor of multiple sites without organ or systems involvement: Secondary | ICD-10-CM

## 2021-10-02 DIAGNOSIS — M7918 Myalgia, other site: Secondary | ICD-10-CM | POA: Diagnosis not present

## 2021-10-02 DIAGNOSIS — Z111 Encounter for screening for respiratory tuberculosis: Secondary | ICD-10-CM

## 2021-10-02 DIAGNOSIS — M810 Age-related osteoporosis without current pathological fracture: Secondary | ICD-10-CM | POA: Diagnosis not present

## 2021-10-02 DIAGNOSIS — M5136 Other intervertebral disc degeneration, lumbar region: Secondary | ICD-10-CM | POA: Diagnosis not present

## 2021-10-02 DIAGNOSIS — Z8669 Personal history of other diseases of the nervous system and sense organs: Secondary | ICD-10-CM

## 2021-10-02 MED ORDER — GABAPENTIN 300 MG PO CAPS: ORAL_CAPSULE | ORAL | 0 refills | Status: DC

## 2021-10-02 MED ORDER — LEFLUNOMIDE 10 MG PO TABS: 10.0000 mg | ORAL_TABLET | Freq: Every day | ORAL | 0 refills | Status: DC

## 2021-10-02 NOTE — Patient Instructions (Signed)
Standing Labs ?We placed an order today for your standing lab work.  ? ?Please have your standing labs drawn in July and every 3 months  ? ?If possible, please have your labs drawn 2 weeks prior to your appointment so that the provider can discuss your results at your appointment. ? ?Please note that you may see your imaging and lab results in MyChart before we have reviewed them. ?We may be awaiting multiple results to interpret others before contacting you. ?Please allow our office up to 72 hours to thoroughly review all of the results before contacting the office for clarification of your results. ? ?We have open lab daily: ?Monday through Thursday from 1:30-4:30 PM and Friday from 1:30-4:00 PM ?at the office of Dr. Shaili Deveshwar, [facility].   ?Please be advised, all patients with office appointments requiring lab work will take precedent over walk-in lab work.  ?If possible, please come for your lab work on Monday and Friday afternoons, as you may experience shorter wait times. ?The office is located at [address], [address], [place], [place] [address] ?No appointment is necessary.   ?Labs are drawn by [facility]. Please bring your co-pay at the time of your lab draw.  You may receive a bill from [facility] for your lab work. ? ?Please note if you are on Hydroxychloroquine and and an order has been placed for a Hydroxychloroquine level, you will need to have it drawn 4 hours or more after your last dose. ? ?If you wish to have your labs drawn at another location, please call the office 24 hours in advance to send orders. ? ?If you have any questions regarding directions or hours of operation,  ?please call [phone].   ?As a reminder, please drink plenty of water prior to coming for your lab work. Thanks! ? ?

## 2021-10-02 NOTE — Progress Notes (Signed)
? ?Office Visit Note ? ?Patient: Kari [name]             ?Date of Birth: Nov 01, 1950           ?MRN: [id number]             ?PCP: Aretta Nip, MD ?Referring: Aretta Nip, MD ?Visit Date: 10/02/2021 ?Occupation: '@GUAROCC'$ @ ? ?Subjective:  ?Fibromyalgia flare  ? ?History of Present Illness: Kari [name] is a 71 y.o. female with history of seropositive rheumatoid arthritis, DDD, and myofascial pain. She is taking arava 10 mg 1 tablet by mouth daily and enbrel 50 mg sq injections once weekly.  She has not missed any doses recently.  She denies any recent infections. She denies any recent rheumatoid arthritis flares.  She has not experienced any joint swelling at this time.  She denies any morning stiffness.  She states that she has been having a fibromyalgia flare for the past 3 weeks.  She states that the flare is currently on the right side of her rib cage.  She denies any injury or trauma.  She states that she had recent injections in her back performed by Dr. Ron Agee, which have alleviated her symptoms.  She has tried taking Flexeril sparingly for symptomatic relief and remains on gabapentin 300 mg 4 capsules daily. She has tried applying voltaren gel to the right intercostal muscles and plans on trying salonpas patches.   ? ? ?Activities of Daily Living:  ?Patient reports morning stiffness for 0 minutes.   ?Patient Reports nocturnal pain.  ?Difficulty dressing/grooming: Denies ?Difficulty climbing stairs: Reports ?Difficulty getting out of chair: Denies ?Difficulty using hands for taps, buttons, cutlery, and/or writing: Reports ? ?Review of Systems  ?Constitutional:  Positive for fatigue.  ?HENT:  Negative for mouth sores, mouth dryness and nose dryness.   ?Eyes:  Positive for dryness. Negative for pain and itching.  ?Respiratory:  Negative for shortness of breath and difficulty breathing.   ?Cardiovascular:  Negative for chest pain and palpitations.  ?Gastrointestinal:  Negative for blood in stool,  constipation and diarrhea.  ?Endocrine: Negative for increased urination.  ?Genitourinary:  Negative for difficulty urinating.  ?Musculoskeletal:  Positive for joint pain, joint pain, myalgias, muscle tenderness and myalgias. Negative for joint swelling and morning stiffness.  ?Skin:  Negative for color change, rash and redness.  ?Allergic/Immunologic: Negative for susceptible to infections.  ?Neurological:  Positive for weakness. Negative for dizziness, numbness, headaches and memory loss.  ?Hematological:  Positive for bruising/bleeding tendency.  ?Psychiatric/Behavioral:  Negative for confusion.   ? ?PMFS History:  ?Patient Active Problem List  ? Diagnosis Date Noted  ? Rheumatoid arthritis with rheumatoid factor of multiple sites without organ or systems involvement ([place]) 10/17/2020  ? High risk medication use 10/17/2020  ? History of total replacement of both hip joints 10/17/2020  ? DDD (degenerative disc disease), lumbar 10/17/2020  ? Myofascial pain syndrome 10/17/2020  ? Age-related osteoporosis without current pathological fracture 10/17/2020  ? Frequent falls 10/17/2020  ? Hyperlipidemia 07/14/2015  ? Palpitation 05/11/2014  ? PVC (premature ventricular contraction) 05/11/2014  ? Hypokalemia 05/11/2014  ? Essential hypertension 05/11/2014  ? Mitral regurgitation 05/11/2014  ? Arthritis, hip 02/03/2013  ?  ?Past Medical History:  ?Diagnosis Date  ? Arthritis   ? Complication of anesthesia   ? when block attempted ax -turned red in preop-surg delayed  ? Fibromyalgia   ? GERD (gastroesophageal reflux disease)   ? Hypertension   ? Hypothyroidism   ? MVP (  mitral valve prolapse)   ? Osteoporosis   ? Rheumatoid aortitis   ? ? arthritis  ?  ?Family History  ?Problem Relation Age of Onset  ? Heart attack Father   ? Heart disease Mother   ? Stroke Mother   ? Colon cancer Other   ? Colon polyps Other   ? Hypothyroidism Son   ? ?Past Surgical History:  ?Procedure Laterality Date  ? ABDOMINAL HYSTERECTOMY    ?  CESAREAN SECTION    ? CHOLECYSTECTOMY    ? COLONOSCOPY    ? JOINT REPLACEMENT    ? KNEE ARTHROSCOPY Right 2/11  ? TOTAL HIP ARTHROPLASTY Left 02/03/2013  ? Procedure: TOTAL HIP ARTHROPLASTY;  Surgeon: Kerin Salen, MD;  Location: [facility];  Service: Orthopedics;  Laterality: Left;  ? TOTAL HIP ARTHROPLASTY Right 09/22/2020  ? Dr. Mayer Camel  ? ULNAR NERVE TRANSPOSITION  09/17/2011  ? Procedure: ULNAR NERVE DECOMPRESSION/TRANSPOSITION;  Surgeon: Cammie Sickle., MD;  Location: [facility];  Service: Orthopedics;  Laterality: Left;  decompression  ? WRIST ARTHROSCOPY Right   ? x2-fusion    ? WRIST ARTHROSCOPY Left   ? ?Social History  ? ?Social History Narrative  ? Not on file  ? ?Immunization History  ?Administered Date(s) Administered  ? PFIZER(Purple Top)SARS-COV-2 Vaccination 08/05/2019, 08/26/2019, 02/14/2020, 01/16/2021  ?  ? ?Objective: ?Vital Signs: BP (!) 164/75 (BP Location: Left Arm, Patient Position: Sitting, Cuff Size: Large)   Pulse 60   Ht 5' 2.5" (1.588 m)   Wt 198 lb (89.8 kg)   BMI 35.64 kg/m?   ? ?Physical Exam ?Vitals and nursing note reviewed.  ?Constitutional:   ?   Appearance: She is well-developed.  ?HENT:  ?   Head: Normocephalic and atraumatic.  ?Eyes:  ?   Conjunctiva/sclera: Conjunctivae normal.  ?Cardiovascular:  ?   Rate and Rhythm: Normal rate and regular rhythm.  ?   Heart sounds: Normal heart sounds.  ?Pulmonary:  ?   Effort: Pulmonary effort is normal.  ?   Breath sounds: Normal breath sounds.  ?Abdominal:  ?   General: Bowel sounds are normal.  ?   Palpations: Abdomen is soft.  ?Musculoskeletal:  ?   Cervical back: Normal range of motion.  ?Skin: ?   General: Skin is warm and dry.  ?   Capillary Refill: Capillary refill takes less than 2 seconds.  ?Neurological:  ?   Mental Status: She is alert and oriented to person, place, and time.  ?Psychiatric:     ?   Behavior: Behavior normal.  ?  ? ?Musculoskeletal Exam: C-spine, thoracic spine, and lumbar spine good ROM.   Shoulder joints, elbow joints, wrist joints, Mcps, PIPs, and DIPs good ROM with on synovitis.  Complete fist formation bilaterally.  Tenderness over right side of ribcage.  Hip replacements have good ROM. Knee joints and ankle joints good ROM with no discomfort. No warmth or effusion of knee joints.  No tenderness or swelling of ankle joints.  ? ?CDAI Exam: ?CDAI Score: 0.8  ?Patient Global: 4 mm; Provider Global: 4 mm ?Swollen: 0 ; Tender: 0  ?Joint Exam 10/02/2021  ? ?No joint exam has been documented for this visit  ? ?There is currently no information documented on the homunculus. Go to the Rheumatology activity and complete the homunculus joint exam. ? ?Investigation: ?No additional findings. ? ?Imaging: ?No results found. ? ?Recent Labs: ?Lab Results  ?Component Value Date  ? WBC 5.2 07/27/2021  ? HGB 11.8 07/27/2021  ?  PLT 202 07/27/2021  ? NA 142 07/27/2021  ? K 4.2 07/27/2021  ? CL 108 07/27/2021  ? CO2 26 07/27/2021  ? GLUCOSE 88 07/27/2021  ? BUN 18 07/27/2021  ? CREATININE 1.05 (H) 07/27/2021  ? BILITOT 0.9 07/27/2021  ? ALKPHOS 72 01/20/2017  ? AST 27 07/27/2021  ? ALT 27 07/27/2021  ? PROT 6.3 07/27/2021  ? ALBUMIN 4.2 01/20/2017  ? CALCIUM 9.5 07/27/2021  ? GFRAA 72 12/04/2020  ? QFTBGOLDPLUS NEGATIVE 12/04/2020  ? ? ?Speciality Comments: Recieves Enbrel through [facility] ? ?Procedures:  ?No procedures performed ?Allergies: Humira [adalimumab], Clinoril [sulindac], Erythromycin, Hydrocodone, Ibuprofen, Penicillins, Plaquenil [hydroxychloroquine], Sulfa antibiotics, Trimethoprim, and Vancomycin cross reactors  ? ?Assessment / Plan:     ?Visit Diagnoses: Rheumatoid arthritis with rheumatoid factor of multiple sites without organ or systems involvement ([place]) -She has no joint tenderness or synovitis on examination today.  She has not had any signs or symptoms of a rheumatoid arthritis flare.  She has clinically been doing well on Enbrel 50 mg subcutaneous injections once weekly and Arava  10 mg 1 tablet by mouth daily.  She continues to tolerate both medications without any side effects and has not missed any doses recently.  She has not had any recent infections.  She is not experiencing any morning st

## 2021-10-02 NOTE — Progress Notes (Unsigned)
Gabapentin refill is pended. Please review and send to the pharmacy. Thanks!  ?

## 2021-10-03 LAB — COMPLETE METABOLIC PANEL WITH GFR
AG Ratio: 1.9 (calc) (ref 1.0–2.5)
ALT: 15 U/L (ref 6–29)
AST: 16 U/L (ref 10–35)
Albumin: 4 g/dL (ref 3.6–5.1)
Alkaline phosphatase (APISO): 65 U/L (ref 37–153)
BUN/Creatinine Ratio: 16 (calc) (ref 6–22)
BUN: 18 mg/dL (ref 7–25)
CO2: 27 mmol/L (ref 20–32)
Calcium: 9.1 mg/dL (ref 8.6–10.4)
Chloride: 105 mmol/L (ref 98–110)
Creat: 1.15 mg/dL — ABNORMAL HIGH (ref 0.60–1.00)
Globulin: 2.1 g/dL (calc) (ref 1.9–3.7)
Glucose, Bld: 85 mg/dL (ref 65–99)
Potassium: 3.9 mmol/L (ref 3.5–5.3)
Sodium: 140 mmol/L (ref 135–146)
Total Bilirubin: 0.7 mg/dL (ref 0.2–1.2)
Total Protein: 6.1 g/dL (ref 6.1–8.1)
eGFR: 51 mL/min/{1.73_m2} — ABNORMAL LOW (ref >=60)

## 2021-10-03 LAB — CBC WITH DIFFERENTIAL/PLATELET
Absolute Monocytes: 846 cells/uL (ref 200–950)
Basophils Absolute: 95 cells/uL (ref 0–200)
Basophils Relative: 1 %
Eosinophils Absolute: 257 cells/uL (ref 15–500)
Eosinophils Relative: 2.7 %
HCT: 34.6 % — ABNORMAL LOW (ref 35.0–45.0)
Hemoglobin: 11.5 g/dL — ABNORMAL LOW (ref 11.7–15.5)
Lymphs Abs: 2451 cells/uL (ref 850–3900)
MCH: 32.5 pg (ref 27.0–33.0)
MCHC: 33.2 g/dL (ref 32.0–36.0)
MCV: 97.7 fL (ref 80.0–100.0)
MPV: 9.9 fL (ref 7.5–12.5)
Monocytes Relative: 8.9 %
Neutro Abs: 5852 cells/uL (ref 1500–7800)
Neutrophils Relative %: 61.6 %
Platelets: 226 10*3/uL (ref 140–400)
RBC: 3.54 10*6/uL — ABNORMAL LOW (ref 3.80–5.10)
RDW: 12.2 % (ref 11.0–15.0)
Total Lymphocyte: 25.8 %
WBC: 9.5 10*3/uL (ref 3.8–10.8)

## 2021-10-03 NOTE — Progress Notes (Signed)
Creatinine is elevated-1.15 and GFR is low-51.  Rest of CMP WNL. Please advise the patient to avoid the use of NSAIDs.  ?RBC count, hgb, and hct are low.  Please forward lab work to PCP.

## 2021-10-04 ENCOUNTER — Telehealth: Payer: Self-pay | Admitting: Cardiology

## 2021-10-04 NOTE — Telephone Encounter (Signed)
Dr. Radene Ou calling to speak with Dr. Marlou Porch ?

## 2021-10-04 NOTE — Telephone Encounter (Signed)
Dr Marlou Porch spoke with Dr Zadie Rhine regarding this pt.  No new orders from Dr Marlou Porch. ?

## 2021-10-10 ENCOUNTER — Other Ambulatory Visit: Payer: Self-pay | Admitting: Rheumatology

## 2021-10-10 MED ORDER — ENBREL SURECLICK 50 MG/ML ~~LOC~~ SOAJ
50.0000 mg | SUBCUTANEOUS | 0 refills | Status: DC
Start: 2021-10-10 — End: 2022-01-04

## 2021-10-10 NOTE — Telephone Encounter (Signed)
Next Visit: 01/22/2022 ? ?Last Visit: 10/02/2021 ? ?Last Fill: 07/30/2021 ? ?[id number] arthritis with rheumatoid factor of multiple sites without organ or systems involvement  ? ?Current Dose per office note 10/02/2021: Enbrel 50 mg sq injections once weekly  ? ?Labs: 10/02/2021 Creatinine is elevated-1.15 and GFR is low-51.  Rest of CMP WNL. RBC count, hgb, and hct are low. ? ?TB Gold: 12/04/2020 Neg   ? ?Okay to refill Enbrel?  ?

## 2021-10-10 NOTE — Telephone Encounter (Signed)
Patient called the office requesting a refill of Enbrel 50mg/ml be sent to [facility]. 

## 2021-10-18 DIAGNOSIS — M47816 Spondylosis without myelopathy or radiculopathy, lumbar region: Secondary | ICD-10-CM | POA: Diagnosis not present

## 2021-10-26 DIAGNOSIS — M47816 Spondylosis without myelopathy or radiculopathy, lumbar region: Secondary | ICD-10-CM | POA: Diagnosis not present

## 2021-11-05 ENCOUNTER — Other Ambulatory Visit: Payer: Self-pay

## 2021-11-05 MED ORDER — NADOLOL 40 MG PO TABS: 40.0000 mg | ORAL_TABLET | Freq: Every morning | ORAL | 0 refills | Status: DC

## 2021-11-13 DIAGNOSIS — M546 Pain in thoracic spine: Secondary | ICD-10-CM | POA: Diagnosis not present

## 2021-11-13 DIAGNOSIS — M47816 Spondylosis without myelopathy or radiculopathy, lumbar region: Secondary | ICD-10-CM | POA: Diagnosis not present

## 2021-12-05 DIAGNOSIS — M069 Rheumatoid arthritis, unspecified: Secondary | ICD-10-CM | POA: Diagnosis not present

## 2021-12-05 DIAGNOSIS — M546 Pain in thoracic spine: Secondary | ICD-10-CM | POA: Diagnosis not present

## 2021-12-05 DIAGNOSIS — M797 Fibromyalgia: Secondary | ICD-10-CM | POA: Diagnosis not present

## 2021-12-12 DIAGNOSIS — M546 Pain in thoracic spine: Secondary | ICD-10-CM | POA: Diagnosis not present

## 2021-12-12 DIAGNOSIS — M069 Rheumatoid arthritis, unspecified: Secondary | ICD-10-CM | POA: Diagnosis not present

## 2021-12-12 DIAGNOSIS — M797 Fibromyalgia: Secondary | ICD-10-CM | POA: Diagnosis not present

## 2021-12-14 DIAGNOSIS — M546 Pain in thoracic spine: Secondary | ICD-10-CM | POA: Diagnosis not present

## 2021-12-14 DIAGNOSIS — M797 Fibromyalgia: Secondary | ICD-10-CM | POA: Diagnosis not present

## 2021-12-14 DIAGNOSIS — M069 Rheumatoid arthritis, unspecified: Secondary | ICD-10-CM | POA: Diagnosis not present

## 2021-12-17 DIAGNOSIS — M797 Fibromyalgia: Secondary | ICD-10-CM | POA: Diagnosis not present

## 2021-12-17 DIAGNOSIS — M546 Pain in thoracic spine: Secondary | ICD-10-CM | POA: Diagnosis not present

## 2021-12-17 DIAGNOSIS — M069 Rheumatoid arthritis, unspecified: Secondary | ICD-10-CM | POA: Diagnosis not present

## 2021-12-19 DIAGNOSIS — M546 Pain in thoracic spine: Secondary | ICD-10-CM | POA: Diagnosis not present

## 2021-12-19 DIAGNOSIS — M069 Rheumatoid arthritis, unspecified: Secondary | ICD-10-CM | POA: Diagnosis not present

## 2021-12-19 DIAGNOSIS — M797 Fibromyalgia: Secondary | ICD-10-CM | POA: Diagnosis not present

## 2021-12-22 ENCOUNTER — Other Ambulatory Visit: Payer: Self-pay | Admitting: Cardiology

## 2021-12-24 DIAGNOSIS — M546 Pain in thoracic spine: Secondary | ICD-10-CM | POA: Diagnosis not present

## 2021-12-24 DIAGNOSIS — M797 Fibromyalgia: Secondary | ICD-10-CM | POA: Diagnosis not present

## 2021-12-24 DIAGNOSIS — M069 Rheumatoid arthritis, unspecified: Secondary | ICD-10-CM | POA: Diagnosis not present

## 2021-12-25 ENCOUNTER — Telehealth: Payer: Self-pay | Admitting: Cardiology

## 2021-12-26 DIAGNOSIS — M797 Fibromyalgia: Secondary | ICD-10-CM | POA: Diagnosis not present

## 2021-12-26 DIAGNOSIS — M069 Rheumatoid arthritis, unspecified: Secondary | ICD-10-CM | POA: Diagnosis not present

## 2021-12-26 DIAGNOSIS — M546 Pain in thoracic spine: Secondary | ICD-10-CM | POA: Diagnosis not present

## 2021-12-26 NOTE — Progress Notes (Deleted)
Cardiology Office Note:    Date:  12/26/2021   ID:  Kari [name], DOB 04-09-1951, MRN [id number]  PCP:  Aretta Nip, MD   [facility] Providers Cardiologist:  Candee Furbish, MD { Click to update primary MD,subspecialty MD or APP then REFRESH:1}    Referring MD: Aretta Nip, MD   CC: DOD Chest pressure  History of Present Illness:    Kari [name] is a 71 y.o. female with a hx of Fibromyalgia, RA, and HTN who presents for DOD eval for chest pressure.  Patient notes that (s)he is feeling ***.   Was last feeling well ***. Able to ***  Has had no chest pain, chest pressure, chest tightness, chest stinging ***.  Discomfort occurs with ***, worsens with ***, and improves with ***.    Patient exertion notable for *** with *** and feels no symptoms.    No shortness of breath, DOE ***.  No PND or orthopnea***.  No weight gain***, leg swelling ***, or abdominal swelling***.  No syncope or near syncope ***. Notes *** no palpitations or funny heart beats.     Patient reports prior cardiac testing including ***  No history of ***pre-eclampsia, gestation HTN or gestational DM.  No Fen-Phen or drug use***.  Ambulatory BP ***.   Past Medical History:  Diagnosis Date   Arthritis    Complication of anesthesia    when block attempted ax -turned red in preop-surg delayed   Fibromyalgia    GERD (gastroesophageal reflux disease)    Hypertension    Hypothyroidism    MVP (mitral valve prolapse)    Osteoporosis    Rheumatoid aortitis    ? arthritis    Past Surgical History:  Procedure Laterality Date   ABDOMINAL HYSTERECTOMY     CESAREAN SECTION     CHOLECYSTECTOMY     COLONOSCOPY     JOINT REPLACEMENT     KNEE ARTHROSCOPY Right 2/11   TOTAL HIP ARTHROPLASTY Left 02/03/2013   Procedure: TOTAL HIP ARTHROPLASTY;  Surgeon: Kerin Salen, MD;  Location: [facility];  Service: Orthopedics;  Laterality: Left;   TOTAL HIP ARTHROPLASTY Right 09/22/2020   Dr. Nicholes Calamity  NERVE TRANSPOSITION  09/17/2011   Procedure: ULNAR NERVE DECOMPRESSION/TRANSPOSITION;  Surgeon: Cammie Sickle., MD;  Location: [facility];  Service: Orthopedics;  Laterality: Left;  decompression   WRIST ARTHROSCOPY Right    x2-fusion     WRIST ARTHROSCOPY Left     Current Medications: No outpatient medications have been marked as taking for the 12/27/21 encounter (Appointment) with Werner Lean, MD.     Allergies:   Humira [adalimumab], Clinoril [sulindac], Erythromycin, Hydrocodone, Ibuprofen, Penicillins, Plaquenil [hydroxychloroquine], Sulfa antibiotics, Trimethoprim, and Vancomycin cross reactors   Social History   Socioeconomic History   Marital status: Widowed    Spouse name: Not on file   Number of children: Not on file   Years of education: Not on file   Highest education level: Not on file  Occupational History   Not on file  Tobacco Use   Smoking status: Never    Passive exposure: Never   Smokeless tobacco: Never  Vaping Use   Vaping Use: Never used  Substance and Sexual Activity   Alcohol use: Yes    Comment: rarely    Drug use: No   Sexual activity: Not on file  Other Topics Concern   Not on file  Social History Narrative   Not on file  Social Determinants of Health   Financial Resource Strain: Not on file  Food Insecurity: Not on file  Transportation Needs: Not on file  Physical Activity: Not on file  Stress: Not on file  Social Connections: Not on file     Family History: The patient's family history includes Colon cancer in an other family member; Colon polyps in an other family member; Heart attack in her father; Heart disease in her mother; Hypothyroidism in her son; Stroke in her mother.  ROS:   Please see the history of present illness.     All other systems reviewed and are negative.  EKGs/Labs/Other Studies Reviewed:    The following studies were reviewed today:   EKG:  EKG is *** ordered today.  The ekg  ordered today demonstrates *** 12/27/21: ***  Transthoracic Echocardiogram: Date: Results:  1. Left ventricular ejection fraction, by estimation, is 60 to 65%. The  left ventricle has normal function. The left ventricle has no regional  wall motion abnormalities. There is moderate left ventricular hypertrophy  of the basal-septal segment. Left  ventricular diastolic parameters are consistent with Grade I diastolic  dysfunction (impaired relaxation). Elevated left ventricular end-diastolic  pressure.   2. Right ventricular systolic function is normal. The right ventricular  size is normal.   3. No evidence of mitral valve prolapse. The mitral valve is normal in  structure. No evidence of mitral valve regurgitation. No evidence of  mitral stenosis.   4. The aortic valve is tricuspid. Aortic valve regurgitation is not  visualized. Mild aortic valve sclerosis is present, with no evidence of  aortic valve stenosis.   5. The inferior vena cava is normal in size with greater than 50%  respiratory variability, suggesting right atrial pressure of 3 mmHg.     Recent Labs: 10/02/2021: ALT 15; BUN 18; Creat 1.15; Hemoglobin 11.5; Platelets 226; Potassium 3.9; Sodium 140  Recent Lipid Panel No results found for: "CHOL", "TRIG", "HDL", "CHOLHDL", "VLDL", "LDLCALC", "LDLDIRECT"   Risk Assessment/Calculations:   {Does this patient have ATRIAL FIBRILLATION?[phone]}       Physical Exam:    VS:  There were no vitals taken for this visit.    Wt Readings from Last 3 Encounters:  10/02/21 198 lb (89.8 kg)  08/21/21 196 lb 9.6 oz (89.2 kg)  03/20/21 195 lb 3.2 oz (88.5 kg)     Gen: *** distress, *** obese/well nourished/malnourished   Neck: No JVD, *** carotid bruit Ears: *** Frank Sign Cardiac: No Rubs or Gallops, *** Murmur, ***cardia, *** radial pulses Respiratory: Clear to auscultation bilaterally, *** effort, ***  respiratory rate GI: Soft, nontender, non-distended *** MS: No ***  edema; *** moves all extremities Integument: Skin feels *** Neuro:  At time of evaluation, alert and oriented to person/place/time/situation *** Psych: Normal affect, patient feels ***   ASSESSMENT:    No diagnosis found. PLAN:    Precordial Pain HTN Rheumatoid arthritis - The patient presents with cardiac/possibly cardiac/non-cardiac pain *** - *** Criteria to defer EKG  stress include without evidence of accessory pathway, ventricular pacing, digoxin use, LBBB, or baseline ST changes.  The ASCVD Risk score (Arnett DK, et al., 2019) failed to calculate for the following reasons:   Cannot find a previous HDL lab   Cannot find a previous total cholesterol lab  - Additional Blood Work:  Lipids ***  - *** ASA 81 mg QD, current statin, and beta blocker therapies ***  - Sublingual nitroglycerin as need for chest pain. *** -  Would recommend an echocardiogram to assess LVEF and exclude WMA.   - Would recommend CCTA with possible FFR as needed to exclude obstructive CAD and to assess for non-obstructive CAD requiring secondary prevention - BMI in *** so high tube current may be necessary, *** no hx of AF ivabradine - resting heart rate is ***50-60 bpm, given BP room with ddd Metoprolol 25 mg PO 90 min prior to scan - resting heart rate is ***60-65 bpm, given BP room with add Metoprolol  50 mg PO 90 min prior to scan - resting heart rate is ***65-80 bpm, given BP room with add Metoprolol  100 mg PO 90 min prior to scan - - resting heart rate is ***> 80bpm, given BP room with  add ivabradine 15 mg PO 120 min prior to scan  - GFR is *** necessitating contrast limit of ***      {Are you ordering a CV Procedure (e.g. stress test, cath, DCCV, TEE, etc)?   Press F2        :[id number]}    Medication Adjustments/Labs and Tests Ordered: Current medicines are reviewed at length with the patient today.  Concerns regarding medicines are outlined above.  No orders of the defined types were placed  in this encounter.  No orders of the defined types were placed in this encounter.   There are no Patient Instructions on file for this visit.   Signed, Werner Lean, MD  12/26/2021 8:30 AM    [place]

## 2021-12-27 ENCOUNTER — Ambulatory Visit: Payer: PPO | Admitting: Internal Medicine

## 2021-12-31 DIAGNOSIS — M069 Rheumatoid arthritis, unspecified: Secondary | ICD-10-CM | POA: Diagnosis not present

## 2021-12-31 DIAGNOSIS — M797 Fibromyalgia: Secondary | ICD-10-CM | POA: Diagnosis not present

## 2021-12-31 DIAGNOSIS — M546 Pain in thoracic spine: Secondary | ICD-10-CM | POA: Diagnosis not present

## 2022-01-02 ENCOUNTER — Other Ambulatory Visit: Payer: Self-pay | Admitting: *Deleted

## 2022-01-02 DIAGNOSIS — M797 Fibromyalgia: Secondary | ICD-10-CM | POA: Diagnosis not present

## 2022-01-02 DIAGNOSIS — M069 Rheumatoid arthritis, unspecified: Secondary | ICD-10-CM | POA: Diagnosis not present

## 2022-01-02 DIAGNOSIS — Z79899 Other long term (current) drug therapy: Secondary | ICD-10-CM

## 2022-01-02 DIAGNOSIS — Z111 Encounter for screening for respiratory tuberculosis: Secondary | ICD-10-CM | POA: Diagnosis not present

## 2022-01-02 DIAGNOSIS — M546 Pain in thoracic spine: Secondary | ICD-10-CM | POA: Diagnosis not present

## 2022-01-03 NOTE — Progress Notes (Signed)
CBC normal.  Creatinine is elevated and stable.

## 2022-01-04 ENCOUNTER — Other Ambulatory Visit: Payer: Self-pay | Admitting: *Deleted

## 2022-01-04 DIAGNOSIS — M0579 Rheumatoid arthritis with rheumatoid factor of multiple sites without organ or systems involvement: Secondary | ICD-10-CM

## 2022-01-04 LAB — COMPLETE METABOLIC PANEL WITH GFR
AG Ratio: 1.9 (calc) (ref 1.0–2.5)
ALT: 25 U/L (ref 6–29)
AST: 25 U/L (ref 10–35)
Albumin: 4.2 g/dL (ref 3.6–5.1)
Alkaline phosphatase (APISO): 62 U/L (ref 37–153)
BUN/Creatinine Ratio: 15 (calc) (ref 6–22)
BUN: 17 mg/dL (ref 7–25)
CO2: 26 mmol/L (ref 20–32)
Calcium: 9.3 mg/dL (ref 8.6–10.4)
Chloride: 105 mmol/L (ref 98–110)
Creat: 1.17 mg/dL — ABNORMAL HIGH (ref 0.60–1.00)
Globulin: 2.2 g/dL (calc) (ref 1.9–3.7)
Glucose, Bld: 102 mg/dL — ABNORMAL HIGH (ref 65–99)
Potassium: 4 mmol/L (ref 3.5–5.3)
Sodium: 140 mmol/L (ref 135–146)
Total Bilirubin: 0.6 mg/dL (ref 0.2–1.2)
Total Protein: 6.4 g/dL (ref 6.1–8.1)
eGFR: 50 mL/min/{1.73_m2} — ABNORMAL LOW (ref >=60)

## 2022-01-04 LAB — CBC WITH DIFFERENTIAL/PLATELET
Absolute Monocytes: 595 cells/uL (ref 200–950)
Basophils Absolute: 82 cells/uL (ref 0–200)
Basophils Relative: 1.7 %
Eosinophils Absolute: 250 cells/uL (ref 15–500)
Eosinophils Relative: 5.2 %
HCT: 35.4 % (ref 35.0–45.0)
Hemoglobin: 11.9 g/dL (ref 11.7–15.5)
Lymphs Abs: 1550 cells/uL (ref 850–3900)
MCH: 33.4 pg — ABNORMAL HIGH (ref 27.0–33.0)
MCHC: 33.6 g/dL (ref 32.0–36.0)
MCV: 99.4 fL (ref 80.0–100.0)
MPV: 9.8 fL (ref 7.5–12.5)
Monocytes Relative: 12.4 %
Neutro Abs: 2323 cells/uL (ref 1500–7800)
Neutrophils Relative %: 48.4 %
Platelets: 221 10*3/uL (ref 140–400)
RBC: 3.56 10*6/uL — ABNORMAL LOW (ref 3.80–5.10)
RDW: 11.8 % (ref 11.0–15.0)
Total Lymphocyte: 32.3 %
WBC: 4.8 10*3/uL (ref 3.8–10.8)

## 2022-01-04 LAB — QUANTIFERON-TB GOLD PLUS
Mitogen-NIL: >10 IU/mL
NIL: 0.07 IU/mL
QuantiFERON-TB Gold Plus: NEGATIVE
TB1-NIL: 0 IU/mL
TB2-NIL: 0.01 IU/mL

## 2022-01-04 MED ORDER — GABAPENTIN 300 MG PO CAPS: ORAL_CAPSULE | ORAL | 0 refills | Status: DC

## 2022-01-04 MED ORDER — ENBREL SURECLICK 50 MG/ML ~~LOC~~ SOAJ: 50.0000 mg | SUBCUTANEOUS | 0 refills | Status: DC

## 2022-01-04 MED ORDER — LEFLUNOMIDE 10 MG PO TABS: 10.0000 mg | ORAL_TABLET | Freq: Every day | ORAL | 0 refills | Status: DC

## 2022-01-04 NOTE — Telephone Encounter (Signed)
Patient requested refill on Gabapentin and Arava.   Next Visit: 01/22/2022  Last Visit: 10/02/2021  Last Fill: 10/02/2021  DX: Rheumatoid arthritis with rheumatoid factor of multiple sites without organ or systems involvement History of neuropathy  Current Dose per office note 10/02/2021: arava 10 mg 1 tablet by mouth daily. gabapentin 300 mg 4 capsules daily.  Labs: 01/02/2022 CBC normal.  Creatinine is elevated and stable.  Okay to refill Gabapentin and Arava?

## 2022-01-04 NOTE — Telephone Encounter (Signed)
Patient requested a refill on Enbrel.   Next Visit: 01/22/2022   Last Visit: 10/02/2021   Last Fill: 10/10/2021   DX: Rheumatoid arthritis with rheumatoid factor of multiple sites without organ or systems involvement    Current Dose per office note 10/02/2021: Enbrel 50 mg sq injections once weekly   Labs: 01/02/2022 CBC normal.  Creatinine is elevated and stable.   TB Gold: 12/04/2020 Neg  (patient updated 01/02/2022, results pending.)  Okay to refill

## 2022-01-07 DIAGNOSIS — M546 Pain in thoracic spine: Secondary | ICD-10-CM | POA: Diagnosis not present

## 2022-01-07 DIAGNOSIS — M069 Rheumatoid arthritis, unspecified: Secondary | ICD-10-CM | POA: Diagnosis not present

## 2022-01-07 DIAGNOSIS — M797 Fibromyalgia: Secondary | ICD-10-CM | POA: Diagnosis not present

## 2022-01-07 NOTE — Progress Notes (Signed)
TB gold negative

## 2022-01-08 NOTE — Progress Notes (Deleted)
Office Visit Note  Patient: Kari [name]             Date of Birth: Feb 05, 1951           MRN: [id number]             PCP: Aretta Nip, MD Referring: Aretta Nip, MD Visit Date: 01/22/2022 Occupation: '@GUAROCC'$ @  Subjective:  No chief complaint on file.   History of Present Illness: Kari [name] is a 71 y.o. female ***   Activities of Daily Living:  Patient reports morning stiffness for *** {minute/hour:19697}.   Patient {ACTIONS;DENIES/REPORTS:21021675::"Denies"} nocturnal pain.  Difficulty dressing/grooming: {ACTIONS;DENIES/REPORTS:21021675::"Denies"} Difficulty climbing stairs: {ACTIONS;DENIES/REPORTS:21021675::"Denies"} Difficulty getting out of chair: {ACTIONS;DENIES/REPORTS:21021675::"Denies"} Difficulty using hands for taps, buttons, cutlery, and/or writing: {ACTIONS;DENIES/REPORTS:21021675::"Denies"}  No Rheumatology ROS completed.   PMFS History:  Patient Active Problem List   Diagnosis Date Noted   Rheumatoid arthritis with rheumatoid factor of multiple sites without organ or systems involvement ([place]) 10/17/2020   High risk medication use 10/17/2020   History of total replacement of both hip joints 10/17/2020   DDD (degenerative disc disease), lumbar 10/17/2020   Myofascial pain syndrome 10/17/2020   Age-related osteoporosis without current pathological fracture 10/17/2020   Frequent falls 10/17/2020   Hyperlipidemia 07/14/2015   Palpitation 05/11/2014   PVC (premature ventricular contraction) 05/11/2014   Hypokalemia 05/11/2014   Essential hypertension 05/11/2014   Mitral regurgitation 05/11/2014   Arthritis, hip 02/03/2013    Past Medical History:  Diagnosis Date   Arthritis    Complication of anesthesia    when block attempted ax -turned red in preop-surg delayed   Fibromyalgia    GERD (gastroesophageal reflux disease)    Hypertension    Hypothyroidism    MVP (mitral valve prolapse)    Osteoporosis    Rheumatoid aortitis    ?  arthritis    Family History  Problem Relation Age of Onset   Heart attack Father    Heart disease Mother    Stroke Mother    Colon cancer Other    Colon polyps Other    Hypothyroidism Son    Past Surgical History:  Procedure Laterality Date   ABDOMINAL HYSTERECTOMY     CESAREAN SECTION     CHOLECYSTECTOMY     COLONOSCOPY     JOINT REPLACEMENT     KNEE ARTHROSCOPY Right 2/11   TOTAL HIP ARTHROPLASTY Left 02/03/2013   Procedure: TOTAL HIP ARTHROPLASTY;  Surgeon: Kerin Salen, MD;  Location: [facility];  Service: Orthopedics;  Laterality: Left;   TOTAL HIP ARTHROPLASTY Right 09/22/2020   Dr. Nicholes Calamity NERVE TRANSPOSITION  09/17/2011   Procedure: ULNAR NERVE DECOMPRESSION/TRANSPOSITION;  Surgeon: Cammie Sickle., MD;  Location: [facility];  Service: Orthopedics;  Laterality: Left;  decompression   WRIST ARTHROSCOPY Right    x2-fusion     WRIST ARTHROSCOPY Left    Social History   Social History Narrative   Not on file   Immunization History  Administered Date(s) Administered   PFIZER(Purple Top)SARS-COV-2 Vaccination 08/05/2019, 08/26/2019, 02/14/2020, 01/16/2021     Objective: Vital Signs: There were no vitals taken for this visit.   Physical Exam   Musculoskeletal Exam: ***  CDAI Exam: CDAI Score: -- Patient Global: --; Provider Global: -- Swollen: --; Tender: -- Joint Exam 01/22/2022   No joint exam has been documented for this visit   There is currently no information documented on the homunculus. Go to the Rheumatology activity and complete the  homunculus joint exam.  Investigation: No additional findings.  Imaging: No results found.  Recent Labs: Lab Results  Component Value Date   WBC 4.8 01/02/2022   HGB 11.9 01/02/2022   PLT 221 01/02/2022   NA 140 01/02/2022   K 4.0 01/02/2022   CL 105 01/02/2022   CO2 26 01/02/2022   GLUCOSE 102 (H) 01/02/2022   BUN 17 01/02/2022   CREATININE 1.17 (H) 01/02/2022   BILITOT 0.6 01/02/2022    ALKPHOS 72 01/20/2017   AST 25 01/02/2022   ALT 25 01/02/2022   PROT 6.4 01/02/2022   ALBUMIN 4.2 01/20/2017   CALCIUM 9.3 01/02/2022   GFRAA 72 12/04/2020   QFTBGOLDPLUS NEGATIVE 01/02/2022    Speciality Comments: Recieves Enbrel through [facility] [employer] [facility]  Procedures:  No procedures performed Allergies: Humira [adalimumab], Clinoril [sulindac], Erythromycin, Hydrocodone, Ibuprofen, Penicillins, Plaquenil [hydroxychloroquine], Sulfa antibiotics, Trimethoprim, and Vancomycin cross reactors   Assessment / Plan:     Visit Diagnoses: No diagnosis found.  Orders: No orders of the defined types were placed in this encounter.  No orders of the defined types were placed in this encounter.   Face-to-face time spent with patient was *** minutes. Greater than 50% of time was spent in counseling and coordination of care.  Follow-Up Instructions: No follow-ups on file.   Earnestine Mealing, CMA  Note - This record has been created using Editor, commissioning.  Chart creation errors have been sought, but may not always  have been located. Such creation errors do not reflect on  the standard of medical care.

## 2022-01-09 DIAGNOSIS — M546 Pain in thoracic spine: Secondary | ICD-10-CM | POA: Diagnosis not present

## 2022-01-09 DIAGNOSIS — M797 Fibromyalgia: Secondary | ICD-10-CM | POA: Diagnosis not present

## 2022-01-09 DIAGNOSIS — M069 Rheumatoid arthritis, unspecified: Secondary | ICD-10-CM | POA: Diagnosis not present

## 2022-01-14 DIAGNOSIS — M797 Fibromyalgia: Secondary | ICD-10-CM | POA: Diagnosis not present

## 2022-01-14 DIAGNOSIS — M546 Pain in thoracic spine: Secondary | ICD-10-CM | POA: Diagnosis not present

## 2022-01-14 DIAGNOSIS — M069 Rheumatoid arthritis, unspecified: Secondary | ICD-10-CM | POA: Diagnosis not present

## 2022-01-15 DIAGNOSIS — G5622 Lesion of ulnar nerve, left upper limb: Secondary | ICD-10-CM | POA: Diagnosis not present

## 2022-01-17 DIAGNOSIS — M797 Fibromyalgia: Secondary | ICD-10-CM | POA: Diagnosis not present

## 2022-01-17 DIAGNOSIS — M546 Pain in thoracic spine: Secondary | ICD-10-CM | POA: Diagnosis not present

## 2022-01-17 DIAGNOSIS — M069 Rheumatoid arthritis, unspecified: Secondary | ICD-10-CM | POA: Diagnosis not present

## 2022-01-17 DIAGNOSIS — G5622 Lesion of ulnar nerve, left upper limb: Secondary | ICD-10-CM | POA: Diagnosis not present

## 2022-01-21 ENCOUNTER — Ambulatory Visit
Admission: EM | Admit: 2022-01-21 | Discharge: 2022-01-21 | Disposition: A | Discharge status: Home / Self Care | Payer: PPO | Attending: Internal Medicine | Admitting: Internal Medicine

## 2022-01-21 ENCOUNTER — Encounter: Payer: Self-pay | Admitting: Emergency Medicine

## 2022-01-21 DIAGNOSIS — B349 Viral infection, unspecified: Secondary | ICD-10-CM

## 2022-01-21 DIAGNOSIS — R051 Acute cough: Secondary | ICD-10-CM

## 2022-01-21 MED ORDER — FLUTICASONE PROPIONATE 50 MCG/ACT NA SUSP: 1.0000 | Freq: Every day | NASAL | 0 refills | Status: AC

## 2022-01-21 MED ORDER — BENZONATATE 100 MG PO CAPS: 100.0000 mg | ORAL_CAPSULE | Freq: Three times a day (TID) | ORAL | 0 refills | Status: DC | PRN

## 2022-01-21 NOTE — Discharge Instructions (Signed)
It appears that you have a viral illness that should run its course and self resolve with symptomatic treatment in the next few days.  If symptoms persist or worsen, please follow-up for further evaluation and management.  You have been sent 2 medications to help alleviate symptoms.

## 2022-01-21 NOTE — ED Triage Notes (Signed)
Pt is present today with c/o sore throat and cough. Pt sx started Tuesday

## 2022-01-21 NOTE — ED Provider Notes (Signed)
[facility]    CSN: [id number] Arrival date & time: 01/21/22  0946      History   Chief Complaint Chief Complaint  Patient presents with   Cough   Sore Throat    HPI Kari [name] is a 71 y.o. female.   Patient presents with sore throat and cough that started about 5 to 6 days ago.  Denies runny nose, nasal congestion, fever, any known sick contacts.  Denies chest pain, shortness of breath, nausea, vomiting, diarrhea, abdominal pain.  Patient has taken NyQuil with minimal improvement of symptoms.  Denies history of asthma or COPD but does report history of bronchitis.  Patient is not a smoker. Patient took at covid test at home that was negative.    Cough Sore Throat    Past Medical History:  Diagnosis Date   Arthritis    Complication of anesthesia    when block attempted ax -turned red in preop-surg delayed   Fibromyalgia    GERD (gastroesophageal reflux disease)    Hypertension    Hypothyroidism    MVP (mitral valve prolapse)    Osteoporosis    Rheumatoid aortitis    ? arthritis    Patient Active Problem List   Diagnosis Date Noted   Rheumatoid arthritis with rheumatoid factor of multiple sites without organ or systems involvement ([place]) 10/17/2020   High risk medication use 10/17/2020   History of total replacement of both hip joints 10/17/2020   DDD (degenerative disc disease), lumbar 10/17/2020   Myofascial pain syndrome 10/17/2020   Age-related osteoporosis without current pathological fracture 10/17/2020   Frequent falls 10/17/2020   Hyperlipidemia 07/14/2015   Palpitation 05/11/2014   PVC (premature ventricular contraction) 05/11/2014   Hypokalemia 05/11/2014   Essential hypertension 05/11/2014   Mitral regurgitation 05/11/2014   Arthritis, hip 02/03/2013    Past Surgical History:  Procedure Laterality Date   ABDOMINAL HYSTERECTOMY     CESAREAN SECTION     CHOLECYSTECTOMY     COLONOSCOPY     JOINT REPLACEMENT     KNEE ARTHROSCOPY  Right 2/11   TOTAL HIP ARTHROPLASTY Left 02/03/2013   Procedure: TOTAL HIP ARTHROPLASTY;  Surgeon: Kerin Salen, MD;  Location: [place];  Service: Orthopedics;  Laterality: Left;   TOTAL HIP ARTHROPLASTY Right 09/22/2020   Dr. Nicholes Calamity NERVE TRANSPOSITION  09/17/2011   Procedure: ULNAR NERVE DECOMPRESSION/TRANSPOSITION;  Surgeon: Cammie Sickle., MD;  Location: [place];  Service: Orthopedics;  Laterality: Left;  decompression   WRIST ARTHROSCOPY Right    x2-fusion     WRIST ARTHROSCOPY Left     OB History   No obstetric history on file.      Home Medications    Prior to Admission medications   Medication Sig Start Date End Date Taking? Authorizing Provider  benzonatate (TESSALON) 100 MG capsule Take 1 capsule (100 mg total) by mouth every 8 (eight) hours as needed for cough. 01/21/22  Yes , Michele Rockers, FNP  fluticasone (FLONASE) 50 MCG/ACT nasal spray Place 1 spray into both nostrils daily for 3 days. 01/21/22 01/24/22 Yes , Michele Rockers, FNP  amLODipine (NORVASC) 5 MG tablet Take 5 mg by mouth daily.    [provider]  Calcium-Vitamin D (CALTRATE 600 PLUS-VIT D PO) Take 1 tablet by mouth 2 (two) times daily.    [provider]  cholecalciferol (VITAMIN D) 1000 UNITS tablet Take 1,000 Units by mouth daily.    [provider]  cyclobenzaprine (FLEXERIL) 10 MG tablet Take 10 mg by mouth as needed.  10/20/13   [provider]  diclofenac Sodium (VOLTAREN) 1 % GEL Apply topically.    [provider]  eletriptan (RELPAX) 20 MG tablet as needed.  06/22/19   [provider]  ENBREL SURECLICK 50 MG/ML injection Inject 50 mg into the skin once a week. 01/04/22   Ofilia Neas, PA-C  gabapentin (NEURONTIN) 300 MG capsule TAKE FOUR CAPSULES BY MOUTH DAILY 01/04/22   Ofilia Neas, PA-C  leflunomide (ARAVA) 10 MG tablet Take 1 tablet (10 mg total) by mouth daily. 01/04/22   Ofilia Neas, PA-C  levothyroxine (SYNTHROID) 75  MCG tablet Take 75 mcg by mouth every morning. 07/06/19   [provider]  losartan (COZAAR) 100 MG tablet TAKE ONE TABLET BY MOUTH ONCE DAILY 06/29/21   Jerline Pain, MD  Multiple Vitamin (MULTIVITAMIN WITH MINERALS) TABS Take 1 tablet by mouth daily.    [provider]  nadolol (CORGARD) 40 MG tablet Take 1 tablet (40 mg total) by mouth every morning. 11/05/21   Jerline Pain, MD  Omega-3 Fatty Acids (FISH OIL PO) Take 1 capsule by mouth daily.    [provider]  ondansetron (ZOFRAN) 4 MG tablet TAKE ONE TABLET BY MOUTH EVERY 8 HOURS AS NEEDED FOR NAUSEA AND VOMITING 11/16/19   Bo Merino, MD  OVER THE COUNTER MEDICATION at bedtime. CBD Patient not taking: Reported on 03/20/2021    [provider]  pantoprazole (PROTONIX) 40 MG tablet Take 40 mg by mouth 2 (two) times daily.     [provider]  PROAIR HFA 108 (90 BASE) MCG/ACT inhaler as needed. 04/25/14   [provider]  rosuvastatin (CRESTOR) 20 MG tablet Take 1 tablet (20 mg total) by mouth daily. Please keep upcoming appt. With Dr. Marlou Porch in Aug. In order to receive future refills. Thank You. 12/24/21   Jerline Pain, MD  spironolactone (ALDACTONE) 25 MG tablet Take 1 tablet (25 mg total) by mouth daily. Please keep upcoming appt. With Dr. Marlou Porch in Aug. In order to receive future refills. Thank You. 12/24/21   Jerline Pain, MD  sucralfate (CARAFATE) 1 g tablet Take 1 g by mouth as needed (stomach).  05/27/16   [provider]  TYRVAYA 0.03 MG/ACT SOLN Place 1 spray into both nostrils 2 (two) times daily. 02/05/21   [provider]  valACYclovir (VALTREX) 1000 MG tablet as needed. 09/21/18   [provider]    Family History Family History  Problem Relation Age of Onset   Heart attack Father    Heart disease Mother    Stroke Mother    Colon cancer Other    Colon polyps Other    Hypothyroidism Son     Social History Social History   Tobacco Use    Smoking status: Never    Passive exposure: Never   Smokeless tobacco: Never  Vaping Use   Vaping Use: Never used  Substance Use Topics   Alcohol use: Yes    Comment: rarely    Drug use: No     Allergies   Humira [adalimumab], Clinoril [sulindac], Erythromycin, Hydrocodone, Ibuprofen, Penicillins, Plaquenil [hydroxychloroquine], Sulfa antibiotics, Trimethoprim, and Vancomycin cross reactors   Review of Systems Review of Systems Per HPI  Physical Exam Triage Vital Signs ED Triage Vitals [01/21/22 1126]  Enc Vitals Group     BP 139/82     Pulse Rate (!) 53  Resp 16     Temp 97.6 F (36.4 C)     Temp src      SpO2 97 %     Weight      Height      Head Circumference      Peak Flow      Pain Score 4     Pain Loc      Pain Edu?      Excl. in [place]?    No data found.  Updated Vital Signs BP 139/82   Pulse (!) 53   Temp 97.6 F (36.4 C)   Resp 16   SpO2 97%   Visual Acuity Right Eye Distance:   Left Eye Distance:   Bilateral Distance:    Right Eye Near:   Left Eye Near:    Bilateral Near:     Physical Exam Constitutional:      General: She is not in acute distress.    Appearance: Normal appearance. She is not toxic-appearing or diaphoretic.  HENT:     Head: Normocephalic and atraumatic.     Right Ear: Tympanic membrane and ear canal normal.     Left Ear: Tympanic membrane and ear canal normal.     Nose: Congestion present.     Mouth/Throat:     Mouth: Mucous membranes are moist.     Pharynx: Posterior oropharyngeal erythema present. No pharyngeal swelling, oropharyngeal exudate or uvula swelling.     Tonsils: No tonsillar exudate or tonsillar abscesses.  Eyes:     Extraocular Movements: Extraocular movements intact.     Conjunctiva/sclera: Conjunctivae normal.     Pupils: Pupils are equal, round, and reactive to light.  Cardiovascular:     Rate and Rhythm: Normal rate and regular rhythm.     Pulses: Normal pulses.     Heart sounds: Normal heart  sounds.  Pulmonary:     Effort: Pulmonary effort is normal. No respiratory distress.     Breath sounds: Normal breath sounds. No stridor. No wheezing, rhonchi or rales.  Abdominal:     General: Abdomen is flat. Bowel sounds are normal.     Palpations: Abdomen is soft.  Musculoskeletal:        General: Normal range of motion.     Cervical back: Normal range of motion.  Skin:    General: Skin is warm and dry.  Neurological:     General: No focal deficit present.     Mental Status: She is alert and oriented to person, place, and time. Mental status is at baseline.  Psychiatric:        Mood and Affect: Mood normal.        Behavior: Behavior normal.      UC Treatments / Results  Labs (all labs ordered are listed, but only abnormal results are displayed) Labs Reviewed - No data to display  EKG   Radiology No results found.  Procedures Procedures (including critical care time)  Medications Ordered in UC Medications - No data to display  Initial Impression / Assessment and Plan / UC Course  I have reviewed the triage vital signs and the nursing notes.  Pertinent labs & imaging results that were available during my care of the patient were reviewed by me and considered in my medical decision making (see chart for details).     Patient presents with symptoms likely from a viral upper respiratory infection. Differential includes bacterial pneumonia, sinusitis, allergic rhinitis, COVID-19, flu. Do not suspect underlying cardiopulmonary process. Symptoms  seem unlikely related to ACS, CHF or COPD exacerbations, pneumonia, pneumothorax. Patient is nontoxic appearing and not in need of emergent medical intervention.  No suspicion for strep throat given appearance of posterior pharynx on exam so will defer testing.  Will defer COVID testing as well given that patient had a negative home COVID test.  Patient was offered COVID testing but declined.  Recommended symptom control with  medications.  Patient sent prescriptions.  Return if symptoms fail to improve in 1-2 weeks or you develop shortness of breath, chest pain, severe headache. Patient states understanding and is agreeable.  Discharged with PCP followup.  Final Clinical Impressions(s) / UC Diagnoses   Final diagnoses:  Viral illness  Acute cough     Discharge Instructions      It appears that you have a viral illness that should run its course and self resolve with symptomatic treatment in the next few days.  If symptoms persist or worsen, please follow-up for further evaluation and management.  You have been sent 2 medications to help alleviate symptoms.    ED Prescriptions     Medication Sig Dispense Auth. Provider   fluticasone (FLONASE) 50 MCG/ACT nasal spray Place 1 spray into both nostrils daily for 3 days. 16 g , Hildred Alamin E, [place]   benzonatate (TESSALON) 100 MG capsule Take 1 capsule (100 mg total) by mouth every 8 (eight) hours as needed for cough. 21 capsule [place], Michele Rockers, [place]      PDMP not reviewed this encounter.   Teodora Medici, [place] 01/21/22 1235

## 2022-01-21 NOTE — Progress Notes (Unsigned)
Office Visit Note  Patient: Kari [name]             Date of Birth: 1951-04-14           MRN: [id number]             PCP: Pcp, No Referring: Aretta Nip, MD Visit Date: 01/28/2022 Occupation: '@GUAROCC'$ @  Subjective:  Pain in both elbows   History of Present Illness: Kari [name] is a 71 y.o. female with history of seropositive rheumatoid arthritis, myofascial pain, and osteoporosis.  She is on enbrel 50 mg sq injections once weekly and arava 10 mg 1 tablet by mouth daily. She is tolerating combination therapy without any side effects.  She denies any recent rheumatoid arthritis flares.  She states for the past 1 week the [facility] in her house is has not been working so she has noticed increased fluid retention and stiffness/tightness in her hands and feet.  She has been experiencing ongoing pain in both elbows.  She previously had a NCV with EMG performed by Dr. Ron Agee.  She has been under the care of Dr. Grandville Silos.  She had an ultrasound guided left elbow injection recently with no improvement in her symptoms.  She has an upcoming follow up visit on 02/19/22.  She had a viral URI last week.      Activities of Daily Living:  Patient reports morning stiffness for 0 minutes.   Patient Reports nocturnal pain.  Difficulty dressing/grooming: Denies Difficulty climbing stairs: Reports Difficulty getting out of chair: Denies Difficulty using hands for taps, buttons, cutlery, and/or writing: Denies  Review of Systems  Constitutional:  Positive for fatigue.  HENT:  Negative for mouth sores and mouth dryness.   Eyes:  Positive for dryness.  Respiratory:  Negative for shortness of breath.   Cardiovascular:  Negative for chest pain and palpitations.  Gastrointestinal:  Negative for blood in stool, constipation and diarrhea.  Endocrine: Negative for increased urination.  Genitourinary:  Negative for involuntary urination.  Musculoskeletal:  Positive for joint pain and joint pain. Negative  for joint swelling, myalgias, muscle weakness, morning stiffness, muscle tenderness and myalgias.  Skin:  Positive for sensitivity to sunlight. Negative for color change, rash and hair loss.  Allergic/Immunologic: Negative for susceptible to infections.  Neurological:  Positive for parasthesias. Negative for dizziness and headaches.  Hematological:  Negative for swollen glands.  Psychiatric/Behavioral:  Negative for depressed mood and sleep disturbance. The patient is not nervous/anxious.     PMFS History:  Patient Active Problem List   Diagnosis Date Noted  . Rheumatoid arthritis with rheumatoid factor of multiple sites without organ or systems involvement ([place]) 10/17/2020  . High risk medication use 10/17/2020  . History of total replacement of both hip joints 10/17/2020  . DDD (degenerative disc disease), lumbar 10/17/2020  . Myofascial pain syndrome 10/17/2020  . Age-related osteoporosis without current pathological fracture 10/17/2020  . Frequent falls 10/17/2020  . Hyperlipidemia 07/14/2015  . Palpitation 05/11/2014  . PVC (premature ventricular contraction) 05/11/2014  . Hypokalemia 05/11/2014  . Essential hypertension 05/11/2014  . Mitral regurgitation 05/11/2014  . Arthritis, hip 02/03/2013    Past Medical History:  Diagnosis Date  . Arthritis   . Complication of anesthesia    when block attempted ax -turned red in preop-surg delayed  . Fibromyalgia   . GERD (gastroesophageal reflux disease)   . Hypertension   . Hypothyroidism   . MVP (mitral valve prolapse)   . Osteoporosis   . Rheumatoid  aortitis    ? arthritis    Family History  Problem Relation Age of Onset  . Heart attack Father   . Heart disease Mother   . Stroke Mother   . Colon cancer Other   . Colon polyps Other   . Hypothyroidism Son    Past Surgical History:  Procedure Laterality Date  . ABDOMINAL HYSTERECTOMY    . CESAREAN SECTION    . CHOLECYSTECTOMY    . COLONOSCOPY    . JOINT REPLACEMENT     . KNEE ARTHROSCOPY Right 2/11  . TOTAL HIP ARTHROPLASTY Left 02/03/2013   Procedure: TOTAL HIP ARTHROPLASTY;  Surgeon: Kerin Salen, MD;  Location: [place];  Service: Orthopedics;  Laterality: Left;  . TOTAL HIP ARTHROPLASTY Right 09/22/2020   Dr. Mayer Camel  . ULNAR NERVE TRANSPOSITION  09/17/2011   Procedure: ULNAR NERVE DECOMPRESSION/TRANSPOSITION;  Surgeon: Cammie Sickle., MD;  Location: [place];  Service: Orthopedics;  Laterality: Left;  decompression  . WRIST ARTHROSCOPY Right    x2-fusion    . WRIST ARTHROSCOPY Left    Social History   Social History Narrative  . Not on file   Immunization History  Administered Date(s) Administered  . PFIZER(Purple Top)SARS-COV-2 Vaccination 08/05/2019, 08/26/2019, 02/14/2020, 01/16/2021     Objective: Vital Signs: BP (!) 142/87 (BP Location: Left Arm, Patient Position: Sitting, Cuff Size: Large)   Pulse (!) 59   Resp 17   Ht '5\' 3"'$  (1.6 m)   Wt 199 lb (90.3 kg)   BMI 35.25 kg/m    Physical Exam Vitals and nursing note reviewed.  Constitutional:      Appearance: She is well-developed.  HENT:     Head: Normocephalic and atraumatic.  Eyes:     Conjunctiva/sclera: Conjunctivae normal.  Cardiovascular:     Rate and Rhythm: Normal rate and regular rhythm.     Heart sounds: Normal heart sounds.  Pulmonary:     Effort: Pulmonary effort is normal.     Breath sounds: Normal breath sounds.  Abdominal:     General: Bowel sounds are normal.     Palpations: Abdomen is soft.  Musculoskeletal:     Cervical back: Normal range of motion.  Skin:    General: Skin is warm and dry.     Capillary Refill: Capillary refill takes less than 2 seconds.  Neurological:     Mental Status: She is alert and oriented to person, place, and time.  Psychiatric:        Behavior: Behavior normal.     Musculoskeletal Exam: C-spine, thoracic spine, and lumbar spine good ROM.  Shoulder joints have good ROM.  Tenderness over the medial  epicondyle of both elbows, left >right.  Wrist joints, Mcps, PIPs, and DIPs good ROM with no synovitis.  Complete fist formation bilaterally.  Hip replacements have good ROM.  Knee joints and and ankle joints have good ROM with no discomfort.  No warmth or effusion of knee joints.  No tenderness or swelling of ankle joints.  No tenderness or synovitis of MTP joints.     CDAI Exam: CDAI Score: -- Patient Global: --; Provider Global: -- Swollen: --; Tender: -- Joint Exam 01/28/2022   No joint exam has been documented for this visit   There is currently no information documented on the homunculus. Go to the Rheumatology activity and complete the homunculus joint exam.  Investigation: No additional findings.  Imaging: No results found.  Recent Labs: Lab Results  Component Value Date  WBC 4.8 01/02/2022   HGB 11.9 01/02/2022   PLT 221 01/02/2022   NA 140 01/02/2022   K 4.0 01/02/2022   CL 105 01/02/2022   CO2 26 01/02/2022   GLUCOSE 102 (H) 01/02/2022   BUN 17 01/02/2022   CREATININE 1.17 (H) 01/02/2022   BILITOT 0.6 01/02/2022   ALKPHOS 72 01/20/2017   AST 25 01/02/2022   ALT 25 01/02/2022   PROT 6.4 01/02/2022   ALBUMIN 4.2 01/20/2017   CALCIUM 9.3 01/02/2022   GFRAA 72 12/04/2020   QFTBGOLDPLUS NEGATIVE 01/02/2022    Speciality Comments: Recieves Enbrel through [employer] Program  Procedures:  No procedures performed Allergies: Humira [adalimumab], Clinoril [sulindac], Erythromycin, Hydrocodone, Ibuprofen, Penicillins, Plaquenil [hydroxychloroquine], Sulfa antibiotics, Trimethoprim, and Vancomycin cross reactors   Assessment / Plan:     Visit Diagnoses: Rheumatoid arthritis with rheumatoid factor of multiple sites without organ or systems involvement ([place]): She has no joint tenderness or synovitis on examination today.  She has not had any signs or symptoms of a rheumatoid arthritis flare.  She has clinically been doing well on Enbrel 50 mg subcutaneous  injections once weekly and Arava 10 mg 1 tablet by mouth daily.  She is tolerating combination therapy without any side effects and has not missed any doses recently.  She has not been experiencing any morning stiffness or nocturnal pain.  She will remain on combination therapy as prescribed.  She was advised to notify us if she develops signs or symptoms of a rheumatoid arthritis flare.  She will follow-up in the office in 5 months or sooner if needed.  High risk medication use - Enbrel 50 mg sq injections once weekly and arava 10 mg 1 tablet by mouth daily.  CBC and CMP updated on 01/02/22.  She will be due to update CBC and CMP in October and every 3 months.  Standing orders for CBC and CMP remain in place.   TB gold negative on 01/02/22.  She had a recent upper respiratory viral infection.  She did not hold Enbrel and Arava during that time.  Discussed the importance of holding enbrel and arava if she develops signs or symptoms of an infection and to resume once the infection has completely cleared.  Discussed the importance of yearly skin exams while on enbrel.   Medial epicondylitis of both elbows - She continues to have chronic pain in both elbows.  She has been under the care of Dr. Grandville Silos.  She recently had an ultrasound-guided cortisone injection which did not improve her symptoms.  She has an upcoming appointment with Dr. Grandville Silos on 02/19/2022.  She previously underwent a NCV with EMG performed by Dr. Ron Agee. She continues to have symptoms concerning for possible ulnar nerve entrapment.   History of total replacement of both hip joints: Doing well.  She has good ROM with no groin pain.  Following up with Dr. Mayer Camel on a yearly basis.   DDD (degenerative disc disease), lumbar - Followed by Dr. Ron Agee. Previously had injections. No midline spinal tenderness.  No symptoms of radiculopathy.   Myofascial pain syndrome: She has intermittent myalgias and muscle tenderness.   Age-related  osteoporosis without current pathological fracture - DEXA 02/21/20: Right total hip BMD 0.703 with T-score -2.0. previously treated with fosamax and forteo (completed 2 years of treatment). Reclast IV on 06/18/2021.  Due to update DEXA in August 2023.  Order for DEXA placed today.  No recent falls or fractures.  She continues to take a calcium and  vitamin D supplement daily. - Plan: DG BONE DENSITY (DXA)  Vitamin D deficiency - She is taking vitamin D 1000 units daily.  Plan: DG BONE DENSITY (DXA)  Other medical conditions are listed as follows:   History of hyperlipidemia  History of hypertension  History of hypothyroidism  History of neuropathy - She remains on gabapentin 300 mg 4 capsules daily.   Orders: Orders Placed This Encounter  Procedures  . DG BONE DENSITY (DXA)   No orders of the defined types were placed in this encounter.  Follow-Up Instructions: Return in about 5 months (around 06/30/2022) for Rheumatoid arthritis, Osteoporosis.   Ofilia Neas, PA-C  Note - This record has been created using Dragon software.  Chart creation errors have been sought, but may not always  have been located. Such creation errors do not reflect on  the standard of medical care.

## 2022-01-22 ENCOUNTER — Ambulatory Visit: Payer: PPO | Admitting: Physician Assistant

## 2022-01-22 DIAGNOSIS — Z8639 Personal history of other endocrine, nutritional and metabolic disease: Secondary | ICD-10-CM

## 2022-01-22 DIAGNOSIS — Z8679 Personal history of other diseases of the circulatory system: Secondary | ICD-10-CM

## 2022-01-22 DIAGNOSIS — M810 Age-related osteoporosis without current pathological fracture: Secondary | ICD-10-CM

## 2022-01-22 DIAGNOSIS — Z8669 Personal history of other diseases of the nervous system and sense organs: Secondary | ICD-10-CM

## 2022-01-22 DIAGNOSIS — M7918 Myalgia, other site: Secondary | ICD-10-CM

## 2022-01-22 DIAGNOSIS — M0579 Rheumatoid arthritis with rheumatoid factor of multiple sites without organ or systems involvement: Secondary | ICD-10-CM

## 2022-01-22 DIAGNOSIS — E559 Vitamin D deficiency, unspecified: Secondary | ICD-10-CM

## 2022-01-22 DIAGNOSIS — M7701 Medial epicondylitis, right elbow: Secondary | ICD-10-CM

## 2022-01-22 DIAGNOSIS — Z79899 Other long term (current) drug therapy: Secondary | ICD-10-CM

## 2022-01-22 DIAGNOSIS — Z96643 Presence of artificial hip joint, bilateral: Secondary | ICD-10-CM

## 2022-01-22 DIAGNOSIS — M5136 Other intervertebral disc degeneration, lumbar region: Secondary | ICD-10-CM

## 2022-01-28 ENCOUNTER — Ambulatory Visit: Payer: PPO | Attending: Physician Assistant | Admitting: Physician Assistant

## 2022-01-28 ENCOUNTER — Encounter: Payer: Self-pay | Admitting: Physician Assistant

## 2022-01-28 VITALS — BP 142/87 | HR 59 | Resp 17 | Ht 63.0 in | Wt 199.0 lb

## 2022-01-28 DIAGNOSIS — Z8679 Personal history of other diseases of the circulatory system: Secondary | ICD-10-CM | POA: Diagnosis not present

## 2022-01-28 DIAGNOSIS — M0579 Rheumatoid arthritis with rheumatoid factor of multiple sites without organ or systems involvement: Secondary | ICD-10-CM | POA: Diagnosis not present

## 2022-01-28 DIAGNOSIS — E559 Vitamin D deficiency, unspecified: Secondary | ICD-10-CM | POA: Diagnosis not present

## 2022-01-28 DIAGNOSIS — M5136 Other intervertebral disc degeneration, lumbar region: Secondary | ICD-10-CM

## 2022-01-28 DIAGNOSIS — M810 Age-related osteoporosis without current pathological fracture: Secondary | ICD-10-CM | POA: Diagnosis not present

## 2022-01-28 DIAGNOSIS — Z8669 Personal history of other diseases of the nervous system and sense organs: Secondary | ICD-10-CM | POA: Diagnosis not present

## 2022-01-28 DIAGNOSIS — M7701 Medial epicondylitis, right elbow: Secondary | ICD-10-CM

## 2022-01-28 DIAGNOSIS — Z79899 Other long term (current) drug therapy: Secondary | ICD-10-CM

## 2022-01-28 DIAGNOSIS — M7702 Medial epicondylitis, left elbow: Secondary | ICD-10-CM | POA: Diagnosis not present

## 2022-01-28 DIAGNOSIS — M7918 Myalgia, other site: Secondary | ICD-10-CM

## 2022-01-28 DIAGNOSIS — Z96643 Presence of artificial hip joint, bilateral: Secondary | ICD-10-CM | POA: Diagnosis not present

## 2022-01-28 DIAGNOSIS — Z8639 Personal history of other endocrine, nutritional and metabolic disease: Secondary | ICD-10-CM | POA: Diagnosis not present

## 2022-01-28 NOTE — Patient Instructions (Signed)
Standing Labs We placed an order today for your standing lab work.   Please have your standing labs drawn in October and every 3 months   If possible, please have your labs drawn 2 weeks prior to your appointment so that the provider can discuss your results at your appointment.  Please note that you may see your imaging and lab results in MyChart before we have reviewed them. We may be awaiting multiple results to interpret others before contacting you. Please allow our office up to 72 hours to thoroughly review all of the results before contacting the office for clarification of your results.  We have open lab daily: Monday through Thursday from 1:30-4:30 PM and Friday from 1:30-4:00 PM at the office of Dr. Shaili Deveshwar, [facility].   Please be advised, all patients with office appointments requiring lab work will take precedent over walk-in lab work.  If possible, please come for your lab work on Monday and Friday afternoons, as you may experience shorter wait times. The office is located at [address], [address], , [place] [address] No appointment is necessary.   Labs are drawn by [facility]. Please bring your co-pay at the time of your lab draw.  You may receive a bill from [facility] for your lab work.  Please note if you are on Hydroxychloroquine and and an order has been placed for a Hydroxychloroquine level, you will need to have it drawn 4 hours or more after your last dose.  If you wish to have your labs drawn at another location, please call the office 24 hours in advance to send orders.  If you have any questions regarding directions or hours of operation,  please call [phone].   As a reminder, please drink plenty of water prior to coming for your lab work. Thanks!    

## 2022-02-08 ENCOUNTER — Ambulatory Visit: Payer: PPO | Admitting: Cardiology

## 2022-02-08 ENCOUNTER — Encounter: Payer: Self-pay | Admitting: Cardiology

## 2022-02-08 VITALS — BP 128/70 | HR 57 | Ht 63.0 in | Wt 197.2 lb

## 2022-02-08 DIAGNOSIS — R072 Precordial pain: Secondary | ICD-10-CM

## 2022-02-08 DIAGNOSIS — Z01812 Encounter for preprocedural laboratory examination: Secondary | ICD-10-CM

## 2022-02-08 DIAGNOSIS — E7800 Pure hypercholesterolemia, unspecified: Secondary | ICD-10-CM

## 2022-02-08 DIAGNOSIS — I10 Essential (primary) hypertension: Secondary | ICD-10-CM | POA: Diagnosis not present

## 2022-02-08 DIAGNOSIS — R0602 Shortness of breath: Secondary | ICD-10-CM | POA: Diagnosis not present

## 2022-02-08 LAB — BASIC METABOLIC PANEL
BUN/Creatinine Ratio: 17 (ref 12–28)
BUN: 20 mg/dL (ref 8–27)
CO2: 22 mmol/L (ref 20–29)
Calcium: 9.4 mg/dL (ref 8.7–10.3)
Chloride: 102 mmol/L (ref 96–106)
Creatinine, Ser: 1.2 mg/dL — ABNORMAL HIGH (ref 0.57–1.00)
Glucose: 93 mg/dL (ref 70–99)
Potassium: 4.2 mmol/L (ref 3.5–5.2)
Sodium: 140 mmol/L (ref 134–144)
eGFR: 48 mL/min/{1.73_m2} — ABNORMAL LOW (ref >59)

## 2022-02-08 NOTE — Patient Instructions (Signed)
Medication Instructions:  The current medical regimen is effective;  continue present plan and medications.  *If you need a refill on your cardiac medications before your next appointment, please call your pharmacy*   Lab Work: Please have blood work today (BMP)  If you have labs (blood work) drawn today and your tests are completely normal, you will receive your results only by: MyChart Message (if you have MyChart) OR A paper copy in the mail If you have any lab test that is abnormal or we need to change your treatment, we will call you to review the results.   Testing/Procedures: Your physician has requested that you have an echocardiogram. Echocardiography is a painless test that uses sound waves to create images of your heart. It provides your doctor with information about the size and shape of your heart and how well your heart's chambers and valves are working. This procedure takes approximately one hour. There are no restrictions for this procedure.    Your cardiac CT will be scheduled at:   [facility] [address] [place], [place] [address] [phone]  Please arrive at the [facility] and Children's Entrance (Entrance C2) of [facility] 30 minutes prior to test start time. You can use the FREE valet parking offered at entrance C (encouraged to control the heart rate for the test)  Proceed to the [facility] Radiology Department (first floor) to check-in and test prep.  All radiology patients and guests should use entrance C2 at [facility], accessed from [facility], even though the hospital's physical address listed is [address]    Please follow these instructions carefully (unless otherwise directed):  Hold all erectile dysfunction medications at least 3 days (72 hrs) prior to test.  On the Night Before the Test: Be sure to Drink plenty of water. Do not consume any caffeinated/decaffeinated beverages or  chocolate 12 hours prior to your test. Do not take any antihistamines 12 hours prior to your test.  On the Day of the Test: Drink plenty of water until 1 hour prior to the test. Do not eat any food 4 hours prior to the test. You may take your regular medications prior to the test.  Take metoprolol (Lopressor) two hours prior to test. HOLD Furosemide/Hydrochlorothiazide morning of the test. FEMALES- please wear underwire-free bra if available, avoid dresses & tight clothing      After the Test: Drink plenty of water. After receiving IV contrast, you may experience a mild flushed feeling. This is normal. On occasion, you may experience a mild rash up to 24 hours after the test. This is not dangerous. If this occurs, you can take Benadryl 25 mg and increase your fluid intake. If you experience trouble breathing, this can be serious. If it is severe call 911 IMMEDIATELY. If it is mild, please call our office. If you take any of these medications: Glipizide/Metformin, Avandament, Glucavance, please do not take 48 hours after completing test unless otherwise instructed.  We will call to schedule your test 2-4 weeks out understanding that some insurance companies will need an authorization prior to the service being performed.   For non-scheduling related questions, please contact the cardiac imaging nurse navigator should you have any questions/concerns: Marchia Bond, Cardiac Imaging Nurse Navigator Gordy Clement, Cardiac Imaging Nurse Navigator [facility] Direct Office Dial: [phone]   For scheduling needs, including cancellations and rescheduling, please call [place], [phone].  Follow-Up: At [facility], you  and your health needs are our priority.  As part of our continuing mission to provide you with exceptional heart care, we have created designated Provider Care Teams.  These Care Teams include your primary Cardiologist (physician) and Advanced  Practice Providers (APPs -  Physician Assistants and Nurse Practitioners) who all work together to provide you with the care you need, when you need it.  We recommend signing up for the patient portal called "MyChart".  Sign up information is provided on this After Visit Summary.  MyChart is used to connect with patients for Virtual Visits (Telemedicine).  Patients are able to view lab/test results, encounter notes, upcoming appointments, etc.  Non-urgent messages can be sent to your provider as well.   To learn more about what you can do with MyChart, go to [email or web address].    Your next appointment:   6 month(s)  The format for your next appointment:   In Person  Provider:   Robbie Lis, PA-C, Nicholes Rough, PA-C, Christen Bame, NP, or Richardson Dopp, PA-C     Then, Candee Furbish, MD will plan to see you again in 1 year(s).{   Important Information About Sugar

## 2022-02-08 NOTE — Progress Notes (Signed)
Cardiology Office Note:    Date:  02/08/2022   ID:  Kari [name], DOB 10/24/1950, MRN [id number]  PCP:  Kathyrn Lass   [facility] Providers Cardiologist:  Candee Furbish, MD    Referring MD: Aretta Nip, MD     History of Present Illness:    Kari [name] is a 71 y.o. female here for the follow up of hypertension,and mitral regurgitation.  Comes in today with complaints of shortness of breath with activity as well as chest discomfort.  Recently seen in urgent care 01/21/2022 with concerns for viral illness. At home COVID-19 test was negative.  She called the office 12/25/2021 reporting chest pain over the prior month, as well as dizziness with bending over and SOB with activity. Reported MD wonders if RA has settled into chest wall and this could be causing CP.  She had an OV with PCP that same day at 11 am to address complaint. BP 126/55-61 on 12/24/21.  Reported medications were the same except PCP ordered amlodipine 5 mg PO QD on 10/08/21. She was advised to go to her scheduled OV and she was scheduled to see DOD on 12/27/21.  Previously seen by hypertension clinic.  Excellent.  Has a family history of stroke and myocardial infarction.  Echocardiogram previously reviewed shows normal ejection fraction. No evidence of mitral valve prolapse.  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  Her son enjoys anime.  She would like to go on a bullet train in [place].  She is also hopeful for a cruise around [place] and [place].  She was planning to go down to the [place] soon.  At her last appointment she reported nocturnal palpitations while lying down, not uncommon. Did not feel them usually during the day; she denied associated shortness of breath, chest pain, or syncope. She noted improvement in palpitations after starting spironolactone. No medication changes were made.  Today: She reports becoming fatigued easily, such as when walking from the parking lot to this  office. Her chest began hurting, and it was difficult to catch her breath. She describes that "her heart felt like it was coming out of her chest." These symptoms have been gradually worsening.  Recently she went on a trip to [place]. With her symptoms as noted above she had a lot of difficulty with walking on cobbled paths and through large castles. She notes that if she walks slowly and steadily, her symptoms are usually not as severe.  On 03/01/22 she is scheduled to receive more injections in her back. Her back pain continues to significantly limit her formal exercise.  Lately she has noticed some weight gain. She has been working on being more conscientious of her diet.  She confirms no prior history of smoking.  She denies any peripheral edema. No lightheadedness, headaches, syncope, orthopnea, or PND.   Past Medical History:  Diagnosis Date   Arthritis    Complication of anesthesia    when block attempted ax -turned red in preop-surg delayed   Fibromyalgia    GERD (gastroesophageal reflux disease)    Hypertension    Hypothyroidism    MVP (mitral valve prolapse)    Osteoporosis    Rheumatoid aortitis    ? arthritis    Past Surgical History:  Procedure Laterality Date   ABDOMINAL HYSTERECTOMY     CESAREAN SECTION     CHOLECYSTECTOMY     COLONOSCOPY     JOINT REPLACEMENT     KNEE ARTHROSCOPY Right  2/11   TOTAL HIP ARTHROPLASTY Left 02/03/2013   Procedure: TOTAL HIP ARTHROPLASTY;  Surgeon: Kerin Salen, MD;  Location: [place];  Service: Orthopedics;  Laterality: Left;   TOTAL HIP ARTHROPLASTY Right 09/22/2020   Dr. Nicholes Calamity NERVE TRANSPOSITION  09/17/2011   Procedure: ULNAR NERVE DECOMPRESSION/TRANSPOSITION;  Surgeon: Cammie Sickle., MD;  Location: [place];  Service: Orthopedics;  Laterality: Left;  decompression   WRIST ARTHROSCOPY Right    x2-fusion     WRIST ARTHROSCOPY Left     Current Medications: Current Meds  Medication Sig    amLODipine (NORVASC) 5 MG tablet Take 5 mg by mouth daily.   benzonatate (TESSALON) 100 MG capsule Take 1 capsule (100 mg total) by mouth every 8 (eight) hours as needed for cough.   Calcium-Vitamin D (CALTRATE 600 PLUS-VIT D PO) Take 1 tablet by mouth 2 (two) times daily.   cholecalciferol (VITAMIN D) 1000 UNITS tablet Take 1,000 Units by mouth daily.   cyclobenzaprine (FLEXERIL) 10 MG tablet Take 10 mg by mouth as needed.    diclofenac Sodium (VOLTAREN) 1 % GEL Apply topically.   eletriptan (RELPAX) 20 MG tablet as needed.    ENBREL SURECLICK 50 MG/ML injection Inject 50 mg into the skin once a week.   gabapentin (NEURONTIN) 300 MG capsule TAKE FOUR CAPSULES BY MOUTH DAILY   leflunomide (ARAVA) 10 MG tablet Take 1 tablet (10 mg total) by mouth daily.   levothyroxine (SYNTHROID) 75 MCG tablet Take 75 mcg by mouth every morning.   losartan (COZAAR) 100 MG tablet TAKE ONE TABLET BY MOUTH ONCE DAILY   Multiple Vitamin (MULTIVITAMIN WITH MINERALS) TABS Take 1 tablet by mouth daily.   nadolol (CORGARD) 40 MG tablet Take 1 tablet (40 mg total) by mouth every morning.   Omega-3 Fatty Acids (FISH OIL PO) Take 1 capsule by mouth daily.   ondansetron (ZOFRAN) 4 MG tablet TAKE ONE TABLET BY MOUTH EVERY 8 HOURS AS NEEDED FOR NAUSEA AND VOMITING   OVER THE COUNTER MEDICATION at bedtime. CBD   pantoprazole (PROTONIX) 40 MG tablet Take 40 mg by mouth 2 (two) times daily.    PROAIR HFA 108 (90 BASE) MCG/ACT inhaler as needed.   rosuvastatin (CRESTOR) 20 MG tablet Take 1 tablet (20 mg total) by mouth daily. Please keep upcoming appt. With Dr. Marlou Porch in Aug. In order to receive future refills. Thank You.   spironolactone (ALDACTONE) 25 MG tablet Take 1 tablet (25 mg total) by mouth daily. Please keep upcoming appt. With Dr. Marlou Porch in Aug. In order to receive future refills. Thank You.   sucralfate (CARAFATE) 1 g tablet Take 1 g by mouth as needed (stomach).    TYRVAYA 0.03 MG/ACT SOLN Place 1 spray into both  nostrils 2 (two) times daily.   valACYclovir (VALTREX) 1000 MG tablet as needed.     Allergies:   Humira [adalimumab], Clinoril [sulindac], Erythromycin, Hydrocodone, Ibuprofen, Penicillins, Plaquenil [hydroxychloroquine], Sulfa antibiotics, Trimethoprim, and Vancomycin cross reactors   Social History   Socioeconomic History   Marital status: Widowed    Spouse name: Not on file   Number of children: Not on file   Years of education: Not on file   Highest education level: Not on file  Occupational History   Not on file  Tobacco Use   Smoking status: Never    Passive exposure: Never   Smokeless tobacco: Never  Vaping Use   Vaping Use: Never used  Substance and Sexual Activity  Alcohol use: Yes    Comment: rarely    Drug use: No   Sexual activity: Not on file  Other Topics Concern   Not on file  Social History Narrative   Not on file   Social Determinants of Health   Financial Resource Strain: Not on file  Food Insecurity: Not on file  Transportation Needs: Not on file  Physical Activity: Not on file  Stress: Not on file  Social Connections: Not on file     Family History: The patient's family history includes Colon cancer in an other family member; Colon polyps in an other family member; Heart attack in her father; Heart disease in her mother; Hypothyroidism in her son; Stroke in her mother.  ROS:   Please see the history of present illness.    (+) Fatigue (+) Chest pain (+) Shortness of breath (+) Palpitations (+) Back pain All other systems reviewed and are negative.  EKGs/Labs/Other Studies Reviewed:    The following studies were reviewed today:   Echocardiogram 11/08/2019:   1. Left ventricular ejection fraction, by estimation, is 60 to 65%. The  left ventricle has normal function. The left ventricle has no regional  wall motion abnormalities. There is moderate left ventricular hypertrophy  of the basal-septal segment. Left  ventricular diastolic  parameters are consistent with Grade I diastolic  dysfunction (impaired relaxation). Elevated left ventricular end-diastolic  pressure.   2. Right ventricular systolic function is normal. The right ventricular  size is normal.   3. No evidence of mitral valve prolapse. The mitral valve is normal in  structure. No evidence of mitral valve regurgitation. No evidence of  mitral stenosis.   4. The aortic valve is tricuspid. Aortic valve regurgitation is not  visualized. Mild aortic valve sclerosis is present, with no evidence of  aortic valve stenosis.   5. The inferior vena cava is normal in size with greater than 50%  respiratory variability, suggesting right atrial pressure of 3 mmHg.    EKG:  EKG is personally reviewed. 02/08/2022:  Sinus bradycardia. Rate 57 bpm. 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 LAFB. Nonspecific ST changes. 01/12/2021: sinus rhythm 60 left anterior fascicular block poor R wave progression  Recent Labs: 01/02/2022: ALT 25; BUN 17; Creat 1.17; Hemoglobin 11.9; Platelets 221; Potassium 4.0; Sodium 140   Recent Lipid Panel No results found for: "CHOL", "TRIG", "HDL", "CHOLHDL", "VLDL", "LDLCALC", "LDLDIRECT"   Risk Assessment/Calculations:          Physical Exam:    VS:  BP 128/70   Pulse (!) 57   Ht '5\' 3"'$  (1.6 m)   Wt 197 lb 3.2 oz (89.4 kg)   SpO2 99%   BMI 34.93 kg/m     Wt Readings from Last 3 Encounters:  02/08/22 197 lb 3.2 oz (89.4 kg)  01/28/22 199 lb (90.3 kg)  10/02/21 198 lb (89.8 kg)     GEN:  Well nourished, well developed in no acute distress HEENT: Normal NECK: No JVD; No carotid bruits LYMPHATICS: No lymphadenopathy CARDIAC: RRR, no murmurs, rubs, gallops RESPIRATORY:  Clear to auscultation without rales, wheezing or rhonchi  ABDOMEN: Soft, non-tender, non-distended MUSCULOSKELETAL:  No edema; No deformity  SKIN: Warm and dry NEUROLOGIC:  Alert and oriented x 3 PSYCHIATRIC:  Normal affect   ASSESSMENT:    1. Precordial chest pain    2. Shortness of breath   3. Essential hypertension   4. Pure hypercholesterolemia   5. Pre-procedure lab exam     PLAN:  In order of problems listed above:  Precordial chest pain - We will go ahead and check a coronary CT scan with possible FFR analysis.  Certainly the symptoms could be secondary to coronary blood flow abnormality.  Shortness of breath - We will check an echocardiogram since it has been over 2 years.  We will make sure that she has proper structure and function.  If these tests are unremarkable, deconditioning certainly could be playing a role.  She notes her symptoms quite dramatically when traveling to [place].  Difficult for her to walk the distances.  She does have ongoing back pain, rheumatoid arthritis which limits her ability to exercise well.  She gets injections periodically.  Palpitations - Has had what seemed like PVCs by history.   She is also on Corgard or nadolol 40 mg every morning, beta-blocker.   No change in her medical management.  Refill beta-blocker as needed.  Rheumatoid arthritis - On leflunomide.  Rheumatoid modulator.  Excellent.  Per primary team.  Monitored by rheumatology.  Right hip replaced March 2022  Essential hypertension - Overall well controlled with losartan 100 mg a day nadolol 40 mg a day, spironolactone 25 mg a day.  Refills as needed for this medical management.  No changes made today.  Prior echocardiogram showed no evidence of mitral valve prolapse.  Very soft murmur noted on exam.  Hyperlipidemia - Continue with Crestor 20 mg a day LDL 75 no changes made.  No myalgias.  Follow-up:  6 months with APP.   Medication Adjustments/Labs and Tests Ordered: Current medicines are reviewed at length with the patient today.  Concerns regarding medicines are outlined above.   Orders Placed This Encounter  Procedures   CT CORONARY MORPH W/CTA COR W/SCORE W/CA W/CM &/OR WO/CM   Basic metabolic panel   EKG [id number]    ECHOCARDIOGRAM COMPLETE   No orders of the defined types were placed in this encounter.  Patient Instructions  Medication Instructions:  The current medical regimen is effective;  continue present plan and medications.  *If you need a refill on your cardiac medications before your next appointment, please call your pharmacy*   Lab Work: Please have blood work today (BMP)  If you have labs (blood work) drawn today and your tests are completely normal, you will receive your results only by: MyChart Message (if you have MyChart) OR A paper copy in the mail If you have any lab test that is abnormal or we need to change your treatment, we will call you to review the results.   Testing/Procedures: Your physician has requested that you have an echocardiogram. Echocardiography is a painless test that uses sound waves to create images of your heart. It provides your doctor with information about the size and shape of your heart and how well your heart's chambers and valves are working. This procedure takes approximately one hour. There are no restrictions for this procedure.    Your cardiac CT will be scheduled at:   [facility] [address] [place], [place] [address] [phone]  Please arrive at the [facility] and Children's Entrance (Entrance C2) of [facility] 30 minutes prior to test start time. You can use the FREE valet parking offered at entrance C (encouraged to control the heart rate for the test)  Proceed to the [facility] Radiology Department (first floor) to check-in and test prep.  All radiology patients and guests should use entrance C2 at [facility], accessed from [facility]  Street, even though the hospital's physical address listed is [address].    Please follow these instructions carefully (unless otherwise directed):  Hold all erectile dysfunction medications at least 3 days (72 hrs) prior to test.  On the Night  Before the Test: Be sure to Drink plenty of water. Do not consume any caffeinated/decaffeinated beverages or chocolate 12 hours prior to your test. Do not take any antihistamines 12 hours prior to your test.  On the Day of the Test: Drink plenty of water until 1 hour prior to the test. Do not eat any food 4 hours prior to the test. You may take your regular medications prior to the test.  Take metoprolol (Lopressor) two hours prior to test. HOLD Furosemide/Hydrochlorothiazide morning of the test. FEMALES- please wear underwire-free bra if available, avoid dresses & tight clothing      After the Test: Drink plenty of water. After receiving IV contrast, you may experience a mild flushed feeling. This is normal. On occasion, you may experience a mild rash up to 24 hours after the test. This is not dangerous. If this occurs, you can take Benadryl 25 mg and increase your fluid intake. If you experience trouble breathing, this can be serious. If it is severe call 911 IMMEDIATELY. If it is mild, please call our office. If you take any of these medications: Glipizide/Metformin, Avandament, Glucavance, please do not take 48 hours after completing test unless otherwise instructed.  We will call to schedule your test 2-4 weeks out understanding that some insurance companies will need an authorization prior to the service being performed.   For non-scheduling related questions, please contact the cardiac imaging nurse navigator should you have any questions/concerns: Marchia Bond, Cardiac Imaging Nurse Navigator Gordy Clement, Cardiac Imaging Nurse Navigator [facility] Direct Office Dial: [phone]   For scheduling needs, including cancellations and rescheduling, please call [place], [phone].  Follow-Up: At [facility], you and your health needs are our priority.  As part of our continuing mission to provide you with exceptional heart care, we have created  designated Provider Care Teams.  These Care Teams include your primary Cardiologist (physician) and Advanced Practice Providers (APPs -  Physician Assistants and Nurse Practitioners) who all work together to provide you with the care you need, when you need it.  We recommend signing up for the patient portal called "MyChart".  Sign up information is provided on this After Visit Summary.  MyChart is used to connect with patients for Virtual Visits (Telemedicine).  Patients are able to view lab/test results, encounter notes, upcoming appointments, etc.  Non-urgent messages can be sent to your provider as well.   To learn more about what you can do with MyChart, go to [email or web address].    Your next appointment:   6 month(s)  The format for your next appointment:   In Person  Provider:   Robbie Lis, PA-C, Nicholes Rough, PA-C, Christen Bame, NP, or Richardson Dopp, PA-C     Then, Candee Furbish, MD will plan to see you again in 1 year(s).{   Important Information About Sugar         I,Mathew Stumpf,acting as a scribe for Candee Furbish, MD.,have documented all relevant documentation on the behalf of Candee Furbish, MD,as directed by  Candee Furbish, MD while in the presence of Candee Furbish, MD.  I, Candee Furbish, MD, have reviewed all documentation for this visit. The documentation on 02/08/22 for the exam, diagnosis, procedures, and orders  are all accurate and complete.   Signed, Candee Furbish, MD  02/08/2022 9:06 AM    [facility]

## 2022-02-19 DIAGNOSIS — H35383 Toxic maculopathy, bilateral: Secondary | ICD-10-CM | POA: Diagnosis not present

## 2022-02-19 DIAGNOSIS — R200 Anesthesia of skin: Secondary | ICD-10-CM | POA: Diagnosis not present

## 2022-02-19 DIAGNOSIS — H16223 Keratoconjunctivitis sicca, not specified as Sjogren's, bilateral: Secondary | ICD-10-CM | POA: Diagnosis not present

## 2022-02-19 DIAGNOSIS — H40013 Open angle with borderline findings, low risk, bilateral: Secondary | ICD-10-CM | POA: Diagnosis not present

## 2022-02-21 ENCOUNTER — Encounter: Payer: Self-pay | Admitting: Physician Assistant

## 2022-02-21 DIAGNOSIS — M85832 Other specified disorders of bone density and structure, left forearm: Secondary | ICD-10-CM | POA: Diagnosis not present

## 2022-02-21 DIAGNOSIS — Z780 Asymptomatic menopausal state: Secondary | ICD-10-CM | POA: Diagnosis not present

## 2022-02-22 ENCOUNTER — Ambulatory Visit (HOSPITAL_COMMUNITY): Payer: PPO | Attending: Cardiology

## 2022-02-22 DIAGNOSIS — R072 Precordial pain: Secondary | ICD-10-CM | POA: Diagnosis not present

## 2022-02-22 DIAGNOSIS — R0602 Shortness of breath: Secondary | ICD-10-CM | POA: Diagnosis not present

## 2022-02-25 ENCOUNTER — Telehealth: Payer: Self-pay | Admitting: *Deleted

## 2022-02-25 NOTE — Telephone Encounter (Signed)
Received DEXA results from [facility].  Date of Scan: 02/21/2022  Lowest T-score:-1.8  BMD:0.586  Lowest site measured:Left 1/3 distal radius  DX: Osteopenia  Significant changes in BMD and site measured (5% and above):-6 % Left 1/3 distal radius  Current Regimen:Reclast 06/18/2021, Previously on Fosamax and completed 2 years of Forteo  Recommendation:Calcium, Vitamin D and Exercises. Osteopenia, decreased in BMD, Will discuss at follow up visit.   Reviewed by: Dr. Bo Merino   Next Appointment:  07/18/2022

## 2022-02-27 ENCOUNTER — Telehealth (HOSPITAL_COMMUNITY): Payer: Self-pay | Admitting: *Deleted

## 2022-02-27 NOTE — Telephone Encounter (Signed)
Reaching out to patient to offer assistance regarding upcoming cardiac imaging study; pt verbalizes understanding of appt date/time, parking situation and where to check in, pre-test NPO status, and verified current allergies; name and call back number provided for further questions should they arise  Gordy Clement RN Navigator Cardiac [facility] [phone] office [phone] cell  Patient aware to arrive at noon.

## 2022-02-28 ENCOUNTER — Encounter (HOSPITAL_COMMUNITY): Payer: Self-pay

## 2022-02-28 ENCOUNTER — Other Ambulatory Visit: Payer: Self-pay | Admitting: Internal Medicine

## 2022-02-28 ENCOUNTER — Ambulatory Visit (HOSPITAL_COMMUNITY)
Admission: RE | Admit: 2022-02-28 | Discharge: 2022-02-28 | Disposition: A | Discharge status: Home / Self Care | Payer: PPO | Source: Ambulatory Visit | Attending: Cardiology | Admitting: Cardiology

## 2022-02-28 ENCOUNTER — Ambulatory Visit (HOSPITAL_BASED_OUTPATIENT_CLINIC_OR_DEPARTMENT_OTHER)
Admission: RE | Admit: 2022-02-28 | Discharge: 2022-02-28 | Disposition: A | Discharge status: Home / Self Care | Payer: PPO | Source: Ambulatory Visit | Attending: Internal Medicine | Admitting: Internal Medicine

## 2022-02-28 DIAGNOSIS — R931 Abnormal findings on diagnostic imaging of heart and coronary circulation: Secondary | ICD-10-CM

## 2022-02-28 DIAGNOSIS — I2510 Atherosclerotic heart disease of native coronary artery without angina pectoris: Secondary | ICD-10-CM | POA: Diagnosis not present

## 2022-02-28 DIAGNOSIS — R072 Precordial pain: Secondary | ICD-10-CM | POA: Diagnosis not present

## 2022-02-28 DIAGNOSIS — R0602 Shortness of breath: Secondary | ICD-10-CM | POA: Diagnosis not present

## 2022-02-28 MED ORDER — NITROGLYCERIN 0.4 MG SL SUBL
0.8000 mg | SUBLINGUAL_TABLET | Freq: Once | SUBLINGUAL | Status: AC
  Administered 2022-02-28: 0.8 mg via SUBLINGUAL

## 2022-02-28 MED ORDER — NITROGLYCERIN 0.4 MG SL SUBL
SUBLINGUAL_TABLET | SUBLINGUAL | Status: AC
  Filled 2022-02-28: qty 2

## 2022-02-28 MED ORDER — IOHEXOL 350 MG/ML SOLN
100.0000 mL | Freq: Once | INTRAVENOUS | Status: AC | PRN
  Administered 2022-02-28: 100 mL via INTRAVENOUS

## 2022-03-01 ENCOUNTER — Encounter: Payer: Self-pay | Admitting: *Deleted

## 2022-03-01 ENCOUNTER — Encounter: Payer: Self-pay | Admitting: Cardiology

## 2022-03-01 ENCOUNTER — Ambulatory Visit: Payer: PPO | Attending: Cardiology | Admitting: Cardiology

## 2022-03-01 ENCOUNTER — Ambulatory Visit (HOSPITAL_COMMUNITY)
Admission: RE | Admit: 2022-03-01 | Discharge: 2022-03-01 | Disposition: A | Discharge status: Home / Self Care | Payer: PPO | Source: Ambulatory Visit | Attending: Internal Medicine | Admitting: Internal Medicine

## 2022-03-01 VITALS — BP 100/70 | HR 60 | Ht 63.0 in | Wt 200.0 lb

## 2022-03-01 DIAGNOSIS — E7800 Pure hypercholesterolemia, unspecified: Secondary | ICD-10-CM | POA: Diagnosis not present

## 2022-03-01 DIAGNOSIS — M47816 Spondylosis without myelopathy or radiculopathy, lumbar region: Secondary | ICD-10-CM | POA: Diagnosis not present

## 2022-03-01 DIAGNOSIS — I25119 Atherosclerotic heart disease of native coronary artery with unspecified angina pectoris: Secondary | ICD-10-CM | POA: Diagnosis not present

## 2022-03-01 DIAGNOSIS — R931 Abnormal findings on diagnostic imaging of heart and coronary circulation: Secondary | ICD-10-CM

## 2022-03-01 MED ORDER — ASPIRIN 81 MG PO TBEC: 81.0000 mg | DELAYED_RELEASE_TABLET | Freq: Every day | ORAL | 3 refills | Status: AC

## 2022-03-01 MED ORDER — EZETIMIBE 10 MG PO TABS: 10.0000 mg | ORAL_TABLET | Freq: Every day | ORAL | 3 refills | Status: DC

## 2022-03-01 MED ORDER — ISOSORBIDE MONONITRATE ER 30 MG PO TB24: 30.0000 mg | ORAL_TABLET | Freq: Every day | ORAL | 3 refills | Status: DC

## 2022-03-01 NOTE — Patient Instructions (Signed)
Medication Instructions:  Please start Aspirin 81 mg a day, Isosorbide 30 mg a day and zetia 10 mg a day. Continue all other medications as listed.  *If you need a refill on your cardiac medications before your next appointment, please call your pharmacy*   Lab Work: Please have Lipid panel in 3 months.  If you have labs (blood work) drawn today and your tests are completely normal, you will receive your results only by: [place] (if you have MyChart) OR A paper copy in the mail If you have any lab test that is abnormal or we need to change your treatment, we will call you to review the results.  Follow-Up: At [facility], you and your health needs are our priority.  As part of our continuing mission to provide you with exceptional heart care, we have created designated Provider Care Teams.  These Care Teams include your primary Cardiologist (physician) and Advanced Practice Providers (APPs -  Physician Assistants and Nurse Practitioners) who all work together to provide you with the care you need, when you need it.  We recommend signing up for the patient portal called "MyChart".  Sign up information is provided on this After Visit Summary.  MyChart is used to connect with patients for Virtual Visits (Telemedicine).  Patients are able to view lab/test results, encounter notes, upcoming appointments, etc.  Non-urgent messages can be sent to your provider as well.   To learn more about what you can do with MyChart, go to [email or web address].    Your next appointment:   3 month(s)  The format for your next appointment:   In Person  Provider:   Robbie Lis, PA-C, Nicholes Rough, PA-C, Melina Copa, PA-C, Ambrose Pancoast, NP, Ermalinda Barrios, PA-C, Christen Bame, NP, or Richardson Dopp, PA-C           Important Information About Sugar

## 2022-03-01 NOTE — Progress Notes (Signed)
Cardiology Office Note:    Date:  03/01/2022   ID:  Kari [name], DOB 1951-01-16, MRN [id number]  PCP:  Kathyrn Lass   [facility] Providers Cardiologist:  Candee Furbish, MD    Referring MD: No ref. provider found    History of Present Illness:    Kari [name] is a 71 y.o. female here for follow up and to discuss her coronary CT results.  She had a coronary CT 02/28/2022 that showed moderate CAD in mid LAD, distal LAD, mid RCA, 50-69% stenosis, CADRADS 3. Total plaque volume 643 mm3. Probable severe ostial and proximal stenosis of small caliber ramus intermedius, 70-99% stenosis, CADRADS 4. Vessel is <2 mm. Coronary calcium score is 867, which places the patient in the 95th percentile for age and sex matched control. Possible patent foramen ovale without evidence of shunting on this exam.  Seen in urgent care 01/21/2022 with concerns for viral illness. At home COVID-19 test was negative.  She called the office 12/25/2021 reporting chest pain over the prior month, as well as dizziness with bending over and SOB with activity. Reported MD wonders if RA has settled into chest wall and this could be causing CP.  She had an OV with PCP that same day at 11 am to address complaint. BP 126/55-61 on 12/24/21.  Reported medications were the same except PCP ordered amlodipine 5 mg PO QD on 10/08/21. She was advised to go to her scheduled OV and she was scheduled to see DOD on 12/27/21.  Previously seen by hypertension clinic.  Excellent.  Has a family history of stroke and myocardial infarction.  Echocardiogram previously reviewed shows normal ejection fraction. No evidence of mitral valve prolapse.  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  Her son enjoys anime.  She would like to go on a bullet train in [place].  She is also hopeful for a cruise around [place] and [place].  She was planning to go down to the [place].  At a previous visit she reported improvement of her  palpitations after starting spironolactone.  At her last appointment, she reported becoming fatigued easily. Her chest began hurting, and it was difficult to catch her breath. She described that "her heart felt like it was coming out of her chest." These symptoms had been gradually worsening. She had a lot of difficulty with walking on cobbled paths and through large castles while on her trip to [place]. Her symptoms were not as severe with slow and steady walking. She was scheduled to receive more injections in her back. Her back pain continued to significantly limit her formal exercise. Repeat echo and coronary CT were ordered.  Today: She is accompanied by a family member. Earlier today she received cortisone injections in her back.  We reviewed her coronary CT results in detail.  She is compliant with rosuvastatin 20 mg. Her LDL was 75 as of 03/2021.  For increasing her exercise she would like to find a swimming pool.   She denies any palpitations, chest pain, shortness of breath, or peripheral edema. No lightheadedness, headaches, syncope, orthopnea, or PND.   Past Medical History:  Diagnosis Date   Arthritis    Complication of anesthesia    when block attempted ax -turned red in preop-surg delayed   Fibromyalgia    GERD (gastroesophageal reflux disease)    Hypertension    Hypothyroidism    MVP (mitral valve prolapse)    Osteoporosis    Rheumatoid aortitis    ?  arthritis    Past Surgical History:  Procedure Laterality Date   ABDOMINAL HYSTERECTOMY     CESAREAN SECTION     CHOLECYSTECTOMY     COLONOSCOPY     JOINT REPLACEMENT     KNEE ARTHROSCOPY Right 2/11   TOTAL HIP ARTHROPLASTY Left 02/03/2013   Procedure: TOTAL HIP ARTHROPLASTY;  Surgeon: Kerin Salen, MD;  Location: [facility];  Service: Orthopedics;  Laterality: Left;   TOTAL HIP ARTHROPLASTY Right 09/22/2020   Dr. Nicholes Calamity NERVE TRANSPOSITION  09/17/2011   Procedure: ULNAR NERVE DECOMPRESSION/TRANSPOSITION;   Surgeon: Cammie Sickle., MD;  Location: [facility];  Service: Orthopedics;  Laterality: Left;  decompression   WRIST ARTHROSCOPY Right    x2-fusion     WRIST ARTHROSCOPY Left     Current Medications: Current Meds  Medication Sig   amLODipine (NORVASC) 5 MG tablet Take 5 mg by mouth daily.   aspirin EC 81 MG tablet Take 1 tablet (81 mg total) by mouth daily. Swallow whole.   benzonatate (TESSALON) 100 MG capsule Take 1 capsule (100 mg total) by mouth every 8 (eight) hours as needed for cough.   Calcium-Vitamin D (CALTRATE 600 PLUS-VIT D PO) Take 1 tablet by mouth 2 (two) times daily.   cholecalciferol (VITAMIN D) 1000 UNITS tablet Take 1,000 Units by mouth daily.   cyclobenzaprine (FLEXERIL) 10 MG tablet Take 10 mg by mouth as needed.    diclofenac Sodium (VOLTAREN) 1 % GEL Apply topically.   eletriptan (RELPAX) 20 MG tablet as needed.    ENBREL SURECLICK 50 MG/ML injection Inject 50 mg into the skin once a week.   ezetimibe (ZETIA) 10 MG tablet Take 1 tablet (10 mg total) by mouth daily.   gabapentin (NEURONTIN) 300 MG capsule TAKE FOUR CAPSULES BY MOUTH DAILY   isosorbide mononitrate (IMDUR) 30 MG 24 hr tablet Take 1 tablet (30 mg total) by mouth daily.   leflunomide (ARAVA) 10 MG tablet Take 1 tablet (10 mg total) by mouth daily.   levothyroxine (SYNTHROID) 75 MCG tablet Take 75 mcg by mouth every morning.   losartan (COZAAR) 100 MG tablet TAKE ONE TABLET BY MOUTH ONCE DAILY   Multiple Vitamin (MULTIVITAMIN WITH MINERALS) TABS Take 1 tablet by mouth daily.   nadolol (CORGARD) 40 MG tablet Take 1 tablet (40 mg total) by mouth every morning.   Omega-3 Fatty Acids (FISH OIL PO) Take 1 capsule by mouth daily.   ondansetron (ZOFRAN) 4 MG tablet TAKE ONE TABLET BY MOUTH EVERY 8 HOURS AS NEEDED FOR NAUSEA AND VOMITING   OVER THE COUNTER MEDICATION at bedtime. CBD   pantoprazole (PROTONIX) 40 MG tablet Take 40 mg by mouth 2 (two) times daily.    PROAIR HFA 108 (90 BASE)  MCG/ACT inhaler as needed.   rosuvastatin (CRESTOR) 20 MG tablet Take 1 tablet (20 mg total) by mouth daily. Please keep upcoming appt. With Dr. Marlou Porch in Aug. In order to receive future refills. Thank You.   spironolactone (ALDACTONE) 25 MG tablet Take 1 tablet (25 mg total) by mouth daily. Please keep upcoming appt. With Dr. Marlou Porch in Aug. In order to receive future refills. Thank You.   sucralfate (CARAFATE) 1 g tablet Take 1 g by mouth as needed (stomach).    TYRVAYA 0.03 MG/ACT SOLN Place 1 spray into both nostrils 2 (two) times daily.   valACYclovir (VALTREX) 1000 MG tablet as needed.     Allergies:   Humira [adalimumab], Clinoril [sulindac], Erythromycin, Hydrocodone,  Ibuprofen, Penicillins, Plaquenil [hydroxychloroquine], Sulfa antibiotics, Trimethoprim, and Vancomycin cross reactors   Social History   Socioeconomic History   Marital status: Widowed    Spouse name: Not on file   Number of children: Not on file   Years of education: Not on file   Highest education level: Not on file  Occupational History   Not on file  Tobacco Use   Smoking status: Never    Passive exposure: Never   Smokeless tobacco: Never  Vaping Use   Vaping Use: Never used  Substance and Sexual Activity   Alcohol use: Yes    Comment: rarely    Drug use: No   Sexual activity: Not on file  Other Topics Concern   Not on file  Social History Narrative   Not on file   Social Determinants of Health   Financial Resource Strain: Not on file  Food Insecurity: Not on file  Transportation Needs: Not on file  Physical Activity: Not on file  Stress: Not on file  Social Connections: Not on file     Family History: The patient's family history includes Colon cancer in an other family member; Colon polyps in an other family member; Heart attack in her father; Heart disease in her mother; Hypothyroidism in her son; Stroke in her mother.  ROS:   Please see the history of present illness.    All other  systems reviewed and are negative.  EKGs/Labs/Other Studies Reviewed:    The following studies were reviewed today:  Coronary CT  02/28/2022: FINDINGS: Image quality: Average   Noise artifact is: Reduced signal to noise   Coronary calcium score is 867, which places the patient in the 95th percentile for age and sex matched control.   Coronary arteries: Normal coronary origins.  Right dominance.   Right Coronary Artery: Mild atherosclerotic plaque at the ostial RCA, 25-49% stenosis. Moderate mixed atherosclerotic plaque in the mid RCA, 50-69% stenosis. Patent PDA, PLA.   Left Main Coronary Artery: Minimal ostial mixed atherosclerotic plaque, <25% stenosis. Mild mixed atherosclerotic plaque in the distal LMCA extending into the ostial LAD and LCx, 25-49% stenosis.   Left Anterior Descending Coronary Artery: Mild mixed atherosclerotic plaque in the ostial and proximal LAD, 25-49% stenosis. Moderate mixed atherosclerotic plaque in the mid and distal LAD, 50-69% stenosis. Ramus intermedius covers the territory of the first diagonal. A functionally second diagonal is patent with scattered nonobstructive plaque. Third diagonal is small and patent.   Ramus intermedius: Small caliber vessel (<2 mm) with probable severe ostial and proximal mixed atherosclerotic plaque, 70-99% stenosis.   Left Circumflex Artery: Mild mixed atherosclerotic plaque in the ostial LCx, 25-49% stenosis. Minimal atherosclerotic plaque in OM1 and OM2, <25% stenosis.   Aorta: Normal size, 33 mm at the mid ascending aorta (level of the PA bifurcation) measured double oblique. Moderate mixed plaque. No dissection.   Aortic Valve: No calcifications.   Other findings:   Normal pulmonary vein drainage into the left atrium.   Normal left atrial appendage without thrombus.   Normal size of the pulmonary artery.   Probable patent foramen ovale.   IMPRESSION: 1. Moderate CAD in mid LAD, distal LAD, mid RCA,  50-69% stenosis, CADRADS 3. Total plaque volume 643 mm3. CT FFR will be performed and reported separately.   2. Probable severe ostial and proximal stenosis of small caliber ramus intermedius, 70-99% stenosis, CADRADS 4. Vessel is <2 mm.   3. Coronary calcium score is 867, which places the patient in the 95th  percentile for age and sex matched control.   4. Normal coronary origins with right dominance.   5. Possible patent foramen ovale without evidence of shunting on this exam.   RECOMMENDATIONS: CAD-RADS 3. Moderate stenosis. Consider symptom-guided anti-ischemic pharmacotherapy as well as risk factor modification per guideline directed care. Additional analysis with CT FFR will be submitted.  CT FFR ANALYSIS  02/28/2022: FINDINGS: FFRct analysis was performed on the original cardiac CT angiogram dataset. Diagrammatic representation of the FFRct analysis is provided in a separate PDF document in PACS. This dictation was created using the PDF document and an interactive 3D model of the results. 3D model is not available in the EMR/PACS. Normal FFR range is >0.80. Indeterminate (grey) zone is 0.76-0.80. FFR delta of 0.13 is considered significant.   1. Left Main: FFR = 0.98   2. LAD: Proximal FFR = 0.94, mid FFR = 0.89, Distal FFR = 0.66 with delta of 0.13. 3. LCX: Proximal FFR = 0.98, distal FFR = 0.94 4. RCA: Proximal FFR = 0.91, mid FFR =0.89, Distal FFR = 0.88   IMPRESSION: 1. CT FFR analysis showed high likelihood of significant stenosis in the distal LAD, FFR 0.66 with delta of 0.79-0.66 (0.13). Vessel measures 2.5 mm proximal to the stenosis.   RECOMMENDATIONS: Guideline-directed medical therapy and aggressive risk factor modification for secondary prevention of coronary artery disease. This lesion looks most amenable to medical therapy given very distal location. However if symptoms are refractory to medical therapy, consider coronary angiography given vessel  size.  Echo  02/22/2022: Final results pending.  Echocardiogram 11/08/2019:  1. Left ventricular ejection fraction, by estimation, is 60 to 65%. The  left ventricle has normal function. The left ventricle has no regional  wall motion abnormalities. There is moderate left ventricular hypertrophy  of the basal-septal segment. Left  ventricular diastolic parameters are consistent with Grade I diastolic  dysfunction (impaired relaxation). Elevated left ventricular end-diastolic  pressure.   2. Right ventricular systolic function is normal. The right ventricular  size is normal.   3. No evidence of mitral valve prolapse. The mitral valve is normal in  structure. No evidence of mitral valve regurgitation. No evidence of  mitral stenosis.   4. The aortic valve is tricuspid. Aortic valve regurgitation is not  visualized. Mild aortic valve sclerosis is present, with no evidence of  aortic valve stenosis.   5. The inferior vena cava is normal in size with greater than 50%  respiratory variability, suggesting right atrial pressure of 3 mmHg.    EKG:  EKG is personally reviewed. 03/01/2022:  EKG was not ordered. 02/08/2022:  Sinus bradycardia. Rate 57 bpm. 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 LAFB. Nonspecific ST changes. 01/12/2021: sinus rhythm 60 left anterior fascicular block poor R wave progression  Recent Labs: 01/02/2022: ALT 25; Hemoglobin 11.9; Platelets 221 02/08/2022: BUN 20; Creatinine, Ser 1.20; Potassium 4.2; Sodium 140   Recent Lipid Panel No results found for: "CHOL", "TRIG", "HDL", "CHOLHDL", "VLDL", "LDLCALC", "LDLDIRECT"   Risk Assessment/Calculations:          Physical Exam:    VS:  BP 100/70 (BP Location: Left Arm, Patient Position: Sitting, Cuff Size: Normal)   Pulse 60   Ht '5\' 3"'$  (1.6 m)   Wt 200 lb (90.7 kg)   SpO2 96%   BMI 35.43 kg/m     Wt Readings from Last 3 Encounters:  03/01/22 200 lb (90.7 kg)  02/08/22 197 lb 3.2 oz (89.4 kg)  01/28/22 199 lb (90.3 kg)  GEN:  Well nourished, well developed in no acute distress HEENT: Normal NECK: No JVD; No carotid bruits LYMPHATICS: No lymphadenopathy CARDIAC: RRR, no murmurs, rubs, gallops RESPIRATORY:  Clear to auscultation without rales, wheezing or rhonchi  ABDOMEN: Soft, non-tender, non-distended MUSCULOSKELETAL:  No edema; No deformity  SKIN: Warm and dry NEUROLOGIC:  Alert and oriented x 3 PSYCHIATRIC:  Normal affect   ASSESSMENT:    1. Pure hypercholesterolemia   2. Coronary artery disease involving native coronary artery of native heart with angina pectoris (HCC)      PLAN:    In order of problems listed above:  Coronary artery disease - Coronary CT scan results reviewed as noted above.  FFR was abnormal in the distal LAD distribution.  We will go ahead and treat with goal-directed medical therapy - Add Zetia 10 mg to her Crestor 20 mg a day.  We will check lipid panel in 3 months.  Goal LDL is less than 70. -I will add enteric-coated baby aspirin 81 mg.  She is on Protonix. -I will also add isosorbide 30 mg a day.  Watch for headache.  Shortness of breath -Echocardiogram reassuring.  Read from [place] did not transmit to epic.  Reviewed on workstation.  Normal EF.  Deconditioning certainly could be playing a role.  She notes her symptoms quite dramatically when traveling to [place].  Difficult for her to walk the distances.  She does have ongoing back pain, rheumatoid arthritis which limits her ability to exercise well.  She gets injections periodically.  Palpitations - Has had what seemed like PVCs by history.   She is also on Corgard or nadolol 40 mg every morning, beta-blocker.   No change in her medical management.  Refill beta-blocker as needed.  Rheumatoid arthritis - On leflunomide.  Rheumatoid modulator.  Excellent.  Per primary team.  Monitored by rheumatology.  Right hip replaced March 2022  Essential hypertension - Overall well controlled with losartan 100 mg a day  nadolol 40 mg a day, spironolactone 25 mg a day.  Refills as needed for this medical management.  No changes made today.  Prior echocardiogram showed no evidence of mitral valve prolapse.  Very soft murmur noted on exam.  Hyperlipidemia - Continue with Crestor 20 mg a day LDL 75.  Started Zetia 10.  No myalgias.  Follow-up:  3 months with APP.  Medication Adjustments/Labs and Tests Ordered: Current medicines are reviewed at length with the patient today.  Concerns regarding medicines are outlined above.   Orders Placed This Encounter  Procedures   Lipid panel   Meds ordered this encounter  Medications   isosorbide mononitrate (IMDUR) 30 MG 24 hr tablet    Sig: Take 1 tablet (30 mg total) by mouth daily.    Dispense:  90 tablet    Refill:  3   ezetimibe (ZETIA) 10 MG tablet    Sig: Take 1 tablet (10 mg total) by mouth daily.    Dispense:  90 tablet    Refill:  3   aspirin EC 81 MG tablet    Sig: Take 1 tablet (81 mg total) by mouth daily. Swallow whole.    Dispense:  90 tablet    Refill:  3   Patient Instructions  Medication Instructions:  Please start Aspirin 81 mg a day, Isosorbide 30 mg a day and zetia 10 mg a day. Continue all other medications as listed.  *If you need a refill on your cardiac medications before your next appointment, please call  your pharmacy*   Lab Work: Please have Lipid panel in 3 months.  If you have labs (blood work) drawn today and your tests are completely normal, you will receive your results only by: [place] (if you have MyChart) OR A paper copy in the mail If you have any lab test that is abnormal or we need to change your treatment, we will call you to review the results.  Follow-Up: At [facility], you and your health needs are our priority.  As part of our continuing mission to provide you with exceptional heart care, we have created designated Provider Care Teams.  These Care Teams include your primary Cardiologist  (physician) and Advanced Practice Providers (APPs -  Physician Assistants and Nurse Practitioners) who all work together to provide you with the care you need, when you need it.  We recommend signing up for the patient portal called "MyChart".  Sign up information is provided on this After Visit Summary.  MyChart is used to connect with patients for Virtual Visits (Telemedicine).  Patients are able to view lab/test results, encounter notes, upcoming appointments, etc.  Non-urgent messages can be sent to your provider as well.   To learn more about what you can do with MyChart, go to [email or web address].    Your next appointment:   3 month(s)  The format for your next appointment:   In Person  Provider:   Robbie Lis, PA-C, Nicholes Rough, PA-C, Melina Copa, PA-C, Ambrose Pancoast, NP, Ermalinda Barrios, PA-C, Christen Bame, NP, or Richardson Dopp, PA-C           Important Information About Sugar         I,Mathew Stumpf,acting as a scribe for Candee Furbish, MD.,have documented all relevant documentation on the behalf of Candee Furbish, MD,as directed by  Candee Furbish, MD while in the presence of Candee Furbish, MD.  I, Candee Furbish, MD, have reviewed all documentation for this visit. The documentation on 03/01/22 for the exam, diagnosis, procedures, and orders are all accurate and complete.   Signed, Candee Furbish, MD  03/01/2022 5:21 PM    [place]

## 2022-03-02 LAB — ECHOCARDIOGRAM COMPLETE
Area-P 1/2: 3.42 cm2
Calc EF: 68.2 %
S' Lateral: 2 cm
Single Plane A2C EF: 68.4 %
Single Plane A4C EF: 67.5 %

## 2022-03-06 DIAGNOSIS — R200 Anesthesia of skin: Secondary | ICD-10-CM | POA: Diagnosis not present

## 2022-03-12 DIAGNOSIS — R200 Anesthesia of skin: Secondary | ICD-10-CM | POA: Diagnosis not present

## 2022-03-13 ENCOUNTER — Encounter: Payer: Self-pay | Admitting: Neurology

## 2022-03-23 ENCOUNTER — Other Ambulatory Visit: Payer: Self-pay | Admitting: Cardiology

## 2022-03-25 ENCOUNTER — Encounter: Payer: Self-pay | Admitting: Physician Assistant

## 2022-03-25 ENCOUNTER — Ambulatory Visit: Payer: PPO | Attending: Physician Assistant | Admitting: Physician Assistant

## 2022-03-25 ENCOUNTER — Other Ambulatory Visit: Payer: Self-pay

## 2022-03-25 VITALS — BP 108/60 | HR 58 | Ht 63.0 in | Wt 203.2 lb

## 2022-03-25 DIAGNOSIS — I10 Essential (primary) hypertension: Secondary | ICD-10-CM | POA: Diagnosis not present

## 2022-03-25 DIAGNOSIS — R42 Dizziness and giddiness: Secondary | ICD-10-CM | POA: Diagnosis not present

## 2022-03-25 DIAGNOSIS — E7800 Pure hypercholesterolemia, unspecified: Secondary | ICD-10-CM

## 2022-03-25 DIAGNOSIS — I25119 Atherosclerotic heart disease of native coronary artery with unspecified angina pectoris: Secondary | ICD-10-CM | POA: Diagnosis not present

## 2022-03-25 LAB — CBC WITH DIFFERENTIAL/PLATELET
Basophils Absolute: 0.1 10*3/uL (ref 0.0–0.2)
Basos: 1 %
EOS (ABSOLUTE): 0.5 10*3/uL — ABNORMAL HIGH (ref 0.0–0.4)
Eos: 8 %
Hematocrit: 32.1 % — ABNORMAL LOW (ref 34.0–46.6)
Hemoglobin: 10.8 g/dL — ABNORMAL LOW (ref 11.1–15.9)
Lymphocytes Absolute: 1.5 10*3/uL (ref 0.7–3.1)
Lymphs: 24 %
MCH: 32.6 pg (ref 26.6–33.0)
MCHC: 33.6 g/dL (ref 31.5–35.7)
MCV: 97 fL (ref 79–97)
Monocytes Absolute: 0.8 10*3/uL (ref 0.1–0.9)
Monocytes: 12 %
Neutrophils Absolute: 3.4 10*3/uL (ref 1.4–7.0)
Neutrophils: 55 %
Platelets: 210 10*3/uL (ref 150–450)
RBC: 3.31 x10E6/uL — ABNORMAL LOW (ref 3.77–5.28)
RDW: 13 % (ref 11.7–15.4)
WBC: 6.3 10*3/uL (ref 3.4–10.8)

## 2022-03-25 LAB — BASIC METABOLIC PANEL
BUN/Creatinine Ratio: 15 (ref 12–28)
BUN: 17 mg/dL (ref 8–27)
CO2: 28 mmol/L (ref 20–29)
Calcium: 9.1 mg/dL (ref 8.7–10.3)
Chloride: 105 mmol/L (ref 96–106)
Creatinine, Ser: 1.17 mg/dL — ABNORMAL HIGH (ref 0.57–1.00)
Glucose: 105 mg/dL — ABNORMAL HIGH (ref 70–99)
Potassium: 3.9 mmol/L (ref 3.5–5.2)
Sodium: 141 mmol/L (ref 134–144)
eGFR: 49 mL/min/{1.73_m2} — ABNORMAL LOW (ref >59)

## 2022-03-25 MED ORDER — AMLODIPINE BESYLATE 2.5 MG PO TABS: 2.5000 mg | ORAL_TABLET | Freq: Every day | ORAL | 3 refills | Status: DC

## 2022-03-25 MED ORDER — LOSARTAN POTASSIUM 50 MG PO TABS: 50.0000 mg | ORAL_TABLET | Freq: Every day | ORAL | 3 refills | Status: DC

## 2022-03-25 MED ORDER — NADOLOL 40 MG PO TABS: 40.0000 mg | ORAL_TABLET | Freq: Every morning | ORAL | 3 refills | Status: DC

## 2022-03-25 NOTE — H&P (View-Only) (Signed)
Cardiology Office Note:    Date:  03/25/2022   ID:  Kari [name], DOB 24-Mar-1951, MRN [id number]  PCP:  Kathyrn Lass  [facility] Cardiologist:  Candee Furbish, MD  [place] Electrophysiologist:  None   Chief Complaint: Hypotension  History of Present Illness:    Kari [name] is a 71 y.o. female with a hx of CAD, rheumatoid arthritis, hypertension, GERD and fibromyalgia seen for hypotension.  Recently underwent coronary CTA August 2023 showing moderate CAD in mid LAD, distal LAD, mid RCA, 50-69% stenosis. Probable severe ostial and proximal stenosis of small caliber ramus intermedius, 70-99% stenosis. CT FFR analysis showed high likelihood of significant stenosis inthe distal LAD, FFR 0.66 with delta of 0.79-0.66 (0.13). Vessel measures 2.5 mm proximal to the stenosis. >>> recommended medical therapy.  Echo with LVEF of 60-65% and grade 1 DD.   Patient had an episode of dizziness and not feeling well while at [facility] few weeks ago.  It was hot outside.  She was evaluated at [facility] at [facility].  Blood pressure was low in 70s.  Felt better after fluids.  Declined ER evaluation at that time.  Since then she is not feeling well.  She reported 5 out of 10 chest discomfort all the time.  However with activity it gets worse to 8 out of 10.  Associated with shortness of breath.  Also reporting dizziness when bending and standing.  Reports compliance with medication.  Drinking plenty of water.    Past Medical History:  Diagnosis Date   Arthritis    Complication of anesthesia    when block attempted ax -turned red in preop-surg delayed   Fibromyalgia    GERD (gastroesophageal reflux disease)    Hypertension    Hypothyroidism    MVP (mitral valve prolapse)    Osteoporosis    Rheumatoid aortitis    ? arthritis    Past Surgical History:  Procedure Laterality Date   ABDOMINAL HYSTERECTOMY     CESAREAN SECTION     CHOLECYSTECTOMY     COLONOSCOPY     JOINT REPLACEMENT     KNEE  ARTHROSCOPY Right 2/11   TOTAL HIP ARTHROPLASTY Left 02/03/2013   Procedure: TOTAL HIP ARTHROPLASTY;  Surgeon: Kerin Salen, MD;  Location: [place];  Service: Orthopedics;  Laterality: Left;   TOTAL HIP ARTHROPLASTY Right 09/22/2020   Dr. Nicholes Calamity NERVE TRANSPOSITION  09/17/2011   Procedure: ULNAR NERVE DECOMPRESSION/TRANSPOSITION;  Surgeon: Cammie Sickle., MD;  Location: [place];  Service: Orthopedics;  Laterality: Left;  decompression   WRIST ARTHROSCOPY Right    x2-fusion     WRIST ARTHROSCOPY Left     Current Medications: Current Meds  Medication Sig   aspirin EC 81 MG tablet Take 1 tablet (81 mg total) by mouth daily. Swallow whole.   Calcium-Vitamin D (CALTRATE 600 PLUS-VIT D PO) Take 1 tablet by mouth 2 (two) times daily.   cholecalciferol (VITAMIN D) 1000 UNITS tablet Take 1,000 Units by mouth daily.   cyclobenzaprine (FLEXERIL) 10 MG tablet Take 10 mg by mouth as needed.    diclofenac Sodium (VOLTAREN) 1 % GEL Apply topically.   eletriptan (RELPAX) 20 MG tablet as needed.    ENBREL SURECLICK 50 MG/ML injection Inject 50 mg into the skin once a week.   ezetimibe (ZETIA) 10 MG tablet Take 1 tablet (10 mg total) by mouth daily.   gabapentin (NEURONTIN) 300 MG capsule TAKE FOUR CAPSULES BY MOUTH DAILY  isosorbide mononitrate (IMDUR) 30 MG 24 hr tablet Take 1 tablet (30 mg total) by mouth daily.   leflunomide (ARAVA) 10 MG tablet Take 1 tablet (10 mg total) by mouth daily.   levothyroxine (SYNTHROID) 75 MCG tablet Take 75 mcg by mouth every morning.   Multiple Vitamin (MULTIVITAMIN WITH MINERALS) TABS Take 1 tablet by mouth daily.   nadolol (CORGARD) 40 MG tablet Take 1 tablet (40 mg total) by mouth every morning.   Omega-3 Fatty Acids (FISH OIL PO) Take 1 capsule by mouth daily.   ondansetron (ZOFRAN) 4 MG tablet TAKE ONE TABLET BY MOUTH EVERY 8 HOURS AS NEEDED FOR NAUSEA AND VOMITING   OVER THE COUNTER MEDICATION at bedtime. CBD   pantoprazole (PROTONIX)  40 MG tablet Take 40 mg by mouth 2 (two) times daily.    PROAIR HFA 108 (90 BASE) MCG/ACT inhaler as needed.   rosuvastatin (CRESTOR) 20 MG tablet Take 1 tablet (20 mg total) by mouth daily. Please keep upcoming appt. With Dr. Marlou Porch in Aug. In order to receive future refills. Thank You.   spironolactone (ALDACTONE) 25 MG tablet Take 1 tablet (25 mg total) by mouth daily. Please keep upcoming appt. With Dr. Marlou Porch in Aug. In order to receive future refills. Thank You.   sucralfate (CARAFATE) 1 g tablet Take 1 g by mouth as needed (stomach).    TYRVAYA 0.03 MG/ACT SOLN Place 1 spray into both nostrils 2 (two) times daily.   valACYclovir (VALTREX) 1000 MG tablet as needed.   [DISCONTINUED] amLODipine (NORVASC) 5 MG tablet Take 5 mg by mouth daily.   [DISCONTINUED] losartan (COZAAR) 100 MG tablet TAKE ONE TABLET BY MOUTH ONCE DAILY     Allergies:   Humira [adalimumab], Clinoril [sulindac], Erythromycin, Hydrocodone, Ibuprofen, Penicillins, Plaquenil [hydroxychloroquine], Sulfa antibiotics, Trimethoprim, and Vancomycin cross reactors   Social History   Socioeconomic History   Marital status: Widowed    Spouse name: Not on file   Number of children: Not on file   Years of education: Not on file   Highest education level: Not on file  Occupational History   Not on file  Tobacco Use   Smoking status: Never    Passive exposure: Never   Smokeless tobacco: Never  Vaping Use   Vaping Use: Never used  Substance and Sexual Activity   Alcohol use: Yes    Comment: rarely    Drug use: No   Sexual activity: Not on file  Other Topics Concern   Not on file  Social History Narrative   Not on file   Social Determinants of Health   Financial Resource Strain: Not on file  Food Insecurity: Not on file  Transportation Needs: Not on file  Physical Activity: Not on file  Stress: Not on file  Social Connections: Not on file     Family History: The patient's family history includes Colon cancer  in an other family member; Colon polyps in an other family member; Heart attack in her father; Heart disease in her mother; Hypothyroidism in her son; Stroke in her mother.    ROS:   Please see the history of present illness.    All other systems reviewed and are negative.   EKGs/Labs/Other Studies Reviewed:    The following studies were reviewed today:  Coronary CT  02/28/2022: FINDINGS: Image quality: Average   Noise artifact is: Reduced signal to noise   Coronary calcium score is 867, which places the patient in the 95th percentile for age and sex matched  control.   Coronary arteries: Normal coronary origins.  Right dominance.   Right Coronary Artery: Mild atherosclerotic plaque at the ostial RCA, 25-49% stenosis. Moderate mixed atherosclerotic plaque in the mid RCA, 50-69% stenosis. Patent PDA, PLA.   Left Main Coronary Artery: Minimal ostial mixed atherosclerotic plaque, <25% stenosis. Mild mixed atherosclerotic plaque in the distal LMCA extending into the ostial LAD and LCx, 25-49% stenosis.   Left Anterior Descending Coronary Artery: Mild mixed atherosclerotic plaque in the ostial and proximal LAD, 25-49% stenosis. Moderate mixed atherosclerotic plaque in the mid and distal LAD, 50-69% stenosis. Ramus intermedius covers the territory of the first diagonal. A functionally second diagonal is patent with scattered nonobstructive plaque. Third diagonal is small and patent.   Ramus intermedius: Small caliber vessel (<2 mm) with probable severe ostial and proximal mixed atherosclerotic plaque, 70-99% stenosis.   Left Circumflex Artery: Mild mixed atherosclerotic plaque in the ostial LCx, 25-49% stenosis. Minimal atherosclerotic plaque in OM1 and OM2, <25% stenosis.   Aorta: Normal size, 33 mm at the mid ascending aorta (level of the PA bifurcation) measured double oblique. Moderate mixed plaque. No dissection.   Aortic Valve: No calcifications.   Other findings:    Normal pulmonary vein drainage into the left atrium.   Normal left atrial appendage without thrombus.   Normal size of the pulmonary artery.   Probable patent foramen ovale.   IMPRESSION: 1. Moderate CAD in mid LAD, distal LAD, mid RCA, 50-69% stenosis, CADRADS 3. Total plaque volume 643 mm3. CT FFR will be performed and reported separately.   2. Probable severe ostial and proximal stenosis of small caliber ramus intermedius, 70-99% stenosis, CADRADS 4. Vessel is <2 mm.   3. Coronary calcium score is 867, which places the patient in the 95th percentile for age and sex matched control.   4. Normal coronary origins with right dominance.   5. Possible patent foramen ovale without evidence of shunting on this exam.   RECOMMENDATIONS: CAD-RADS 3. Moderate stenosis. Consider symptom-guided anti-ischemic pharmacotherapy as well as risk factor modification per guideline directed care. Additional analysis with CT FFR will be submitted.   CT FFR ANALYSIS  02/28/2022: FINDINGS: FFRct analysis was performed on the original cardiac CT angiogram dataset. Diagrammatic representation of the FFRct analysis is provided in a separate PDF document in PACS. This dictation was created using the PDF document and an interactive 3D model of the results. 3D model is not available in the EMR/PACS. Normal FFR range is >0.80. Indeterminate (grey) zone is 0.76-0.80. FFR delta of 0.13 is considered significant.   1. Left Main: FFR = 0.98   2. LAD: Proximal FFR = 0.94, mid FFR = 0.89, Distal FFR = 0.66 with delta of 0.13. 3. LCX: Proximal FFR = 0.98, distal FFR = 0.94 4. RCA: Proximal FFR = 0.91, mid FFR =0.89, Distal FFR = 0.88   IMPRESSION: 1. CT FFR analysis showed high likelihood of significant stenosis in the distal LAD, FFR 0.66 with delta of 0.79-0.66 (0.13). Vessel measures 2.5 mm proximal to the stenosis.   RECOMMENDATIONS: Guideline-directed medical therapy and aggressive risk  factor modification for secondary prevention of coronary artery disease. This lesion looks most amenable to medical therapy given very distal location. However if symptoms are refractory to medical therapy, consider coronary angiography given vessel size.   Echo  02/22/2022: 1. Left ventricular ejection fraction, by estimation, is 60 to 65%. The  left ventricle has normal function. The left ventricle has no regional  wall motion abnormalities. There is  mild concentric left ventricular  hypertrophy. Left ventricular diastolic  parameters are consistent with Grade I diastolic dysfunction (impaired  relaxation).   2. Right ventricular systolic function is normal. The right ventricular  size is normal.   3. The mitral valve is normal in structure. Trivial mitral valve  regurgitation. No evidence of mitral stenosis.   4. The aortic valve is tricuspid. Aortic valve regurgitation is not  visualized. No aortic stenosis is present.   5. The inferior vena cava is normal in size with greater than 50%  respiratory variability, suggesting right atrial pressure of 3 mmHg.  EKG:  EKG is  ordered today.  The ekg ordered today demonstrates NSR  Recent Labs: 01/02/2022: ALT 25; Hemoglobin 11.9; Platelets 221 02/08/2022: BUN 20; Creatinine, Ser 1.20; Potassium 4.2; Sodium 140  Recent Lipid Panel No results found for: "CHOL", "TRIG", "HDL", "CHOLHDL", "VLDL", "LDLCALC", "LDLDIRECT"  Physical Exam:    VS:  BP 108/60 (BP Location: Left Arm)   Pulse (!) 58   Ht '5\' 3"'$  (1.6 m)   Wt 203 lb 4 oz (92.2 kg)   SpO2 98%   BMI 36.00 kg/m     Wt Readings from Last 3 Encounters:  03/25/22 203 lb 4 oz (92.2 kg)  03/01/22 200 lb (90.7 kg)  02/08/22 197 lb 3.2 oz (89.4 kg)     GEN:  Well nourished, well developed in no acute distress HEENT: Normal NECK: No JVD; No carotid bruits LYMPHATICS: No lymphadenopathy CARDIAC: RRR, no murmurs, rubs, gallops RESPIRATORY:  Clear to auscultation without rales,  wheezing or rhonchi  ABDOMEN: Soft, non-tender, non-distended MUSCULOSKELETAL:  No edema; No deformity  SKIN: Warm and dry NEUROLOGIC:  Alert and oriented x 3 PSYCHIATRIC:  Normal affect   ASSESSMENT AND PLAN:    Unstable angina with CAD Patient with abnormal CT FFR as summarized above.  She is on multiple antianginal therapy with beta-blocker, Imdur and amlodipine.  Now with persistent chest pressure since hypotensive episode.  This gets worse with activity and has shortness of breath.  Reviewed with primary cardiologist Dr. Marlou Porch.  Given symptoms plan to proceed with cardiac catheterization. Will cancel her trip next week (letter provided).  The patient understands that risks include but are not limited to stroke (1 in 1000), death (1 in 21), kidney failure [usually temporary] (1 in 500), bleeding (1 in 200), allergic reaction [possibly serious] (1 in 200), and agrees to proceed.     2.  Dizziness Not orthostatic by vital but felt dizzy upon standing.  Also feeling dizziness on bending down.  Plan to reduce losartan and amlodipine per Dr. Marlou Porch.  Continue other antihypertensive regimen.  3.  Hyperlipidemia -Continue current statin therapy -No results found for requested labs within last 365 days.  - LDL 75 on 03/2021  4. HTN - As above    Medication Adjustments/Labs and Tests Ordered: Current medicines are reviewed at length with the patient today.  Concerns regarding medicines are outlined above.  Orders Placed This Encounter  Procedures   CBC with Differential/Platelet   Basic metabolic panel   EKG 76-PPJK   EKG 12-Lead   Meds ordered this encounter  Medications   amLODipine (NORVASC) 2.5 MG tablet    Sig: Take 1 tablet (2.5 mg total) by mouth daily.    Dispense:  90 tablet    Refill:  3   losartan (COZAAR) 50 MG tablet    Sig: Take 1 tablet (50 mg total) by mouth daily.    Dispense:  90  tablet    Refill:  3    Patient Instructions  Medication Instructions:   Your physician has recommended you make the following change in your medication:  1-DECREASE Losartan 50 mg by mouth daily. 2-DECREASE Amlodipine 2.5 mg by mouth daily.  *If you need a refill on your cardiac medications before your next appointment, please call your pharmacy*  Lab Work: Your physician recommends that you have lab work today. BMET and CBC.  If you have labs (blood work) drawn today and your tests are completely normal, you will receive your results only by: [place] (if you have MyChart) OR A paper copy in the mail If you have any lab test that is abnormal or we need to change your treatment, we will call you to review the results.   Testing/Procedures: Your physician has requested that you have a cardiac catheterization. Cardiac catheterization is used to diagnose and/or treat various heart conditions. Doctors may recommend this procedure for a number of different reasons. The most common reason is to evaluate chest pain. Chest pain can be a symptom of coronary artery disease (CAD), and cardiac catheterization can show whether plaque is narrowing or blocking your heart's arteries. This procedure is also used to evaluate the valves, as well as measure the blood flow and oxygen levels in different parts of your heart. For further information please visit HugeFiesta.[email or web address]. Please follow instruction sheet, as given.  Follow-Up: At [facility], you and your health needs are our priority.  As part of our continuing mission to provide you with exceptional heart care, we have created designated Provider Care Teams.  These Care Teams include your primary Cardiologist (physician) and Advanced Practice Providers (APPs -  Physician Assistants and Nurse Practitioners) who all work together to provide you with the care you need, when you need it.  We recommend signing up for the patient portal called "MyChart".  Sign up information is provided on this After Visit  Summary.  MyChart is used to connect with patients for Virtual Visits (Telemedicine).  Patients are able to view lab/test results, encounter notes, upcoming appointments, etc.  Non-urgent messages can be sent to your provider as well.   To learn more about what you can do with MyChart, go to [email or web address].    Your next appointment:   2 week(s)  The format for your next appointment:   In Person  Provider:   Robbie Lis, PA-C, Nicholes Rough, PA-C, Melina Copa, PA-C, Ambrose Pancoast, NP, Cecilie Kicks, NP, Ermalinda Barrios, PA-C, Christen Bame, NP, or Richardson Dopp, PA-C     Then, Candee Furbish, MD will plan to see you again in 6 month(s).    Other Instructions [place] A DEPT OF [place] A DE[facility] OF [facility] [place], [place] [id number] [place] [id number] Dept: [phone] Loc: [phone]  [facility] MONDS  03/25/2022  You are scheduled for a Cardiac Catheterization on Tuesday, September 26 with Dr. [name].  1. Please arrive at the [facility] (Main Entrance A) at [facility]: [address] [place], [place] [address] at 11:30 AM (This time is two hours before your procedure to ensure your preparation). Free valet parking service is available.   Special note: Every effort is made to have your procedure done on time. Please understand that emergencies sometimes delay scheduled procedures.  2. Diet: Do not eat solid foods after midnight.  The patient may  have clear liquids until 5am upon the day of the procedure.  3. Labs: You will need to have blood drawn on Monday, September 25 at [facility] at [facility]. [address]  Open: 7:30am - 5pm    Phone: [phone]. You do not need to be fasting.  4. Medication instructions in preparation for your procedure:   Contrast Allergy: No  Stop taking, Cozaar (Losartan) Tuesday,  September 26,, Aldactone  Tuesday, September 26,  On the morning of your procedure, take your Aspirin 81 mg and any morning medicines NOT listed above.  You may use sips of water.  5. Plan for one night stay--bring personal belongings. 6. Bring a current list of your medications and current insurance cards. 7. You MUST have a responsible person to drive you home. 8. Someone MUST be with you the first 24 hours after you arrive home or your discharge will be delayed. 9. Please wear clothes that are easy to get on and off and wear slip-on shoes.  Thank you for allowing [place] to care for you!   -- [facility] Invasive Cardiovascular services   Important Information About Sugar         Jarrett Soho, PA  03/25/2022 10:29 AM    [place]

## 2022-03-25 NOTE — Patient Instructions (Addendum)
Medication Instructions:  Your physician has recommended you make the following change in your medication:  1-DECREASE Losartan 50 mg by mouth daily. 2-DECREASE Amlodipine 2.5 mg by mouth daily.  *If you need a refill on your cardiac medications before your next appointment, please call your pharmacy*  Lab Work: Your physician recommends that you have lab work today. BMET and CBC.  If you have labs (blood work) drawn today and your tests are completely normal, you will receive your results only by: [place] (if you have MyChart) OR A paper copy in the mail If you have any lab test that is abnormal or we need to change your treatment, we will call you to review the results.   Testing/Procedures: Your physician has requested that you have a cardiac catheterization. Cardiac catheterization is used to diagnose and/or treat various heart conditions. Doctors may recommend this procedure for a number of different reasons. The most common reason is to evaluate chest pain. Chest pain can be a symptom of coronary artery disease (CAD), and cardiac catheterization can show whether plaque is narrowing or blocking your heart's arteries. This procedure is also used to evaluate the valves, as well as measure the blood flow and oxygen levels in different parts of your heart. For further information please visit HugeFiesta.[email or web address]. Please follow instruction sheet, as given.  Follow-Up: At [facility], you and your health needs are our priority.  As part of our continuing mission to provide you with exceptional heart care, we have created designated Provider Care Teams.  These Care Teams include your primary Cardiologist (physician) and Advanced Practice Providers (APPs -  Physician Assistants and Nurse Practitioners) who all work together to provide you with the care you need, when you need it.  We recommend signing up for the patient portal called "MyChart".  Sign up information is provided on  this After Visit Summary.  MyChart is used to connect with patients for Virtual Visits (Telemedicine).  Patients are able to view lab/test results, encounter notes, upcoming appointments, etc.  Non-urgent messages can be sent to your provider as well.   To learn more about what you can do with MyChart, go to [email or web address].    Your next appointment:   2 week(s)  The format for your next appointment:   In Person  Provider:   Robbie Lis, PA-C, Nicholes Rough, PA-C, Melina Copa, PA-C, Ambrose Pancoast, NP, Cecilie Kicks, NP, Ermalinda Barrios, PA-C, Christen Bame, NP, or Richardson Dopp, PA-C     Then, Candee Furbish, MD will plan to see you again in 6 month(s).    Other Instructions [place] A DEPT OF [place] A DE[facility] [place], [place] [id number] [employer] [place] [address] Dept: [phone] Loc: [phone]  BONETTA MOSTEK  03/25/2022  You are scheduled for a Cardiac Catheterization on Tuesday, September 26 with Dr. [name].  1. Please arrive at the [facility] (Main Entrance A) at [facility]: [address] [place], [place] [address] at 11:30 AM (This time is two hours before your procedure to ensure your preparation). Free valet parking service is available.   Special note: Every effort is made to have your procedure done on time. Please understand that emergencies sometimes delay scheduled procedures.  2. Diet: Do not eat solid foods after midnight.  The patient may have clear liquids until 5am upon the day of the procedure.  3. Labs:  You will need to have blood drawn on Monday, September 25 at [facility] at [facility] [address]  Open: 7:30am - 5pm    Phone: [phone]. You do not need to be fasting.  4. Medication instructions in preparation for your procedure:   Contrast Allergy: No  Stop taking, Cozaar (Losartan)  Tuesday, September 26,, Aldactone  Tuesday, September 26,  On the morning of your procedure, take your Aspirin 81 mg and any morning medicines NOT listed above.  You may use sips of water.  5. Plan for one night stay--bring personal belongings. 6. Bring a current list of your medications and current insurance cards. 7. You MUST have a responsible person to drive you home. 8. Someone MUST be with you the first 24 hours after you arrive home or your discharge will be delayed. 9. Please wear clothes that are easy to get on and off and wear slip-on shoes.  Thank you for allowing [place] to care for you!   -- [place] Invasive Cardiovascular services   Important Information About Sugar

## 2022-03-25 NOTE — Progress Notes (Signed)
Cardiology Office Note:    Date:  03/25/2022   ID:  Kari [name], DOB 12/26/1950, MRN [id number]  PCP:  Kari [name]  [facility] Cardiologist:  Candee Furbish, MD  [place] Electrophysiologist:  None   Chief Complaint: Hypotension  History of Present Illness:    Kari [name] is a 71 y.o. female with a hx of CAD, rheumatoid arthritis, hypertension, GERD and fibromyalgia seen for hypotension.  Recently underwent coronary CTA August 2023 showing moderate CAD in mid LAD, distal LAD, mid RCA, 50-69% stenosis. Probable severe ostial and proximal stenosis of small caliber ramus intermedius, 70-99% stenosis. CT FFR analysis showed high likelihood of significant stenosis inthe distal LAD, FFR 0.66 with delta of 0.79-0.66 (0.13). Vessel measures 2.5 mm proximal to the stenosis. >>> recommended medical therapy.  Echo with LVEF of 60-65% and grade 1 DD.   Patient had an episode of dizziness and not feeling well while at [facility] few weeks ago.  It was hot outside.  She was evaluated at [facility] at [facility].  Blood pressure was low in 70s.  Felt better after fluids.  Declined ER evaluation at that time.  Since then she is not feeling well.  She reported 5 out of 10 chest discomfort all the time.  However with activity it gets worse to 8 out of 10.  Associated with shortness of breath.  Also reporting dizziness when bending and standing.  Reports compliance with medication.  Drinking plenty of water.    Past Medical History:  Diagnosis Date   Arthritis    Complication of anesthesia    when block attempted ax -turned red in preop-surg delayed   Fibromyalgia    GERD (gastroesophageal reflux disease)    Hypertension    Hypothyroidism    MVP (mitral valve prolapse)    Osteoporosis    Rheumatoid aortitis    ? arthritis    Past Surgical History:  Procedure Laterality Date   ABDOMINAL HYSTERECTOMY     CESAREAN SECTION     CHOLECYSTECTOMY     COLONOSCOPY     JOINT REPLACEMENT     KNEE  ARTHROSCOPY Right 2/11   TOTAL HIP ARTHROPLASTY Left 02/03/2013   Procedure: TOTAL HIP ARTHROPLASTY;  Surgeon: Kerin Salen, MD;  Location: [place];  Service: Orthopedics;  Laterality: Left;   TOTAL HIP ARTHROPLASTY Right 09/22/2020   Dr. Nicholes Calamity NERVE TRANSPOSITION  09/17/2011   Procedure: ULNAR NERVE DECOMPRESSION/TRANSPOSITION;  Surgeon: Cammie Sickle., MD;  Location: [place];  Service: Orthopedics;  Laterality: Left;  decompression   WRIST ARTHROSCOPY Right    x2-fusion     WRIST ARTHROSCOPY Left     Current Medications: Current Meds  Medication Sig   aspirin EC 81 MG tablet Take 1 tablet (81 mg total) by mouth daily. Swallow whole.   Calcium-Vitamin D (CALTRATE 600 PLUS-VIT D PO) Take 1 tablet by mouth 2 (two) times daily.   cholecalciferol (VITAMIN D) 1000 UNITS tablet Take 1,000 Units by mouth daily.   cyclobenzaprine (FLEXERIL) 10 MG tablet Take 10 mg by mouth as needed.    diclofenac Sodium (VOLTAREN) 1 % GEL Apply topically.   eletriptan (RELPAX) 20 MG tablet as needed.    ENBREL SURECLICK 50 MG/ML injection Inject 50 mg into the skin once a week.   ezetimibe (ZETIA) 10 MG tablet Take 1 tablet (10 mg total) by mouth daily.   gabapentin (NEURONTIN) 300 MG capsule TAKE FOUR CAPSULES BY MOUTH DAILY  isosorbide mononitrate (IMDUR) 30 MG 24 hr tablet Take 1 tablet (30 mg total) by mouth daily.   leflunomide (ARAVA) 10 MG tablet Take 1 tablet (10 mg total) by mouth daily.   levothyroxine (SYNTHROID) 75 MCG tablet Take 75 mcg by mouth every morning.   Multiple Vitamin (MULTIVITAMIN WITH MINERALS) TABS Take 1 tablet by mouth daily.   nadolol (CORGARD) 40 MG tablet Take 1 tablet (40 mg total) by mouth every morning.   Omega-3 Fatty Acids (FISH OIL PO) Take 1 capsule by mouth daily.   ondansetron (ZOFRAN) 4 MG tablet TAKE ONE TABLET BY MOUTH EVERY 8 HOURS AS NEEDED FOR NAUSEA AND VOMITING   OVER THE COUNTER MEDICATION at bedtime. CBD   pantoprazole (PROTONIX)  40 MG tablet Take 40 mg by mouth 2 (two) times daily.    PROAIR HFA 108 (90 BASE) MCG/ACT inhaler as needed.   rosuvastatin (CRESTOR) 20 MG tablet Take 1 tablet (20 mg total) by mouth daily. Please keep upcoming appt. With Dr. Marlou Porch in Aug. In order to receive future refills. Thank You.   spironolactone (ALDACTONE) 25 MG tablet Take 1 tablet (25 mg total) by mouth daily. Please keep upcoming appt. With Dr. Marlou Porch in Aug. In order to receive future refills. Thank You.   sucralfate (CARAFATE) 1 g tablet Take 1 g by mouth as needed (stomach).    TYRVAYA 0.03 MG/ACT SOLN Place 1 spray into both nostrils 2 (two) times daily.   valACYclovir (VALTREX) 1000 MG tablet as needed.   [DISCONTINUED] amLODipine (NORVASC) 5 MG tablet Take 5 mg by mouth daily.   [DISCONTINUED] losartan (COZAAR) 100 MG tablet TAKE ONE TABLET BY MOUTH ONCE DAILY     Allergies:   Humira [adalimumab], Clinoril [sulindac], Erythromycin, Hydrocodone, Ibuprofen, Penicillins, Plaquenil [hydroxychloroquine], Sulfa antibiotics, Trimethoprim, and Vancomycin cross reactors   Social History   Socioeconomic History   Marital status: Widowed    Spouse name: Not on file   Number of children: Not on file   Years of education: Not on file   Highest education level: Not on file  Occupational History   Not on file  Tobacco Use   Smoking status: Never    Passive exposure: Never   Smokeless tobacco: Never  Vaping Use   Vaping Use: Never used  Substance and Sexual Activity   Alcohol use: Yes    Comment: rarely    Drug use: No   Sexual activity: Not on file  Other Topics Concern   Not on file  Social History Narrative   Not on file   Social Determinants of Health   Financial Resource Strain: Not on file  Food Insecurity: Not on file  Transportation Needs: Not on file  Physical Activity: Not on file  Stress: Not on file  Social Connections: Not on file     Family History: The patient's family history includes Colon cancer  in an other family member; Colon polyps in an other family member; Heart attack in her father; Heart disease in her mother; Hypothyroidism in her son; Stroke in her mother.    ROS:   Please see the history of present illness.    All other systems reviewed and are negative.   EKGs/Labs/Other Studies Reviewed:    The following studies were reviewed today:  Coronary CT  02/28/2022: FINDINGS: Image quality: Average   Noise artifact is: Reduced signal to noise   Coronary calcium score is 867, which places the patient in the 95th percentile for age and sex matched  control.   Coronary arteries: Normal coronary origins.  Right dominance.   Right Coronary Artery: Mild atherosclerotic plaque at the ostial RCA, 25-49% stenosis. Moderate mixed atherosclerotic plaque in the mid RCA, 50-69% stenosis. Patent PDA, PLA.   Left Main Coronary Artery: Minimal ostial mixed atherosclerotic plaque, <25% stenosis. Mild mixed atherosclerotic plaque in the distal LMCA extending into the ostial LAD and LCx, 25-49% stenosis.   Left Anterior Descending Coronary Artery: Mild mixed atherosclerotic plaque in the ostial and proximal LAD, 25-49% stenosis. Moderate mixed atherosclerotic plaque in the mid and distal LAD, 50-69% stenosis. Ramus intermedius covers the territory of the first diagonal. A functionally second diagonal is patent with scattered nonobstructive plaque. Third diagonal is small and patent.   Ramus intermedius: Small caliber vessel (<2 mm) with probable severe ostial and proximal mixed atherosclerotic plaque, 70-99% stenosis.   Left Circumflex Artery: Mild mixed atherosclerotic plaque in the ostial LCx, 25-49% stenosis. Minimal atherosclerotic plaque in OM1 and OM2, <25% stenosis.   Aorta: Normal size, 33 mm at the mid ascending aorta (level of the PA bifurcation) measured double oblique. Moderate mixed plaque. No dissection.   Aortic Valve: No calcifications.   Other findings:    Normal pulmonary vein drainage into the left atrium.   Normal left atrial appendage without thrombus.   Normal size of the pulmonary artery.   Probable patent foramen ovale.   IMPRESSION: 1. Moderate CAD in mid LAD, distal LAD, mid RCA, 50-69% stenosis, CADRADS 3. Total plaque volume 643 mm3. CT FFR will be performed and reported separately.   2. Probable severe ostial and proximal stenosis of small caliber ramus intermedius, 70-99% stenosis, CADRADS 4. Vessel is <2 mm.   3. Coronary calcium score is 867, which places the patient in the 95th percentile for age and sex matched control.   4. Normal coronary origins with right dominance.   5. Possible patent foramen ovale without evidence of shunting on this exam.   RECOMMENDATIONS: CAD-RADS 3. Moderate stenosis. Consider symptom-guided anti-ischemic pharmacotherapy as well as risk factor modification per guideline directed care. Additional analysis with CT FFR will be submitted.   CT FFR ANALYSIS  02/28/2022: FINDINGS: FFRct analysis was performed on the original cardiac CT angiogram dataset. Diagrammatic representation of the FFRct analysis is provided in a separate PDF document in PACS. This dictation was created using the PDF document and an interactive 3D model of the results. 3D model is not available in the EMR/PACS. Normal FFR range is >0.80. Indeterminate (grey) zone is 0.76-0.80. FFR delta of 0.13 is considered significant.   1. Left Main: FFR = 0.98   2. LAD: Proximal FFR = 0.94, mid FFR = 0.89, Distal FFR = 0.66 with delta of 0.13. 3. LCX: Proximal FFR = 0.98, distal FFR = 0.94 4. RCA: Proximal FFR = 0.91, mid FFR =0.89, Distal FFR = 0.88   IMPRESSION: 1. CT FFR analysis showed high likelihood of significant stenosis in the distal LAD, FFR 0.66 with delta of 0.79-0.66 (0.13). Vessel measures 2.5 mm proximal to the stenosis.   RECOMMENDATIONS: Guideline-directed medical therapy and aggressive risk  factor modification for secondary prevention of coronary artery disease. This lesion looks most amenable to medical therapy given very distal location. However if symptoms are refractory to medical therapy, consider coronary angiography given vessel size.   Echo  02/22/2022: 1. Left ventricular ejection fraction, by estimation, is 60 to 65%. The  left ventricle has normal function. The left ventricle has no regional  wall motion abnormalities. There is  mild concentric left ventricular  hypertrophy. Left ventricular diastolic  parameters are consistent with Grade I diastolic dysfunction (impaired  relaxation).   2. Right ventricular systolic function is normal. The right ventricular  size is normal.   3. The mitral valve is normal in structure. Trivial mitral valve  regurgitation. No evidence of mitral stenosis.   4. The aortic valve is tricuspid. Aortic valve regurgitation is not  visualized. No aortic stenosis is present.   5. The inferior vena cava is normal in size with greater than 50%  respiratory variability, suggesting right atrial pressure of 3 mmHg.  EKG:  EKG is  ordered today.  The ekg ordered today demonstrates NSR  Recent Labs: 01/02/2022: ALT 25; Hemoglobin 11.9; Platelets 221 02/08/2022: BUN 20; Creatinine, Ser 1.20; Potassium 4.2; Sodium 140  Recent Lipid Panel No results found for: "CHOL", "TRIG", "HDL", "CHOLHDL", "VLDL", "LDLCALC", "LDLDIRECT"  Physical Exam:    VS:  BP 108/60 (BP Location: Left Arm)   Pulse (!) 58   Ht '5\' 3"'$  (1.6 m)   Wt 203 lb 4 oz (92.2 kg)   SpO2 98%   BMI 36.00 kg/m     Wt Readings from Last 3 Encounters:  03/25/22 203 lb 4 oz (92.2 kg)  03/01/22 200 lb (90.7 kg)  02/08/22 197 lb 3.2 oz (89.4 kg)     GEN:  Well nourished, well developed in no acute distress HEENT: Normal NECK: No JVD; No carotid bruits LYMPHATICS: No lymphadenopathy CARDIAC: RRR, no murmurs, rubs, gallops RESPIRATORY:  Clear to auscultation without rales,  wheezing or rhonchi  ABDOMEN: Soft, non-tender, non-distended MUSCULOSKELETAL:  No edema; No deformity  SKIN: Warm and dry NEUROLOGIC:  Alert and oriented x 3 PSYCHIATRIC:  Normal affect   ASSESSMENT AND PLAN:    Unstable angina with CAD Patient with abnormal CT FFR as summarized above.  She is on multiple antianginal therapy with beta-blocker, Imdur and amlodipine.  Now with persistent chest pressure since hypotensive episode.  This gets worse with activity and has shortness of breath.  Reviewed with primary cardiologist Dr. Marlou Porch.  Given symptoms plan to proceed with cardiac catheterization. Will cancel her trip next week (letter provided).  The patient understands that risks include but are not limited to stroke (1 in 1000), death (1 in 62), kidney failure [usually temporary] (1 in 500), bleeding (1 in 200), allergic reaction [possibly serious] (1 in 200), and agrees to proceed.     2.  Dizziness Not orthostatic by vital but felt dizzy upon standing.  Also feeling dizziness on bending down.  Plan to reduce losartan and amlodipine per Dr. Marlou Porch.  Continue other antihypertensive regimen.  3.  Hyperlipidemia -Continue current statin therapy -No results found for requested labs within last 365 days.  - LDL 75 on 03/2021  4. HTN - As above    Medication Adjustments/Labs and Tests Ordered: Current medicines are reviewed at length with the patient today.  Concerns regarding medicines are outlined above.  Orders Placed This Encounter  Procedures   CBC with Differential/Platelet   Basic metabolic panel   EKG 84-ZYSA   EKG 12-Lead   Meds ordered this encounter  Medications   amLODipine (NORVASC) 2.5 MG tablet    Sig: Take 1 tablet (2.5 mg total) by mouth daily.    Dispense:  90 tablet    Refill:  3   losartan (COZAAR) 50 MG tablet    Sig: Take 1 tablet (50 mg total) by mouth daily.    Dispense:  90  tablet    Refill:  3    Patient Instructions  Medication Instructions:   Your physician has recommended you make the following change in your medication:  1-DECREASE Losartan 50 mg by mouth daily. 2-DECREASE Amlodipine 2.5 mg by mouth daily.  *If you need a refill on your cardiac medications before your next appointment, please call your pharmacy*  Lab Work: Your physician recommends that you have lab work today. BMET and CBC.  If you have labs (blood work) drawn today and your tests are completely normal, you will receive your results only by: [place] (if you have MyChart) OR A paper copy in the mail If you have any lab test that is abnormal or we need to change your treatment, we will call you to review the results.   Testing/Procedures: Your physician has requested that you have a cardiac catheterization. Cardiac catheterization is used to diagnose and/or treat various heart conditions. Doctors may recommend this procedure for a number of different reasons. The most common reason is to evaluate chest pain. Chest pain can be a symptom of coronary artery disease (CAD), and cardiac catheterization can show whether plaque is narrowing or blocking your heart's arteries. This procedure is also used to evaluate the valves, as well as measure the blood flow and oxygen levels in different parts of your heart. For further information please visit [email or web address]. Please follow instruction sheet, as given.  Follow-Up: At [facility], you and your health needs are our priority.  As part of our continuing mission to provide you with exceptional heart care, we have created designated Provider Care Teams.  These Care Teams include your primary Cardiologist (physician) and Advanced Practice Providers (APPs -  Physician Assistants and Nurse Practitioners) who all work together to provide you with the care you need, when you need it.  We recommend signing up for the patient portal called "MyChart".  Sign up information is provided on this After Visit  Summary.  MyChart is used to connect with patients for Virtual Visits (Telemedicine).  Patients are able to view lab/test results, encounter notes, upcoming appointments, etc.  Non-urgent messages can be sent to your provider as well.   To learn more about what you can do with MyChart, go to [email or web address].    Your next appointment:   2 week(s)  The format for your next appointment:   In Person  Provider:   Robbie Lis, PA-C, Nicholes Rough, PA-C, Melina Copa, PA-C, Ambrose Pancoast, NP, Cecilie Kicks, NP, Ermalinda Barrios, PA-C, Christen Bame, NP, or Richardson Dopp, PA-C     Then, Candee Furbish, MD will plan to see you again in 6 month(s).    Other Instructions [place] [facility] [place] [facility] [place], [place] [id number] [place] [address] Dept: [phone] Loc: [phone]  Kari [name]  03/25/2022  You are scheduled for a Cardiac Catheterization on Tuesday, September 26 with Dr. [name].  1. Please arrive at the [facility] (Main Entrance A) at [facility]: [address] [place], [place] [address] at 11:30 AM (This time is two hours before your procedure to ensure your preparation). Free valet parking service is available.   Special note: Every effort is made to have your procedure done on time. Please understand that emergencies sometimes delay scheduled procedures.  2. Diet: Do not eat solid foods after midnight.  The patient may  have clear liquids until 5am upon the day of the procedure.  3. Labs: You will need to have blood drawn on Monday, September 25 at [facility]. [address]  Open: 7:30am - 5pm    Phone: [phone]. You do not need to be fasting.  4. Medication instructions in preparation for your procedure:   Contrast Allergy: No  Stop taking, Cozaar (Losartan) Tuesday,  September 26,, Aldactone  Tuesday, September 26,  On the morning of your procedure, take your Aspirin 81 mg and any morning medicines NOT listed above.  You may use sips of water.  5. Plan for one night stay--bring personal belongings. 6. Bring a current list of your medications and current insurance cards. 7. You MUST have a responsible person to drive you home. 8. Someone MUST be with you the first 24 hours after you arrive home or your discharge will be delayed. 9. Please wear clothes that are easy to get on and off and wear slip-on shoes.  Thank you for allowing [place] to care for you!   -- [facility] Invasive Cardiovascular services   Important Information About Sugar         Jarrett Soho, PA  03/25/2022 10:29 AM    [place]

## 2022-03-26 ENCOUNTER — Ambulatory Visit (HOSPITAL_COMMUNITY)
Admission: RE | Disposition: A | Discharge status: Home / Self Care | Payer: Self-pay | Source: Ambulatory Visit | Attending: Interventional Cardiology

## 2022-03-26 ENCOUNTER — Other Ambulatory Visit: Payer: Self-pay

## 2022-03-26 ENCOUNTER — Ambulatory Visit (HOSPITAL_COMMUNITY)
Admission: RE | Admit: 2022-03-26 | Discharge: 2022-03-26 | Disposition: A | Discharge status: Home / Self Care | Payer: PPO | Source: Ambulatory Visit | Attending: Interventional Cardiology | Admitting: Interventional Cardiology

## 2022-03-26 DIAGNOSIS — K219 Gastro-esophageal reflux disease without esophagitis: Secondary | ICD-10-CM | POA: Diagnosis not present

## 2022-03-26 DIAGNOSIS — I25110 Atherosclerotic heart disease of native coronary artery with unstable angina pectoris: Secondary | ICD-10-CM | POA: Diagnosis not present

## 2022-03-26 DIAGNOSIS — M069 Rheumatoid arthritis, unspecified: Secondary | ICD-10-CM | POA: Insufficient documentation

## 2022-03-26 DIAGNOSIS — I2584 Coronary atherosclerosis due to calcified coronary lesion: Secondary | ICD-10-CM | POA: Diagnosis not present

## 2022-03-26 DIAGNOSIS — I2510 Atherosclerotic heart disease of native coronary artery without angina pectoris: Secondary | ICD-10-CM

## 2022-03-26 DIAGNOSIS — I10 Essential (primary) hypertension: Secondary | ICD-10-CM | POA: Diagnosis not present

## 2022-03-26 DIAGNOSIS — E785 Hyperlipidemia, unspecified: Secondary | ICD-10-CM | POA: Insufficient documentation

## 2022-03-26 DIAGNOSIS — R42 Dizziness and giddiness: Secondary | ICD-10-CM | POA: Diagnosis not present

## 2022-03-26 DIAGNOSIS — R072 Precordial pain: Secondary | ICD-10-CM | POA: Diagnosis present

## 2022-03-26 DIAGNOSIS — M797 Fibromyalgia: Secondary | ICD-10-CM | POA: Insufficient documentation

## 2022-03-26 HISTORY — PX: LEFT HEART CATH AND CORONARY ANGIOGRAPHY: CATH118249

## 2022-03-26 SURGERY — LEFT HEART CATH AND CORONARY ANGIOGRAPHY
Anesthesia: LOCAL

## 2022-03-26 MED ORDER — SODIUM CHLORIDE 0.9% FLUSH: 3.0000 mL | Freq: Two times a day (BID) | INTRAVENOUS | Status: DC

## 2022-03-26 MED ORDER — LIDOCAINE HCL (PF) 1 % IJ SOLN
INTRAMUSCULAR | Status: AC
  Filled 2022-03-26: qty 30

## 2022-03-26 MED ORDER — HEPARIN (PORCINE) IN NACL 1000-0.9 UT/500ML-% IV SOLN
INTRAVENOUS | Status: DC | PRN
  Administered 2022-03-26: 500 mL

## 2022-03-26 MED ORDER — HYDRALAZINE HCL 20 MG/ML IJ SOLN: 10.0000 mg | INTRAMUSCULAR | Status: DC | PRN

## 2022-03-26 MED ORDER — NITROGLYCERIN 1 MG/10 ML FOR IR/CATH LAB
INTRA_ARTERIAL | Status: DC | PRN
  Administered 2022-03-26 (×2): 200 ug via INTRA_ARTERIAL

## 2022-03-26 MED ORDER — MIDAZOLAM HCL 2 MG/2ML IJ SOLN
INTRAMUSCULAR | Status: AC
  Filled 2022-03-26: qty 2

## 2022-03-26 MED ORDER — LIDOCAINE HCL (PF) 1 % IJ SOLN
INTRAMUSCULAR | Status: DC | PRN
  Administered 2022-03-26: 2 mL

## 2022-03-26 MED ORDER — HEPARIN (PORCINE) IN NACL 1000-0.9 UT/500ML-% IV SOLN
INTRAVENOUS | Status: AC
  Filled 2022-03-26: qty 1000

## 2022-03-26 MED ORDER — LABETALOL HCL 5 MG/ML IV SOLN: 10.0000 mg | INTRAVENOUS | Status: DC | PRN

## 2022-03-26 MED ORDER — FENTANYL CITRATE (PF) 100 MCG/2ML IJ SOLN
INTRAMUSCULAR | Status: AC
  Filled 2022-03-26: qty 2

## 2022-03-26 MED ORDER — SODIUM CHLORIDE 0.9 % IV SOLN: 250.0000 mL | INTRAVENOUS | Status: DC | PRN

## 2022-03-26 MED ORDER — SODIUM CHLORIDE 0.9 % IV SOLN: INTRAVENOUS | Status: DC

## 2022-03-26 MED ORDER — NITROGLYCERIN 1 MG/10 ML FOR IR/CATH LAB
INTRA_ARTERIAL | Status: AC
  Filled 2022-03-26: qty 10

## 2022-03-26 MED ORDER — SODIUM CHLORIDE 0.9% FLUSH: 3.0000 mL | INTRAVENOUS | Status: DC | PRN

## 2022-03-26 MED ORDER — VERAPAMIL HCL 2.5 MG/ML IV SOLN
INTRAVENOUS | Status: DC | PRN
  Administered 2022-03-26 (×2): 10 mL via INTRA_ARTERIAL

## 2022-03-26 MED ORDER — HEPARIN SODIUM (PORCINE) 1000 UNIT/ML IJ SOLN
INTRAMUSCULAR | Status: AC
  Filled 2022-03-26: qty 10

## 2022-03-26 MED ORDER — SODIUM CHLORIDE 0.9 % WEIGHT BASED INFUSION: 1.0000 mL/kg/h | INTRAVENOUS | Status: DC

## 2022-03-26 MED ORDER — FENTANYL CITRATE (PF) 100 MCG/2ML IJ SOLN
INTRAMUSCULAR | Status: DC | PRN
  Administered 2022-03-26 (×4): 25 ug via INTRAVENOUS

## 2022-03-26 MED ORDER — ONDANSETRON HCL 4 MG/2ML IJ SOLN: 4.0000 mg | Freq: Four times a day (QID) | INTRAMUSCULAR | Status: DC | PRN

## 2022-03-26 MED ORDER — ACETAMINOPHEN 325 MG PO TABS: 650.0000 mg | ORAL_TABLET | ORAL | Status: DC | PRN

## 2022-03-26 MED ORDER — MIDAZOLAM HCL 2 MG/2ML IJ SOLN
INTRAMUSCULAR | Status: DC | PRN
  Administered 2022-03-26 (×2): 1 mg via INTRAVENOUS
  Administered 2022-03-26: 2 mg via INTRAVENOUS

## 2022-03-26 MED ORDER — HEPARIN SODIUM (PORCINE) 1000 UNIT/ML IJ SOLN
INTRAMUSCULAR | Status: DC | PRN
  Administered 2022-03-26: 3000 [IU] via INTRAVENOUS

## 2022-03-26 MED ORDER — SODIUM CHLORIDE 0.9 % WEIGHT BASED INFUSION
3.0000 mL/kg/h | INTRAVENOUS | Status: AC
  Administered 2022-03-26: 3 mL/kg/h via INTRAVENOUS

## 2022-03-26 MED ORDER — VERAPAMIL HCL 2.5 MG/ML IV SOLN
INTRAVENOUS | Status: AC
  Filled 2022-03-26: qty 2

## 2022-03-26 MED ORDER — IOHEXOL 350 MG/ML SOLN
INTRAVENOUS | Status: DC | PRN
  Administered 2022-03-26: 70 mL

## 2022-03-26 SURGICAL SUPPLY — 12 items
CATH 5FR JL3.5 JR4 ANG PIG MP (CATHETERS) IMPLANT
CATH INFINITI 4FR 3 DRC (CATHETERS) IMPLANT
CATH INFINITI 4FR JL3.5 (CATHETERS) IMPLANT
DEVICE RAD COMP TR BAND LRG (VASCULAR PRODUCTS) IMPLANT
GLIDESHEATH SLEND SS 6F .021 (SHEATH) IMPLANT
GUIDEWIRE INQWIRE 1.5J.035X260 (WIRE) IMPLANT
INQWIRE 1.5J .035X260CM (WIRE) ×1
KIT HEART LEFT (KITS) ×2 IMPLANT
PACK CARDIAC CATHETERIZATION (CUSTOM PROCEDURE TRAY) ×2 IMPLANT
TRANSDUCER W/STOPCOCK (MISCELLANEOUS) ×2 IMPLANT
TUBING CIL FLEX 10 FLL-RA (TUBING) ×2 IMPLANT
WIRE HI TORQ VERSACORE-J 145CM (WIRE) IMPLANT

## 2022-03-26 NOTE — Discharge Instructions (Signed)

## 2022-03-26 NOTE — Interval H&P Note (Signed)
Cath Lab Visit (complete for each Cath Lab visit)  Clinical Evaluation Leading to the Procedure:   ACS: No.  Non-ACS:    Anginal Classification: CCS II  Anti-ischemic medical therapy: Maximal Therapy (2 or more classes of medications)  Non-Invasive Test Results: Intermediate-risk stress test findings: cardiac mortality 1-3%/year  Prior CABG: No previous CABG      History and Physical Interval Note:  03/26/2022 1:32 PM  Kari [name]  has presented today for surgery, with the diagnosis of chest pain.  The various methods of treatment have been discussed with the patient and family. After consideration of risks, benefits and other options for treatment, the patient has consented to  Procedure(s): LEFT HEART CATH AND CORONARY ANGIOGRAPHY (N/A) as a surgical intervention.  The patient's history has been reviewed, patient examined, no change in status, stable for surgery.  I have reviewed the patient's chart and labs.  Questions were answered to the patient's satisfaction.     Kari [name]

## 2022-03-27 ENCOUNTER — Encounter (HOSPITAL_COMMUNITY): Payer: Self-pay | Admitting: Interventional Cardiology

## 2022-03-27 MED FILL — Heparin Sod (Porcine)-NaCl IV Soln 1000 Unit/500ML-0.9%: INTRAVENOUS | Qty: 500 | Status: AC

## 2022-04-01 ENCOUNTER — Other Ambulatory Visit: Payer: Self-pay | Admitting: *Deleted

## 2022-04-01 DIAGNOSIS — Z79899 Other long term (current) drug therapy: Secondary | ICD-10-CM

## 2022-04-11 ENCOUNTER — Other Ambulatory Visit: Payer: Self-pay | Admitting: *Deleted

## 2022-04-11 ENCOUNTER — Ambulatory Visit: Payer: PPO | Attending: Physician Assistant | Admitting: Nurse Practitioner

## 2022-04-11 ENCOUNTER — Other Ambulatory Visit: Payer: Self-pay | Admitting: Rheumatology

## 2022-04-11 ENCOUNTER — Encounter: Payer: Self-pay | Admitting: Physician Assistant

## 2022-04-11 VITALS — BP 128/72 | HR 60 | Ht 63.0 in | Wt 200.0 lb

## 2022-04-11 DIAGNOSIS — Z79899 Other long term (current) drug therapy: Secondary | ICD-10-CM | POA: Diagnosis not present

## 2022-04-11 DIAGNOSIS — I2510 Atherosclerotic heart disease of native coronary artery without angina pectoris: Secondary | ICD-10-CM | POA: Diagnosis not present

## 2022-04-11 DIAGNOSIS — D649 Anemia, unspecified: Secondary | ICD-10-CM | POA: Diagnosis not present

## 2022-04-11 DIAGNOSIS — I10 Essential (primary) hypertension: Secondary | ICD-10-CM

## 2022-04-11 DIAGNOSIS — E785 Hyperlipidemia, unspecified: Secondary | ICD-10-CM | POA: Diagnosis not present

## 2022-04-11 DIAGNOSIS — R42 Dizziness and giddiness: Secondary | ICD-10-CM

## 2022-04-11 DIAGNOSIS — Z6835 Body mass index (BMI) 35.0-35.9, adult: Secondary | ICD-10-CM | POA: Diagnosis not present

## 2022-04-11 DIAGNOSIS — N1831 Chronic kidney disease, stage 3a: Secondary | ICD-10-CM | POA: Diagnosis not present

## 2022-04-11 DIAGNOSIS — E669 Obesity, unspecified: Secondary | ICD-10-CM

## 2022-04-11 DIAGNOSIS — E6609 Other obesity due to excess calories: Secondary | ICD-10-CM

## 2022-04-11 DIAGNOSIS — M0579 Rheumatoid arthritis with rheumatoid factor of multiple sites without organ or systems involvement: Secondary | ICD-10-CM

## 2022-04-11 MED ORDER — GABAPENTIN 300 MG PO CAPS: ORAL_CAPSULE | ORAL | 0 refills | Status: DC

## 2022-04-11 MED ORDER — LEFLUNOMIDE 10 MG PO TABS: 10.0000 mg | ORAL_TABLET | Freq: Every day | ORAL | 0 refills | Status: DC

## 2022-04-11 NOTE — Telephone Encounter (Signed)
Next Visit: 07/18/2022  Last Visit: 01/28/2022  Last Fill: 01/04/2022  DX: Rheumatoid arthritis with rheumatoid factor of multiple sites without organ or systems involvement History of neuropathy   Current Dose per office note 01/28/2022: arava 10 mg 1 tablet by mouth daily gabapentin 300 mg 4 capsules daily.     Labs: 03/25/2022 RBC 3.31, Hgb 10.8, Hct 32.1, Glucose 105, Creat. 1.17, GFR 49  Okay to refill Arava and Gabapentin?

## 2022-04-11 NOTE — Patient Instructions (Addendum)
Medication Instructions:  Your physician recommends that you continue on your current medications as directed. Please refer to the Current Medication list given to you today. *If you need a refill on your cardiac medications before your next appointment, please call your pharmacy*   Lab Work: None ordered   Testing/Procedures: None ordered   Follow-Up: At [facility], you and your health needs are our priority.  As part of our continuing mission to provide you with exceptional heart care, we have created designated Provider Care Teams.  These Care Teams include your primary Cardiologist (physician) and Advanced Practice Providers (APPs -  Physician Assistants and Nurse Practitioners) who all work together to provide you with the care you need, when you need it.  We recommend signing up for the patient portal called "MyChart".  Sign up information is provided on this After Visit Summary.  MyChart is used to connect with patients for Virtual Visits (Telemedicine).  Patients are able to view lab/test results, encounter notes, upcoming appointments, etc.  Non-urgent messages can be sent to your provider as well.   To learn more about what you can do with MyChart, go to [email or web address].    Your next appointment:   5-6 month(s)  The format for your next appointment:   In Person  Provider:   Candee Furbish, MD     Other Instructions Mediterranean diet  Salty 6 handout  Blood pressure log given   Important Information About Sugar

## 2022-04-11 NOTE — Telephone Encounter (Signed)
Patient requested prescription refills of Leflunomide and Gabapentin to be sent to [facility].  Patient requested prescription refill of Enbrel [facility]

## 2022-04-11 NOTE — Progress Notes (Addendum)
Cardiology Office Note:    Date:  04/11/2022   ID:  Kari [name], DOB 12-25-1950, MRN [id number]  PCP:  [facility] [place] Providers Cardiologist:  Candee Furbish, MD     Referring MD: [facility]   CC: Here for ED follow-up  History of Present Illness:    Kari [name] is a 71 y.o. female with a hx of the following:  Hx of unstable angina CAD HTN HLD GERD Fibromyalgia History of dizziness/hypotension  In August 2023, she underwent coronary CTA that revealed moderate CAD in the mid LAD, distal LAD, and mid RCA, 50 to 69% stenosis.  Probable severe ostial and proximal stenosis of small caliber ramus intermedius, around 70 to 99% stenosis.  CT FFR revealed 0.66 FFR with delta of 0.79-0.76 (0.13) and distal LAD.  Medical therapy recommended.  Echocardiogram revealed EF of 60 to 65% and grade 1 DD.  Last seen by Leanor Kail, PA-C on 03/25/2022.  Reported an episode of dizziness a few weeks ago while at [employer].  Weather was hot and was evaluated at [facility].  BP was in the low 70s.  Received fluids and felt better.  Declined ER evaluation, but since then stated she was not feeling well.  Reported 5/10 chest discomfort constant, worsened with activity.  Associated symptoms included shortness of breath and dizziness with standing and bending.  Staying well-hydrated and compliant with medication.  Orthostatics negative at office visit, reduced amlodipine and losartan per Dr. Marlou Porch.  Underwent cardiac cath the following day and revealed 50% stenosis of mid RCA with 25% stenosis of distal LAD, EF 55 to 65%.  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  Mild to moderate nonobstructive CAD, ostial ramus did not appear to have significant stenosis.   Medical therapy recommended.  Dr. Irish Lack stated if angina worsens, would perform cardiac cath from femoral approach.   Today she presents for follow-up and states she is doing very well from a cardiac perspective.  Chest pain has been resolved, no more chest pain since.  Says she is feeling a lot better after her heart catheterization and has more energy.  She is planning on losing some weight and increasing her physical activity.  Denies any shortness of breath, palpitations, syncope, presyncope, dizziness, orthopnea, PND, bleeding, or claudication.  She shows me her blood pressure log and it is very well controlled.  Average SBP 120s to 130s.  Continues to have some dizziness while bending over, this is chronic, but says this is getting better.  Does own some compression stockings and plans on wearing these to improve this.  She will be seeing her rheumatologist soon, and would like to get blood work done at that appointment.  Denies any other questions or concerns today.   Past Medical History:  Diagnosis Date   Arthritis    Complication of anesthesia    when block attempted ax -turned red in preop-surg delayed   Fibromyalgia    GERD (gastroesophageal reflux disease)    Hypertension    Hypothyroidism    MVP (mitral valve prolapse)    Osteoporosis    Rheumatoid aortitis    ? arthritis    Past Surgical History:  Procedure Laterality Date   ABDOMINAL HYSTERECTOMY     CESAREAN SECTION     CHOLECYSTECTOMY  COLONOSCOPY     JOINT REPLACEMENT     KNEE ARTHROSCOPY Right 2/11   LEFT HEART CATH AND CORONARY ANGIOGRAPHY N/A 03/26/2022   Procedure: LEFT HEART CATH AND CORONARY ANGIOGRAPHY;  Surgeon: Jettie Booze, MD;  Location: [place] [facility];  Service: Cardiovascular;  Laterality: N/A;   TOTAL HIP ARTHROPLASTY Left 02/03/2013   Procedure: TOTAL HIP ARTHROPLASTY;  Surgeon: Kerin Salen, MD;  Location: [place];  Service: Orthopedics;  Laterality: Left;   TOTAL HIP ARTHROPLASTY  Right 09/22/2020   Dr. Nicholes Calamity NERVE TRANSPOSITION  09/17/2011   Procedure: ULNAR NERVE DECOMPRESSION/TRANSPOSITION;  Surgeon: Cammie Sickle., MD;  Location: [place];  Service: Orthopedics;  Laterality: Left;  decompression   WRIST ARTHROSCOPY Right    x2-fusion     WRIST ARTHROSCOPY Left     Current Medications: Current Meds  Medication Sig   amLODipine (NORVASC) 2.5 MG tablet Take 1 tablet (2.5 mg total) by mouth daily.   aspirin EC 81 MG tablet Take 1 tablet (81 mg total) by mouth daily. Swallow whole.   Calcium Citrate-Vitamin D (CALCIUM + D PO) Take 1 tablet by mouth daily.   cetirizine (ALLERGY, CETIRIZINE,) 10 MG tablet Take 10 mg by mouth 3 (three) times a week. Tues, Wed, and Thurs   Cholecalciferol (VITAMIN D) 50 MCG (2000 UT) tablet Take 2,000 Units by mouth daily.   clindamycin (CLEOCIN) 300 MG capsule Take 600 mg by mouth See admin instructions. Take 600 mg 1 hour prior to dental work   diclofenac Sodium (VOLTAREN) 1 % GEL Apply 1 Application topically 4 (four) times daily as needed (pain).   ENBREL SURECLICK 50 MG/ML injection Inject 50 mg into the skin once a week.   ezetimibe (ZETIA) 10 MG tablet Take 1 tablet (10 mg total) by mouth daily.   fluticasone (FLONASE) 50 MCG/ACT nasal spray Place 1 spray into both nostrils daily for 3 days.   gabapentin (NEURONTIN) 300 MG capsule TAKE FOUR CAPSULES BY MOUTH DAILY   ibuprofen (ADVIL) 200 MG tablet Take 200 mg by mouth every 6 (six) hours as needed for moderate pain.   isosorbide mononitrate (IMDUR) 30 MG 24 hr tablet Take 1 tablet (30 mg total) by mouth daily.   leflunomide (ARAVA) 10 MG tablet Take 1 tablet (10 mg total) by mouth daily.   levothyroxine (SYNTHROID) 75 MCG tablet Take 75 mcg by mouth every morning.   loratadine (CLARITIN) 10 MG tablet Take 10 mg by mouth 4 (four) times a week. Sat, Sun, Mon, and Fri   losartan (COZAAR) 50 MG tablet Take 1 tablet (50 mg total) by mouth daily.    Multiple Vitamin (MULTIVITAMIN WITH MINERALS) TABS Take 1 tablet by mouth daily.   nadolol (CORGARD) 40 MG tablet Take 1 tablet (40 mg total) by mouth every morning.   Omega-3 Fatty Acids (FISH OIL) 1000 MG CAPS Take by mouth.   pantoprazole (PROTONIX) 40 MG tablet Take 40 mg by mouth 2 (two) times daily.    PROAIR HFA 108 (90 BASE) MCG/ACT inhaler Inhale 2 puffs into the lungs every 6 (six) hours as needed for shortness of breath.   rosuvastatin (CRESTOR) 20 MG tablet Take 1 tablet (20 mg total) by mouth daily.   spironolactone (ALDACTONE) 25 MG tablet Take 1 tablet (25 mg total) by mouth daily.   TYRVAYA 0.03 MG/ACT SOLN Place 1 spray into both nostrils 2 (two) times daily as needed (dry eyes).     Allergies:  Humira [adalimumab], Clinoril [sulindac], Erythromycin, Hydrocodone, Ibuprofen, Penicillins, Plaquenil [hydroxychloroquine], Sulfa antibiotics, Trimethoprim, and Vancomycin cross reactors   Social History   Socioeconomic History   Marital status: Widowed    Spouse name: Not on file   Number of children: Not on file   Years of education: Not on file   Highest education level: Not on file  Occupational History   Not on file  Tobacco Use   Smoking status: Never    Passive exposure: Never   Smokeless tobacco: Never  Vaping Use   Vaping Use: Never used  Substance and Sexual Activity   Alcohol use: Yes    Comment: rarely    Drug use: No   Sexual activity: Not on file  Other Topics Concern   Not on file  Social History Narrative   Not on file   Social Determinants of Health   Financial Resource Strain: Not on file  Food Insecurity: Not on file  Transportation Needs: Not on file  Physical Activity: Not on file  Stress: Not on file  Social Connections: Not on file     Family History: The patient's family history includes Colon cancer in an other family member; Colon polyps in an other family member; Heart attack in her father; Heart disease in her mother;  Hypothyroidism in her son; Stroke in her mother.  ROS:   Review of Systems  Constitutional: Negative.   HENT: Negative.    Eyes: Negative.   Respiratory: Negative.    Cardiovascular:  Negative for chest pain, palpitations, orthopnea, claudication, leg swelling and PND.  Gastrointestinal: Negative.   Genitourinary: Negative.   Musculoskeletal:  Positive for myalgias. Negative for back pain, falls, joint pain and neck pain.       Does have some right upper arm soreness from recent cardiac catheterization.  Denies any hematoma, pseudoaneurysm, or any other issues.  Skin: Negative.   Neurological:  Positive for dizziness. Negative for tingling, tremors, sensory change, speech change, focal weakness, seizures, loss of consciousness, weakness and headaches.       See HPI.  Endo/Heme/Allergies: Negative.   Psychiatric/Behavioral: Negative.      Please see the history of present illness.    All other systems reviewed and are negative.  EKGs/Labs/Other Studies Reviewed:    The following studies were reviewed today:   EKG:  EKG is not ordered.   Left heart cath and coronary angiography on March 26, 2022:   Mid RCA lesion is 50% stenosed.   Dist LAD lesion is 25% stenosed.   The left ventricular systolic function is normal.   LV end diastolic pressure is normal.   The left ventricular ejection fraction is 55-65% by visual estimate.   There is no aortic valve stenosis.   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  4 French catheters used with difficulty.  Would not use radial approach in the future.  Would use femoral approach if cath was needed in the future.   Mild to moderate nonobstructive disease.  Ostial ramus does not appear to have significant stenosis.   Patient stated that her chest discomfort has been improving.  No clear target for PCI.  Continue medical therapy.  If angina worsens, would perform cardiac cath from femoral approach.    Coronary CTA  with FFR on February 28, 2022: Coronary calcium score is 867, which places the patient in the 95th percentile for age and sex matched control.   Coronary arteries: Normal coronary origins.  Right dominance.  Right Coronary Artery: Mild atherosclerotic plaque at the ostial RCA, 25-49% stenosis. Moderate mixed atherosclerotic plaque in the mid RCA, 50-69% stenosis. Patent PDA, PLA.   Left Main Coronary Artery: Minimal ostial mixed atherosclerotic plaque, <25% stenosis. Mild mixed atherosclerotic plaque in the distal LMCA extending into the ostial LAD and LCx, 25-49% stenosis.   Left Anterior Descending Coronary Artery: Mild mixed atherosclerotic plaque in the ostial and proximal LAD, 25-49% stenosis. Moderate mixed atherosclerotic plaque in the mid and distal LAD, 50-69% stenosis. Ramus intermedius covers the territory of the first diagonal. A functionally second diagonal is patent with scattered nonobstructive plaque. Third diagonal is small and patent.   Ramus intermedius: Small caliber vessel (<2 mm) with probable severe ostial and proximal mixed atherosclerotic plaque, 70-99% stenosis.   Left Circumflex Artery: Mild mixed atherosclerotic plaque in the ostial LCx, 25-49% stenosis. Minimal atherosclerotic plaque in OM1 and OM2, <25% stenosis.   Aorta: Normal size, 33 mm at the mid ascending aorta (level of the PA bifurcation) measured double oblique. Moderate mixed plaque. No dissection.   Aortic Valve: No calcifications.   Other findings:   Normal pulmonary vein drainage into the left atrium.   Normal left atrial appendage without thrombus.   Normal size of the pulmonary artery.   Probable patent foramen ovale.   IMPRESSION: 1. Moderate CAD in mid LAD, distal LAD, mid RCA, 50-69% stenosis, CADRADS 3. Total plaque volume 643 mm3. CT FFR will be performed and reported separately.   2. Probable severe ostial and proximal stenosis of small caliber ramus intermedius,  70-99% stenosis, CADRADS 4. Vessel is <2 mm.   3. Coronary calcium score is 867, which places the patient in the 95th percentile for age and sex matched control.   4. Normal coronary origins with right dominance.   5. Possible patent foramen ovale without evidence of shunting on this exam.   RECOMMENDATIONS: CAD-RADS 3. Moderate stenosis. Consider symptom-guided anti-ischemic pharmacotherapy as well as risk factor modification per guideline directed care. Additional analysis with CT FFR will be submitted.  1. CT FFR analysis showed high likelihood of significant stenosis in the distal LAD, FFR 0.66 with delta of 0.79-0.66 (0.13). Vessel measures 2.5 mm proximal to the stenosis.    2D echocardiogram on February 22, 2022:  1. Left ventricular ejection fraction, by estimation, is 60 to 65%. The  left ventricle has normal function. The left ventricle has no regional  wall motion abnormalities. There is mild concentric left ventricular  hypertrophy. Left ventricular diastolic  parameters are consistent with Grade I diastolic dysfunction (impaired  relaxation).   2. Right ventricular systolic function is normal. The right ventricular  size is normal.   3. The mitral valve is normal in structure. Trivial mitral valve  regurgitation. No evidence of mitral stenosis.   4. The aortic valve is tricuspid. Aortic valve regurgitation is not  visualized. No aortic stenosis is present.   5. The inferior vena cava is normal in size with greater than 50%  respiratory variability, suggesting right atrial pressure of 3 mmHg.    Recent Labs: 01/02/2022: ALT 25 03/25/2022: BUN 17; Creatinine, Ser 1.17; Hemoglobin 10.8; Platelets 210; Potassium 3.9; Sodium 141  Recent Lipid Panel No results found for: "CHOL", "TRIG", "HDL", "CHOLHDL", "VLDL", "LDLCALC", "LDLDIRECT"   Physical Exam:    VS:  BP 128/72   Pulse 60   Ht _0  (1.6 m)   Wt 200 lb (90.7 kg)   SpO2 96%   BMI 35.43 kg/m  Wt  Readings from Last 3 Encounters:  04/11/22 200 lb (90.7 kg)  03/26/22 198 lb (89.8 kg)  03/25/22 203 lb 4 oz (92.2 kg)     GEN: Obese, 71 y.o.  Caucasian female in NAD  HEENT: Normal NECK: No JVD; No carotid bruits CARDIAC: S1/S2, RRR, no murmurs, rubs, gallops; 2+ peripheral pulses throughout, strong and equal bilaterally, no sign of hematoma, erythema, pseudoaneurysm, or swelling of right wrist or right upper arm from a cardiac catheterization. RESPIRATORY:  Clear and diminished to auscultation without rales, wheezing or rhonchi  MUSCULOSKELETAL: +1 BLE, otherwise normal; No deformity  SKIN: Warm and dry NEUROLOGIC:  Alert and oriented x 3 PSYCHIATRIC:  Normal affect   ASSESSMENT:    1. Coronary artery disease involving native heart without angina pectoris, unspecified vessel or lesion type   2. Hyperlipidemia, unspecified hyperlipidemia type   3. Hypertension, unspecified type   4. Dizziness   5. Stage 3a chronic kidney disease (CKD) ([place])   6. Anemia, unspecified type   7. Class 2 obesity due to excess calories with body mass index (BMI) of 35.0 to 35.9 in adult, unspecified whether serious comorbidity present    PLAN:    In order of problems listed above:  CAD Recent cardiac cath revealed mild to moderate nonobstructive disease.  Stable with no anginal symptoms since heart catheterization.  If she were to have worsening angina or recurrent angina and cardiac catheterization would be warranted, would need formal approach, due to severe right radial artery and right brachial artery vasospasm.  No complications such as pseudoaneurysm, hematoma, or AV fistula noted along right upper extremity, soreness on right upper arm sounds MSK in nature.  Recommended Tylenol 1,000 mg twice daily.  Continue amlodipine, aspirin, Zetia, Imdur, losartan, nadolol, and Crestor. Heart healthy diet and regular cardiovascular exercise encouraged.   Hyperlipidemia Last LDL from 1 year ago 75, total  cholesterol 144.  Recently started on Crestor.  Plan to recheck lipid panel and hepatic function panel in 6 to 8 weeks, around early November 2023. Heart healthy diet and regular cardiovascular exercise encouraged.  Continue Crestor and Zetia.   Hypertension BP today 128/72.  BP log shows well-controlled readings.  Continue current medication regimen.  At this time, she defers any follow-up labs and wants to have this drawn at rheumatology office.  Recommend rechecking BMET to evaluate kidney function. Heart healthy diet and regular cardiovascular exercise encouraged.  Continue current medication regimen. BP log given today.   Dizziness This has improved since last OV.  Recommended compression stockings and she plans to initiate this to help improve the symptoms. Orthostatics negative at last OV.  Conservative measures discussed.  5. Stage 3a CKD Last serum creatinine 1.17, eGFR 49.  Would like to check kidney function since receiving contrast dye during recent cardiac cath.  She defers lab work at this time, and is requesting to have this drawn at upcoming rheumatology office visit. Recommend repeat BMET at this visit.   6. Anemia Last CBC revealed hemoglobin 10.8, hematocrit 32.1.  She defers any repeat blood work at this time.  She denies any issues of bleeding since cardiac catheterization.  Recommend repeating CBC at next rheumatology office visit.  7. Class II obesity Weight loss via diet and exercise encouraged. Discussed the impact being overweight would have on cardiovascular risk.  Plans on doing water aerobics, and I mention that this sounds like a very good idea. She is also going to check out [place].   8.  Disposition: Follow-up with Dr. Marlou Porch  in 5-6 months or sooner if anything changes.       Medication Adjustments/Labs and Tests Ordered: Current medicines are reviewed at length with the patient today.  Concerns regarding medicines are outlined above.  No orders of the  defined types were placed in this encounter.  No orders of the defined types were placed in this encounter.   Patient Instructions  Medication Instructions:  Your physician recommends that you continue on your current medications as directed. Please refer to the Current Medication list given to you today. *If you need a refill on your cardiac medications before your next appointment, please call your pharmacy*   Lab Work: None ordered   Testing/Procedures: None ordered   Follow-Up: At [facility], you and your health needs are our priority.  As part of our continuing mission to provide you with exceptional heart care, we have created designated Provider Care Teams.  These Care Teams include your primary Cardiologist (physician) and Advanced Practice Providers (APPs -  Physician Assistants and Nurse Practitioners) who all work together to provide you with the care you need, when you need it.  We recommend signing up for the patient portal called "MyChart".  Sign up information is provided on this After Visit Summary.  MyChart is used to connect with patients for Virtual Visits (Telemedicine).  Patients are able to view lab/test results, encounter notes, upcoming appointments, etc.  Non-urgent messages can be sent to your provider as well.   To learn more about what you can do with MyChart, go to [email or web address].    Your next appointment:   5-6 month(s)  The format for your next appointment:   In Person  Provider:   Candee Furbish, MD     Other Instructions Mediterranean diet  Salty 6 handout  Blood pressure log given   Important Information About Sugar         Signed, Finis Bud, NP  04/11/2022 9:49 AM    [place]

## 2022-04-12 LAB — HEPATIC FUNCTION PANEL
AG Ratio: 2.1 (calc) (ref 1.0–2.5)
ALT: 21 U/L (ref 6–29)
AST: 22 U/L (ref 10–35)
Albumin: 4.6 g/dL (ref 3.6–5.1)
Alkaline phosphatase (APISO): 72 U/L (ref 37–153)
Bilirubin, Direct: 0.2 mg/dL (ref 0.0–0.2)
Globulin: 2.2 g/dL (calc) (ref 1.9–3.7)
Indirect Bilirubin: 0.4 mg/dL (calc) (ref 0.2–1.2)
Total Bilirubin: 0.6 mg/dL (ref 0.2–1.2)
Total Protein: 6.8 g/dL (ref 6.1–8.1)

## 2022-04-12 NOTE — Progress Notes (Signed)
LFTs are normal

## 2022-04-16 DIAGNOSIS — E039 Hypothyroidism, unspecified: Secondary | ICD-10-CM | POA: Diagnosis not present

## 2022-04-16 DIAGNOSIS — M797 Fibromyalgia: Secondary | ICD-10-CM | POA: Diagnosis not present

## 2022-04-16 DIAGNOSIS — K219 Gastro-esophageal reflux disease without esophagitis: Secondary | ICD-10-CM | POA: Diagnosis not present

## 2022-04-16 DIAGNOSIS — I10 Essential (primary) hypertension: Secondary | ICD-10-CM | POA: Diagnosis not present

## 2022-04-16 DIAGNOSIS — Z23 Encounter for immunization: Secondary | ICD-10-CM | POA: Diagnosis not present

## 2022-04-16 DIAGNOSIS — Z6836 Body mass index (BMI) 36.0-36.9, adult: Secondary | ICD-10-CM | POA: Diagnosis not present

## 2022-04-16 DIAGNOSIS — Z5181 Encounter for therapeutic drug level monitoring: Secondary | ICD-10-CM | POA: Diagnosis not present

## 2022-04-16 DIAGNOSIS — M069 Rheumatoid arthritis, unspecified: Secondary | ICD-10-CM | POA: Diagnosis not present

## 2022-04-29 DIAGNOSIS — Z0000 Encounter for general adult medical examination without abnormal findings: Secondary | ICD-10-CM | POA: Diagnosis not present

## 2022-04-30 ENCOUNTER — Telehealth: Payer: Self-pay | Admitting: Pharmacist

## 2022-04-30 ENCOUNTER — Other Ambulatory Visit: Payer: Self-pay | Admitting: *Deleted

## 2022-04-30 MED ORDER — ENBREL SURECLICK 50 MG/ML ~~LOC~~ SOAJ: 50.0000 mg | SUBCUTANEOUS | 0 refills | Status: DC

## 2022-04-30 NOTE — Telephone Encounter (Signed)
Patient dropped off Enbrel PAP renewal application for [employer].  Will need prior authorization through [employer] renewed for submission of approval letter with renewal application. Please resubmit PA for Enbrel through insurance  Provider portion placed in Dr. Arlean Hopping folder for signature  Knox Saliva, PharmD, MPH, BCPS, CPP Clinical Pharmacist (Rheumatology and Pulmonology)

## 2022-04-30 NOTE — Telephone Encounter (Signed)
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 1.17 GFR 49  TB Gold: 01/02/2022 Neg    Okay to refill Enbrel?

## 2022-05-02 ENCOUNTER — Other Ambulatory Visit (HOSPITAL_COMMUNITY): Payer: Self-pay

## 2022-05-03 NOTE — Telephone Encounter (Signed)
Received notification from  [employer]  regarding a prior authorization for ENBREL. Authorization has been APPROVED from 05/03/22 to 05/04/2023.   Will attached approval letter to patient assistance application once back on site at rheum  Knox Saliva, PharmD, MPH, BCPS, CPP Clinical Pharmacist (Rheumatology and Pulmonology)

## 2022-05-06 NOTE — Telephone Encounter (Signed)
Submitted Patient Assistance RENEWAL Application to [employer] for ENBREL along with provider portion, patient portoin, med list, insurance card copy, and PA. Will update patient when we receive a response.  Fax# [phone] Phone# [phone]  Knox Saliva, PharmD, MPH, BCPS, CPP Clinical Pharmacist (Rheumatology and Pulmonology)

## 2022-05-20 ENCOUNTER — Ambulatory Visit: Payer: PPO | Admitting: Neurology

## 2022-05-20 DIAGNOSIS — M47816 Spondylosis without myelopathy or radiculopathy, lumbar region: Secondary | ICD-10-CM | POA: Diagnosis not present

## 2022-06-19 NOTE — Telephone Encounter (Signed)
[place] for update on patient's application. Application is in processing stage (was attached to old enrollment)  Fax# [phone] Phone# [phone]  Knox Saliva, PharmD, MPH, BCPS, CPP Clinical Pharmacist (Rheumatology and Pulmonology)

## 2022-06-21 DIAGNOSIS — M47816 Spondylosis without myelopathy or radiculopathy, lumbar region: Secondary | ICD-10-CM | POA: Diagnosis not present

## 2022-07-02 DIAGNOSIS — M1712 Unilateral primary osteoarthritis, left knee: Secondary | ICD-10-CM | POA: Diagnosis not present

## 2022-07-03 ENCOUNTER — Other Ambulatory Visit: Payer: Self-pay | Admitting: *Deleted

## 2022-07-03 DIAGNOSIS — Z79899 Other long term (current) drug therapy: Secondary | ICD-10-CM | POA: Diagnosis not present

## 2022-07-04 LAB — COMPLETE METABOLIC PANEL WITH GFR
AG Ratio: 2 (calc) (ref 1.0–2.5)
ALT: 20 U/L (ref 6–29)
AST: 18 U/L (ref 10–35)
Albumin: 4.5 g/dL (ref 3.6–5.1)
Alkaline phosphatase (APISO): 68 U/L (ref 37–153)
BUN/Creatinine Ratio: 17 (calc) (ref 6–22)
BUN: 22 mg/dL (ref 7–25)
CO2: 22 mmol/L (ref 20–32)
Calcium: 9.5 mg/dL (ref 8.6–10.4)
Chloride: 104 mmol/L (ref 98–110)
Creat: 1.27 mg/dL — ABNORMAL HIGH (ref 0.60–1.00)
Globulin: 2.3 g/dL (calc) (ref 1.9–3.7)
Glucose, Bld: 143 mg/dL — ABNORMAL HIGH (ref 65–99)
Potassium: 4 mmol/L (ref 3.5–5.3)
Sodium: 137 mmol/L (ref 135–146)
Total Bilirubin: 0.5 mg/dL (ref 0.2–1.2)
Total Protein: 6.8 g/dL (ref 6.1–8.1)
eGFR: 45 mL/min/{1.73_m2} — ABNORMAL LOW (ref >=60)

## 2022-07-04 LAB — CBC WITH DIFFERENTIAL/PLATELET
Absolute Monocytes: 435 cells/uL (ref 200–950)
Basophils Absolute: 15 cells/uL (ref 0–200)
Basophils Relative: 0.1 %
Eosinophils Absolute: 0 cells/uL — ABNORMAL LOW (ref 15–500)
Eosinophils Relative: 0 %
HCT: 35.8 % (ref 35.0–45.0)
Hemoglobin: 12.1 g/dL (ref 11.7–15.5)
Lymphs Abs: 972 cells/uL (ref 850–3900)
MCH: 33 pg (ref 27.0–33.0)
MCHC: 33.8 g/dL (ref 32.0–36.0)
MCV: 97.5 fL (ref 80.0–100.0)
MPV: 10 fL (ref 7.5–12.5)
Monocytes Relative: 3 %
Neutro Abs: 13079 cells/uL — ABNORMAL HIGH (ref 1500–7800)
Neutrophils Relative %: 90.2 %
Platelets: 234 10*3/uL (ref 140–400)
RBC: 3.67 10*6/uL — ABNORMAL LOW (ref 3.80–5.10)
RDW: 11.9 % (ref 11.0–15.0)
Total Lymphocyte: 6.7 %
WBC: 14.5 10*3/uL — ABNORMAL HIGH (ref 3.8–10.8)

## 2022-07-04 NOTE — Telephone Encounter (Signed)
Called [employer] for update on patient's Enbrel PAP renewal application. Per [employer], she was deemed ineligible because of her household. Rep will refax denial letter. Patient was contact via phone.  Will need to determine what was written on application and touch base with patient regarding next steps.  Knox Saliva, PharmD, MPH, BCPS, CPP Clinical Pharmacist (Rheumatology and Pulmonology)

## 2022-07-04 NOTE — Progress Notes (Signed)
I called patient and discussed lab results with her.  Patient states that she had a cortisone injection in her knee joint prior to her labs which explains elevated white cell count.  Her glucose was also elevated which was discussed.  Creatinine is elevated most likely due to diuretic use.  Please forward results to her PCP.

## 2022-07-05 NOTE — Progress Notes (Signed)
Office Visit Note  Patient: Kari [name]             Date of Birth: 1950/09/29           MRN: [id number]             PCP: [facility] @ [place] Referring: No ref. provider found Visit Date: 07/18/2022 Occupation: '@GUAROCC'$ @  Subjective:  Medication management  History of Present Illness: Kari [name] is a 72 y.o. female with history of seropositive rheumatoid arthritis, osteoarthritis and degenerative disc disease.  She states her rheumatoid arthritis is very well-controlled with the combination of Enbrel and leflunomide.  She has not had a flare in many years.  She states she is struggling to get insurance coverage on Enbrel.  Her copayment is going to be very high and she may have to discontinue Enbrel.  She had been experiencing pain and discomfort in her lower back for which she has been getting cortisone injections.  She also had a recent left knee joint injection because of favoring her back.    Activities of Daily Living:  Patient reports morning stiffness for 0 minutes.   Patient Denies nocturnal pain.  Difficulty dressing/grooming: Denies Difficulty climbing stairs: Denies Difficulty getting out of chair: Denies Difficulty using hands for taps, buttons, cutlery, and/or writing: Reports  Review of Systems  Constitutional:  Positive for fatigue.  HENT:  Positive for mouth sores and mouth dryness.   Eyes:  Positive for dryness.  Respiratory:  Negative for difficulty breathing.   Cardiovascular:  Negative for chest pain and palpitations.  Gastrointestinal:  Negative for blood in stool, constipation and diarrhea.  Endocrine: Positive for increased urination.  Genitourinary:  Negative for involuntary urination.  Musculoskeletal:  Positive for joint pain, gait problem, joint pain, myalgias, muscle weakness, muscle tenderness and myalgias. Negative for joint swelling and morning stiffness.  Skin:  Positive for rash. Negative for color change, hair loss and  sensitivity to sunlight.  Allergic/Immunologic: Negative for susceptible to infections.  Neurological:  Positive for dizziness. Negative for headaches.  Hematological:  Negative for swollen glands.  Psychiatric/Behavioral:  Positive for depressed mood. Negative for sleep disturbance. The patient is not nervous/anxious.     PMFS History:  Patient Active Problem List   Diagnosis Date Noted   Moderate, nonobstructive CAD (coronary artery disease) 04/11/2022   Dizziness with bending over 04/11/2022   Stage 3a chronic kidney disease (CKD) ([place]) 04/11/2022   Anemia 04/11/2022   Class 2 obesity 04/11/2022   Precordial chest pain    Rheumatoid arthritis with rheumatoid factor of multiple sites without organ or systems involvement ([place]) 10/17/2020   High risk medication use 10/17/2020   History of total replacement of both hip joints 10/17/2020   DDD (degenerative disc disease), lumbar 10/17/2020   Myofascial pain syndrome 10/17/2020   Age-related osteoporosis without current pathological fracture 10/17/2020   Frequent falls 10/17/2020   Hyperlipidemia 07/14/2015   Palpitation 05/11/2014   PVC (premature ventricular contraction) 05/11/2014   Hypokalemia 05/11/2014   Essential hypertension 05/11/2014   Mitral regurgitation 05/11/2014   Arthritis, hip 02/03/2013    Past Medical History:  Diagnosis Date   Anemia    Arthritis    Class 2 obesity    Complication of anesthesia    when block attempted ax -turned red in preop-surg delayed   Dizziness    Fibromyalgia    GERD (gastroesophageal reflux disease)    HLD (hyperlipidemia)    Hypertension    Hypothyroidism  Mild to moderate, nonobstructive CAD (coronary artery disease)    MVP (mitral valve prolapse)    Osteoporosis    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   Rheumatoid aortitis    ? arthritis   Stage 3a chronic kidney disease (CKD)  ([place])    Unstable angina ([place])    cath revealed moderate, nonobstructive CAD.  50% stenosis of mid RCA with 25% stenosis of distal LAD, EF 55 to 65%.    Family History  Problem Relation Age of Onset   Heart attack Father    Heart disease Mother    Stroke Mother    Colon cancer Other    Colon polyps Other    Hypothyroidism Son    Past Surgical History:  Procedure Laterality Date   ABDOMINAL HYSTERECTOMY     CESAREAN SECTION     CHOLECYSTECTOMY     COLONOSCOPY     JOINT REPLACEMENT     KNEE ARTHROSCOPY Right 2/11   LEFT HEART CATH AND CORONARY ANGIOGRAPHY N/A 03/26/2022   Procedure: LEFT HEART CATH AND CORONARY ANGIOGRAPHY;  Surgeon: Jettie Booze, MD;  Location: [facility];  Service: Cardiovascular;  Laterality: N/A;   TOTAL HIP ARTHROPLASTY Left 02/03/2013   Procedure: TOTAL HIP ARTHROPLASTY;  Surgeon: Kerin Salen, MD;  Location: [facility];  Service: Orthopedics;  Laterality: Left;   TOTAL HIP ARTHROPLASTY Right 09/22/2020   Dr. Nicholes Calamity NERVE TRANSPOSITION  09/17/2011   Procedure: ULNAR NERVE DECOMPRESSION/TRANSPOSITION;  Surgeon: Cammie Sickle., MD;  Location: [facility];  Service: Orthopedics;  Laterality: Left;  decompression   WRIST ARTHROSCOPY Right    x2-fusion     WRIST ARTHROSCOPY Left    Social History   Social History Narrative   Not on file   Immunization History  Administered Date(s) Administered   PFIZER(Purple Top)SARS-COV-2 Vaccination 08/05/2019, 08/26/2019, 02/14/2020, 01/16/2021     Objective: Vital Signs: BP 131/71 (BP Location: Left Arm, Patient Position: Sitting, Cuff Size: Large)   Pulse 62   Resp 16   Ht '5\' 2"'$  (1.575 m)   Wt 199 lb (90.3 kg)   BMI 36.40 kg/m    Physical Exam Vitals and nursing note reviewed.  Constitutional:      Appearance: She is well-developed.  HENT:     Head: Normocephalic and atraumatic.  Eyes:     Conjunctiva/sclera: Conjunctivae normal.  Cardiovascular:     Rate and Rhythm:  Normal rate and regular rhythm.     Heart sounds: Normal heart sounds.  Pulmonary:     Effort: Pulmonary effort is normal.     Breath sounds: Normal breath sounds.  Abdominal:     General: Bowel sounds are normal.     Palpations: Abdomen is soft.  Musculoskeletal:     Cervical back: Normal range of motion.  Lymphadenopathy:     Cervical: No cervical adenopathy.  Skin:    General: Skin is warm and dry.     Capillary Refill: Capillary refill takes less than 2 seconds.  Neurological:     Mental Status: She is alert and oriented to person, place, and time.  Psychiatric:        Behavior: Behavior normal.      Musculoskeletal Exam: She had limited range of motion of the cervical spine with some discomfort.  She had limited painful range of motion of her lumbar spine.  Shoulder joints, but joints were  in good range of motion.  She had limited range of motion of her right wrist joint due to previous surgery.  MCPs PIPs and DIPs been good range of motion with no synovitis.  Hip joints are replaced and were in good range of motion.  Knee joints and ankles with good range of motion without discomfort.  No warmth swelling or effusion was noted.  There was no tenderness over ankles or MTPs.  CDAI Exam: CDAI Score: -- Patient Global: 0 mm; Provider Global: 0 mm Swollen: --; Tender: -- Joint Exam 07/18/2022   No joint exam has been documented for this visit   There is currently no information documented on the homunculus. Go to the Rheumatology activity and complete the homunculus joint exam.  Investigation: No additional findings.  Imaging: No results found.  Recent Labs: Lab Results  Component Value Date   WBC 14.5 (H) 07/03/2022   HGB 12.1 07/03/2022   PLT 234 07/03/2022   NA 137 07/03/2022   K 4.0 07/03/2022   CL 104 07/03/2022   CO2 22 07/03/2022   GLUCOSE 143 (H) 07/03/2022   BUN 22 07/03/2022   CREATININE 1.27 (H) 07/03/2022   BILITOT 0.5 07/03/2022   ALKPHOS 72  01/20/2017   AST 18 07/03/2022   ALT 20 07/03/2022   PROT 6.8 07/03/2022   ALBUMIN 4.2 01/20/2017   CALCIUM 9.5 07/03/2022   GFRAA 72 12/04/2020   QFTBGOLDPLUS NEGATIVE 01/02/2022     Speciality Comments: Recieves Enbrel through [employer] Program  Procedures:  No procedures performed Allergies: Humira [adalimumab], Clinoril [sulindac], Erythromycin, Hydrocodone, Ibuprofen, Penicillins, Plaquenil [hydroxychloroquine], Sulfa antibiotics, Trimethoprim, and Vancomycin cross reactors   Assessment / Plan:     Visit Diagnoses: Rheumatoid arthritis with rheumatoid factor of multiple sites without organ or systems involvement (HCC)-she is clinically doing well on the combination of Enbrel and leflunomide.  She is struggling to get Enbrel from safety net program.  She is uncertain if she will be able to continue Enbrel in the future.  I advised her to space the Enbrel to every other week until she hears back from the safety net program.  We might be able to taper off Enbrel and see if she does well without having a flare.  She has not had a rheumatoid arthritis flare in a long time.  She believes she has been on Enbrel since 2010.  No synovitis was noted on the examination today.  High risk medication use - Enbrel 50 mg sq injections once weekly and arava 10 mg 1 tablet by mouth daily.  Labs obtained on July 03, 2022 WBC count was 14.5 due to recent steroid injection.  Creatinine was elevated at 1.27.  She was advised to get repeat labs in 3 months and then every 3 months to monitor for drug toxicity.  Information regarding immunization was placed in the AVS.  Annual skin examination to screen for skin cancer was advised.  She was advised to hold Enbrel and leflunomide if she develops an infection and resume after the infection resolves.  Elevated serum creatinine-she has had elevated serum creatinine which is gradually increasing.  She is also on spironolactone which could be contributing to  elevated creatinine.  I will refer her to nephrology for evaluation per patient's request.  Medial epicondylitis of both elbows-she has some improvement after the injection.  History of total replacement of both hip joints-she had good range of motion without discomfort.  DDD (degenerative disc disease), lumbar -she continues to have  some lower back pain.  She is followed by Dr. Ron Agee.  She gets cortisone injection as needed.  Myofascial pain syndrome-she continues to some generalized pain and discomfort.  She has hyperalgesia.  Regular exercise was emphasized.  Age-related osteoporosis without current pathological fracture - DEXA 02/21/20: Right total hip BMD 0.703 with T-score -2.0. previously treated with fosamax and forteo (completed 2 years of treatment). Reclast IV on 06/18/2021.February 21, 2022 DEXA scan T-score -1.8, BMD 0.586 left one third radius which showed -6% change from August 2021.  AP total spine T-score +0.7, 5% improvement. DEXA results were discussed with the patient at length.  We will hold off Reclast until the next DEXA scan in 2025.  Calcium rich diet, vitamin D was advised.  Vitamin D deficiency-vitamin D was normal in 21.  History of hypertension-blood pressure was normal at 131/71 today.  History of hyperlipidemia  History of hypothyroidism  History of neuropathy - gabapentin 300 mg 4 capsules daily.  Orders: No orders of the defined types were placed in this encounter.  No orders of the defined types were placed in this encounter.    Follow-Up Instructions: Return in about 5 months (around 12/17/2022) for Rheumatoid arthritis.   Bo Merino, MD  Note - This record has been created using Editor, commissioning.  Chart creation errors have been sought, but may not always  have been located. Such creation errors do not reflect on  the standard of medical care.

## 2022-07-18 ENCOUNTER — Encounter: Payer: Self-pay | Admitting: Rheumatology

## 2022-07-18 ENCOUNTER — Other Ambulatory Visit (HOSPITAL_COMMUNITY): Payer: Self-pay

## 2022-07-18 ENCOUNTER — Ambulatory Visit: Payer: PPO | Attending: Rheumatology | Admitting: Rheumatology

## 2022-07-18 VITALS — BP 131/71 | HR 62 | Resp 16 | Ht 62.0 in | Wt 199.0 lb

## 2022-07-18 DIAGNOSIS — M0579 Rheumatoid arthritis with rheumatoid factor of multiple sites without organ or systems involvement: Secondary | ICD-10-CM

## 2022-07-18 DIAGNOSIS — M5136 Other intervertebral disc degeneration, lumbar region: Secondary | ICD-10-CM

## 2022-07-18 DIAGNOSIS — M7701 Medial epicondylitis, right elbow: Secondary | ICD-10-CM | POA: Diagnosis not present

## 2022-07-18 DIAGNOSIS — M810 Age-related osteoporosis without current pathological fracture: Secondary | ICD-10-CM | POA: Diagnosis not present

## 2022-07-18 DIAGNOSIS — Z8639 Personal history of other endocrine, nutritional and metabolic disease: Secondary | ICD-10-CM | POA: Diagnosis not present

## 2022-07-18 DIAGNOSIS — Z8679 Personal history of other diseases of the circulatory system: Secondary | ICD-10-CM

## 2022-07-18 DIAGNOSIS — R7989 Other specified abnormal findings of blood chemistry: Secondary | ICD-10-CM | POA: Diagnosis not present

## 2022-07-18 DIAGNOSIS — Z8669 Personal history of other diseases of the nervous system and sense organs: Secondary | ICD-10-CM

## 2022-07-18 DIAGNOSIS — M51369 Other intervertebral disc degeneration, lumbar region without mention of lumbar back pain or lower extremity pain: Secondary | ICD-10-CM

## 2022-07-18 DIAGNOSIS — Z79899 Other long term (current) drug therapy: Secondary | ICD-10-CM | POA: Diagnosis not present

## 2022-07-18 DIAGNOSIS — M7918 Myalgia, other site: Secondary | ICD-10-CM

## 2022-07-18 DIAGNOSIS — E559 Vitamin D deficiency, unspecified: Secondary | ICD-10-CM

## 2022-07-18 DIAGNOSIS — Z96643 Presence of artificial hip joint, bilateral: Secondary | ICD-10-CM | POA: Diagnosis not present

## 2022-07-18 DIAGNOSIS — M7702 Medial epicondylitis, left elbow: Secondary | ICD-10-CM

## 2022-07-18 NOTE — Patient Instructions (Addendum)
Standing Labs We placed an order today for your standing lab work.   Please have your standing labs drawn in April and every 3 months  Please have your labs drawn 2 weeks prior to your appointment so that the provider can discuss your lab results at your appointment.  Please note that you may see your imaging and lab results in [place] before we have reviewed them. We will contact you once all results are reviewed. Please allow our office up to 72 hours to thoroughly review all of the results before contacting the office for clarification of your results.  Lab hours are:   Monday through Thursday from 8:00 am -12:30 pm and 1:00 pm-5:00 pm and Friday from 8:00 am-12:00 pm.  Please be advised, all patients with office appointments requiring lab work will take precedent over walk-in lab work.   Labs are drawn by [facility]. Please bring your co-pay at the time of your lab draw.  You may receive a bill from [facility] for your lab work.  Please note if you are on Hydroxychloroquine and and an order has been placed for a Hydroxychloroquine level, you will need to have it drawn 4 hours or more after your last dose.  If you wish to have your labs drawn at another location, please call the office 24 hours in advance so we can fax the orders.  The office is located at [address] [place], [place], [place] [address] No appointment is necessary.    If you have any questions regarding directions or hours of operation,  please call [phone].   As a reminder, please drink plenty of water prior to coming for your lab work. Thanks!  Vaccines You are taking a medication(s) that can suppress your immune system.  The following immunizations are recommended: Flu annually Covid-19  RSV Td/Tdap (tetanus, diphtheria, pertussis) every 10 years Pneumonia (Prevnar 15 then Pneumovax 23 at least 1 year apart.  Alternatively, can take Prevnar 20 without needing additional dose) Shingrix: 2 doses from 4 weeks  to 6 months apart  Please check with your PCP to make sure you are up to date.   If you have signs or symptoms of an infection or start antibiotics: First, call your PCP for workup of your infection. Hold your medication through the infection, until you complete your antibiotics, and until symptoms resolve if you take the following: Injectable medication (Actemra, Benlysta, Cimzia, Cosentyx, Enbrel, Humira, Kevzara, Orencia, Remicade, Simponi, Stelara, Taltz, Tremfya) Methotrexate Leflunomide (Arava) Mycophenolate (Cellcept) Morrie Sheldon, Olumiant, or Rinvoq

## 2022-07-18 NOTE — Addendum Note (Signed)
Addended by: Francis Gaines C on: 07/18/2022 01:04 PM   Modules accepted: Orders

## 2022-07-19 ENCOUNTER — Other Ambulatory Visit: Payer: Self-pay | Admitting: *Deleted

## 2022-07-19 DIAGNOSIS — M0579 Rheumatoid arthritis with rheumatoid factor of multiple sites without organ or systems involvement: Secondary | ICD-10-CM

## 2022-07-19 MED ORDER — GABAPENTIN 300 MG PO CAPS: ORAL_CAPSULE | ORAL | 0 refills | Status: DC

## 2022-07-19 MED ORDER — LEFLUNOMIDE 10 MG PO TABS: 10.0000 mg | ORAL_TABLET | Freq: Every day | ORAL | 0 refills | Status: DC

## 2022-07-19 NOTE — Telephone Encounter (Signed)
Patient called and requested a refill on Gabapentin and Arava to [facility].  Next Visit: 12/17/2022  Last Visit: 07/18/2022  Last Fill: 04/11/2022  DX: Rheumatoid arthritis with rheumatoid factor of multiple sites without organ or systems involvement   Current Dose per office note 07/18/2022: arava 10 mg 1 tablet by mouth daily. Gabapentin not discussed  Labs: 07/03/2022  Patient states that she had a cortisone injection in her knee joint prior to her labs which explains elevated white cell count.  Her glucose was also elevated which was discussed.  Creatinine is elevated most likely due to diuretic use   Okay to refill Arava and Gabapentin?

## 2022-08-08 ENCOUNTER — Other Ambulatory Visit: Payer: Self-pay | Admitting: Physical Medicine and Rehabilitation

## 2022-08-08 ENCOUNTER — Other Ambulatory Visit: Payer: Self-pay | Admitting: *Deleted

## 2022-08-08 DIAGNOSIS — M545 Low back pain, unspecified: Secondary | ICD-10-CM

## 2022-08-08 MED ORDER — ENBREL SURECLICK 50 MG/ML ~~LOC~~ SOAJ: 50.0000 mg | SUBCUTANEOUS | 0 refills | Status: DC

## 2022-08-08 NOTE — Telephone Encounter (Signed)
Next Visit: 12/17/2022  Last Visit: 07/18/2022  Last Fill: 04/30/2022  [id number] arthritis with rheumatoid factor of multiple sites without organ or systems involvement   Current Dose per office note 07/18/2022: Enbrel 50 mg sq injections once weekly   Labs: 07/03/2022 Glucose 143, Creat 1.27, eGFR 45, WBC 14.5, RBC 3.67, Neutro Abs 13,079, Eosinophils Absolute 0,   TB Gold: 01/03/2023   TB gold negative.   Okay to refill Enbrel?

## 2022-08-14 ENCOUNTER — Other Ambulatory Visit (HOSPITAL_COMMUNITY): Payer: Self-pay

## 2022-08-14 NOTE — Telephone Encounter (Addendum)
Per test claim, copay for 28 day supply of Enbrel [employer] is [id number] and for 84 day supply is $3111.  Patient states that she is considering paying the copay for 84 days supply but will likely cause some financial strain. I advised that we could try another medication called Simponi SQ  She had itchiness from Humira but SIMPONI may be an option for her as the income criteria for program is a bit higher than that for Enbrel. She verbalized understanding and would like to think more on it. She states that her disease has been well-controlled for some time and had discussed at last OV possibly coming off of Enbrel to see how she does without. However she has been advised that we will need an office visit to discuss any medication changes.  Knox Saliva, PharmD, MPH, BCPS, CPP Clinical Pharmacist (Rheumatology and Pulmonology)

## 2022-08-21 ENCOUNTER — Other Ambulatory Visit (HOSPITAL_COMMUNITY): Payer: Self-pay

## 2022-08-31 ENCOUNTER — Ambulatory Visit
Admission: RE | Admit: 2022-08-31 | Discharge: 2022-08-31 | Disposition: A | Discharge status: Home / Self Care | Payer: PPO | Source: Ambulatory Visit | Attending: Physical Medicine and Rehabilitation | Admitting: Physical Medicine and Rehabilitation

## 2022-08-31 DIAGNOSIS — M545 Low back pain, unspecified: Secondary | ICD-10-CM

## 2022-09-09 ENCOUNTER — Ambulatory Visit: Payer: PPO | Admitting: Cardiology

## 2022-09-24 ENCOUNTER — Other Ambulatory Visit: Payer: Self-pay | Admitting: Rheumatology

## 2022-09-24 DIAGNOSIS — M0579 Rheumatoid arthritis with rheumatoid factor of multiple sites without organ or systems involvement: Secondary | ICD-10-CM

## 2022-09-25 ENCOUNTER — Telehealth: Payer: Self-pay | Admitting: Rheumatology

## 2022-09-25 ENCOUNTER — Other Ambulatory Visit: Payer: Self-pay | Admitting: *Deleted

## 2022-09-25 ENCOUNTER — Encounter: Payer: Self-pay | Admitting: *Deleted

## 2022-09-25 DIAGNOSIS — Z79899 Other long term (current) drug therapy: Secondary | ICD-10-CM

## 2022-09-25 DIAGNOSIS — Z111 Encounter for screening for respiratory tuberculosis: Secondary | ICD-10-CM

## 2022-09-25 DIAGNOSIS — Z9225 Personal history of immunosupression therapy: Secondary | ICD-10-CM

## 2022-09-25 NOTE — Telephone Encounter (Signed)
Patient while in office to get labs requested a refill of Gabapentin and Arava to be sent to [facility].

## 2022-09-25 NOTE — Telephone Encounter (Signed)
Too soon for refill. Can send refill 10/18/2022.

## 2022-09-26 LAB — CBC WITH DIFFERENTIAL/PLATELET
Absolute Monocytes: 441 cells/uL (ref 200–950)
Basophils Absolute: 87 cells/uL (ref 0–200)
Basophils Relative: 1.5 %
Eosinophils Absolute: 470 cells/uL (ref 15–500)
Eosinophils Relative: 8.1 %
HCT: 32.3 % — ABNORMAL LOW (ref 35.0–45.0)
Hemoglobin: 10.8 g/dL — ABNORMAL LOW (ref 11.7–15.5)
Lymphs Abs: 1450 cells/uL (ref 850–3900)
MCH: 33.3 pg — ABNORMAL HIGH (ref 27.0–33.0)
MCHC: 33.4 g/dL (ref 32.0–36.0)
MCV: 99.7 fL (ref 80.0–100.0)
MPV: 9.8 fL (ref 7.5–12.5)
Monocytes Relative: 7.6 %
Neutro Abs: 3352 cells/uL (ref 1500–7800)
Neutrophils Relative %: 57.8 %
Platelets: 229 10*3/uL (ref 140–400)
RBC: 3.24 10*6/uL — ABNORMAL LOW (ref 3.80–5.10)
RDW: 12.2 % (ref 11.0–15.0)
Total Lymphocyte: 25 %
WBC: 5.8 10*3/uL (ref 3.8–10.8)

## 2022-09-26 LAB — COMPLETE METABOLIC PANEL WITH GFR
AG Ratio: 1.9 (calc) (ref 1.0–2.5)
ALT: 19 U/L (ref 6–29)
AST: 20 U/L (ref 10–35)
Albumin: 4 g/dL (ref 3.6–5.1)
Alkaline phosphatase (APISO): 61 U/L (ref 37–153)
BUN/Creatinine Ratio: 15 (calc) (ref 6–22)
BUN: 16 mg/dL (ref 7–25)
CO2: 23 mmol/L (ref 20–32)
Calcium: 8.9 mg/dL (ref 8.6–10.4)
Chloride: 108 mmol/L (ref 98–110)
Creat: 1.05 mg/dL — ABNORMAL HIGH (ref 0.60–1.00)
Globulin: 2.1 g/dL (calc) (ref 1.9–3.7)
Glucose, Bld: 105 mg/dL — ABNORMAL HIGH (ref 65–99)
Potassium: 3.9 mmol/L (ref 3.5–5.3)
Sodium: 141 mmol/L (ref 135–146)
Total Bilirubin: 0.8 mg/dL (ref 0.2–1.2)
Total Protein: 6.1 g/dL (ref 6.1–8.1)
eGFR: 56 mL/min/{1.73_m2} — ABNORMAL LOW (ref >=60)

## 2022-09-26 NOTE — Progress Notes (Signed)
Hemoglobin is low and stable.  Please forward results to her PCP.  GFR is low and stable.  Patient should avoid all NSAIDs.

## 2022-09-30 ENCOUNTER — Other Ambulatory Visit: Payer: Self-pay | Admitting: Rheumatology

## 2022-09-30 ENCOUNTER — Telehealth: Payer: Self-pay

## 2022-09-30 DIAGNOSIS — M0579 Rheumatoid arthritis with rheumatoid factor of multiple sites without organ or systems involvement: Secondary | ICD-10-CM

## 2022-09-30 NOTE — Telephone Encounter (Signed)
[facility] contacted the office stating they had sent in medication refill requests for Gabapentin and Leflunomide. Stated we had denied the prescription. Advised we did not get new prescription for either Gabapentin or Leflunomide. Advised the pharmacy we do not sink medication with them. Advised pharmacy they should complete the original prescriptions sent on 07/19/2022. Pharmacist verbalized understanding.

## 2022-10-18 ENCOUNTER — Ambulatory Visit: Payer: PPO | Admitting: Cardiology

## 2022-10-23 ENCOUNTER — Other Ambulatory Visit: Payer: Self-pay | Admitting: Rheumatology

## 2022-10-23 DIAGNOSIS — M0579 Rheumatoid arthritis with rheumatoid factor of multiple sites without organ or systems involvement: Secondary | ICD-10-CM

## 2022-10-23 MED ORDER — LEFLUNOMIDE 10 MG PO TABS: 10.0000 mg | ORAL_TABLET | Freq: Every day | ORAL | 0 refills | Status: DC

## 2022-10-23 MED ORDER — GABAPENTIN 300 MG PO CAPS: ORAL_CAPSULE | ORAL | 0 refills | Status: DC

## 2022-10-23 NOTE — Telephone Encounter (Signed)
Last Fill: 07/19/2022  Labs: 09/25/2022 Hemoglobin is low and stable. GFR is low and stable.   Next Visit: 12/17/2022  Last Visit: 07/18/2022  DX: Rheumatoid arthritis with rheumatoid factor of multiple sites without organ or systems involvement  Myofascial pain syndrome   Current Dose per office note 07/18/2022: arava 10 mg 1 tablet by mouth daily Gabapentin dose not discussed  Okay to refill Arava  an Gabapentin?

## 2022-10-23 NOTE — Telephone Encounter (Signed)
Patient called requesting prescription refills for Leflunomide and Gabapentin to be sent to [facility] at [facility].  Patient states she is out of medication.  Patient states these two prescriptions should NOT be sent to [facility].

## 2022-10-24 ENCOUNTER — Encounter: Payer: Self-pay | Admitting: Cardiology

## 2022-10-24 ENCOUNTER — Ambulatory Visit: Payer: PPO | Attending: Cardiology | Admitting: Cardiology

## 2022-10-24 VITALS — BP 118/68 | HR 63 | Ht 62.5 in | Wt 203.0 lb

## 2022-10-24 DIAGNOSIS — E785 Hyperlipidemia, unspecified: Secondary | ICD-10-CM | POA: Diagnosis not present

## 2022-10-24 DIAGNOSIS — I2510 Atherosclerotic heart disease of native coronary artery without angina pectoris: Secondary | ICD-10-CM | POA: Diagnosis not present

## 2022-10-24 DIAGNOSIS — I10 Essential (primary) hypertension: Secondary | ICD-10-CM

## 2022-10-24 NOTE — Progress Notes (Signed)
Cardiology Office Note:    Date:  10/24/2022   ID:  SABAH ZUCCO, DOB 1951-01-25, MRN [id number]  PCP:  [facility] Providers Cardiologist:  Donato Schultz, MD    Referring MD: [facility]    History of Present Illness:    Kari [name] is a 72 y.o. female here for follow up CAD, nonobstructive by catheterization 03/2022.  She is feeling fair. CKD is very mild. [facility]. Has not been since NOV because of back pain. Getting better. Water walking.   Mother 44's and Father 17 had MI's. Has GERD.  Trying to get out and walk more.  Trying to get her son to go with her for exercise as well.  She had a coronary CT 02/28/2022 that showed moderate CAD in mid LAD, distal LAD, mid RCA, 50-69% stenosis, CADRADS 3. Total plaque volume 643 mm3. Probable severe ostial and proximal stenosis of small caliber ramus intermedius, 70-99% stenosis, CADRADS 4. Vessel is <2 mm. Coronary calcium score is 867, which places the patient in the 95th percentile for age and sex matched control. Possible patent foramen ovale without evidence of shunting on this exam.  She ended up going for heart catheterization after symptoms returned on 03/26/2022.  Mild nonobstructive disease noted.  Previously seen by hypertension clinic.  Excellent.  Has a family history of stroke and myocardial infarction.  Echocardiogram previously reviewed shows normal ejection fraction. No evidence of mitral valve prolapse.  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  Her son enjoys anime.  She would like to go on a bullet train in [place].  She is also hopeful for a cruise around [place] and [place].  She was planning to go down to the [place].  At a previous visit she reported improvement of her palpitations after starting spironolactone.  She is compliant with Zatia and rosuvastatin 20 mg. Her LDL was 75 as of 03/2021.  She denies any  palpitations, chest pain, shortness of breath, or peripheral edema. No lightheadedness, headaches, syncope, orthopnea, or PND.   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   Rheumatoid aortitis    ? arthritis   Stage 3a chronic kidney disease (CKD)    Unstable angina    cath revealed moderate, nonobstructive CAD.  50% stenosis of mid RCA with 25% stenosis of distal LAD, EF 55 to 65%.    Past Surgical History:  Procedure Laterality Date   ABDOMINAL HYSTERECTOMY     CESAREAN SECTION     CHOLECYSTECTOMY     COLONOSCOPY     JOINT REPLACEMENT     KNEE ARTHROSCOPY Right 2/11   LEFT HEART CATH AND CORONARY ANGIOGRAPHY N/A 03/26/2022   Procedure: LEFT HEART CATH AND CORONARY ANGIOGRAPHY;  Surgeon: Corky Crafts, MD;  Location: [facility] INVASIVE CV LAB;  Service: Cardiovascular;  Laterality: N/A;  TOTAL HIP ARTHROPLASTY Left 02/03/2013   Procedure: TOTAL HIP ARTHROPLASTY;  Surgeon: Nestor Lewandowsky, MD;  Location: [facility] OR;  Service: Orthopedics;  Laterality: Left;   TOTAL HIP ARTHROPLASTY Right 09/22/2020   Dr. Vilinda Flake NERVE TRANSPOSITION  09/17/2011   Procedure: ULNAR NERVE DECOMPRESSION/TRANSPOSITION;  Surgeon: Wyn Forster., MD;  Location: [facility];  Service: Orthopedics;  Laterality: Left;  decompression   WRIST ARTHROSCOPY Right    x2-fusion     WRIST ARTHROSCOPY Left     Current Medications: Current Meds  Medication Sig   amLODipine (NORVASC) 2.5 MG tablet  Take 1 tablet (2.5 mg total) by mouth daily.   aspirin EC 81 MG tablet Take 1 tablet (81 mg total) by mouth daily. Swallow whole.   Calcium Citrate-Vitamin D (CALCIUM + D PO) Take 1 tablet by mouth daily.   cetirizine (ALLERGY, CETIRIZINE,) 10 MG tablet Take 10 mg by mouth 3 (three) times a week. Tues, Wed, and Thurs   Cholecalciferol (VITAMIN D) 50 MCG (2000 UT) tablet Take 2,000 Units by mouth daily.   clindamycin (CLEOCIN) 300 MG capsule Take 600 mg by mouth See admin instructions. Take 600 mg 1 hour prior to dental work   diclofenac Sodium (VOLTAREN) 1 % GEL Apply 1 Application topically 4 (four) times daily as needed (pain).   eletriptan (RELPAX) 20 MG tablet as needed for migraine or headache.   ezetimibe (ZETIA) 10 MG tablet Take 1 tablet (10 mg total) by mouth daily.   fluticasone (FLONASE) 50 MCG/ACT nasal spray Place 1 spray into both nostrils daily for 3 days.   gabapentin (NEURONTIN) 300 MG capsule TAKE FOUR CAPSULES BY MOUTH DAILY   ibuprofen (ADVIL) 200 MG tablet Take 200 mg by mouth every 6 (six) hours as needed for moderate pain.   isosorbide mononitrate (IMDUR) 30 MG 24 hr tablet Take 1 tablet (30 mg total) by mouth daily.   leflunomide (ARAVA) 10 MG tablet Take 1 tablet (10 mg total) by mouth daily.   levothyroxine (SYNTHROID) 75 MCG tablet Take 75 mcg by mouth every morning.   loratadine (CLARITIN) 10 MG tablet Take 10 mg by mouth 4 (four) times a week. Sat, Sun, Mon, and Fri   losartan (COZAAR) 50 MG tablet Take 1 tablet (50 mg total) by mouth daily.   Multiple Vitamin (MULTIVITAMIN WITH MINERALS) TABS Take 1 tablet by mouth daily.   nadolol (CORGARD) 40 MG tablet Take 1 tablet (40 mg total) by mouth every morning.   Omega-3 Fatty Acids (FISH OIL) 1000 MG CAPS Take by mouth.   pantoprazole (PROTONIX) 40 MG tablet Take 40 mg by mouth 2 (two) times daily.    rosuvastatin (CRESTOR) 20 MG tablet Take 1 tablet (20 mg total) by mouth daily.   spironolactone (ALDACTONE) 25 MG  tablet Take 1 tablet (25 mg total) by mouth daily.   TYRVAYA 0.03 MG/ACT SOLN Place 1 spray into both nostrils 2 (two) times daily as needed (dry eyes).     Allergies:   Humira [adalimumab], Clinoril [sulindac], Erythromycin, Hydrocodone, Ibuprofen, Penicillins, Plaquenil [hydroxychloroquine], Sulfa antibiotics, Trimethoprim, and Vancomycin cross reactors   Social History   Socioeconomic History   Marital status: Widowed    Spouse name: Not on file   Number of children: Not on file   Years of education: Not on file   Highest education level: Not on file  Occupational History   Not on file  Tobacco Use   Smoking status: Never  Passive exposure: Never   Smokeless tobacco: Never  Vaping Use   Vaping Use: Never used  Substance and Sexual Activity   Alcohol use: Yes    Comment: rarely    Drug use: No   Sexual activity: Not on file  Other Topics Concern   Not on file  Social History Narrative   Not on file   Social Determinants of Health   Financial Resource Strain: Not on file  Food Insecurity: Not on file  Transportation Needs: Not on file  Physical Activity: Not on file  Stress: Not on file  Social Connections: Not on file     Family History: The patient's family history includes Colon cancer in an other family member; Colon polyps in an other family member; Heart attack in her father; Heart disease in her mother; Hypothyroidism in her son; Stroke in her mother.  ROS:   Please see the history of present illness.    All other systems reviewed and are negative.  EKGs/Labs/Other Studies Reviewed:    The following studies were reviewed today:  Cardiac catheterization 03/26/2022 Diagnostic Dominance: Right    Coronary CT  02/28/2022: FINDINGS: Image quality: Average   Noise artifact is: Reduced signal to noise   Coronary calcium score is 867, which places the patient in the 95th percentile for age and sex matched control.   Coronary arteries: Normal  coronary origins.  Right dominance.   Right Coronary Artery: Mild atherosclerotic plaque at the ostial RCA, 25-49% stenosis. Moderate mixed atherosclerotic plaque in the mid RCA, 50-69% stenosis. Patent PDA, PLA.   Left Main Coronary Artery: Minimal ostial mixed atherosclerotic plaque, <25% stenosis. Mild mixed atherosclerotic plaque in the distal LMCA extending into the ostial LAD and LCx, 25-49% stenosis.   Left Anterior Descending Coronary Artery: Mild mixed atherosclerotic plaque in the ostial and proximal LAD, 25-49% stenosis. Moderate mixed atherosclerotic plaque in the mid and distal LAD, 50-69% stenosis. Ramus intermedius covers the territory of the first diagonal. A functionally second diagonal is patent with scattered nonobstructive plaque. Third diagonal is small and patent.   Ramus intermedius: Small caliber vessel (<2 mm) with probable severe ostial and proximal mixed atherosclerotic plaque, 70-99% stenosis.   Left Circumflex Artery: Mild mixed atherosclerotic plaque in the ostial LCx, 25-49% stenosis. Minimal atherosclerotic plaque in OM1 and OM2, <25% stenosis.   Aorta: Normal size, 33 mm at the mid ascending aorta (level of the PA bifurcation) measured double oblique. Moderate mixed plaque. No dissection.   Aortic Valve: No calcifications.   Other findings:   Normal pulmonary vein drainage into the left atrium.   Normal left atrial appendage without thrombus.   Normal size of the pulmonary artery.   Probable patent foramen ovale.   IMPRESSION: 1. Moderate CAD in mid LAD, distal LAD, mid RCA, 50-69% stenosis, CADRADS 3. Total plaque volume 643 mm3. CT FFR will be performed and reported separately.   2. Probable severe ostial and proximal stenosis of small caliber ramus intermedius, 70-99% stenosis, CADRADS 4. Vessel is <2 mm.   3. Coronary calcium score is 867, which places the patient in the 95th percentile for age and sex matched control.   4.  Normal coronary origins with right dominance.   5. Possible patent foramen ovale without evidence of shunting on this exam.   RECOMMENDATIONS: CAD-RADS 3. Moderate stenosis. Consider symptom-guided anti-ischemic pharmacotherapy as well as risk factor modification per guideline directed care. Additional analysis with CT FFR will be submitted.  CT FFR ANALYSIS  02/28/2022: FINDINGS:  FFRct analysis was performed on the original cardiac CT angiogram dataset. Diagrammatic representation of the FFRct analysis is provided in a separate PDF document in PACS. This dictation was created using the PDF document and an interactive 3D model of the results. 3D model is not available in the EMR/PACS. Normal FFR range is >0.80. Indeterminate (grey) zone is 0.76-0.80. FFR delta of 0.13 is considered significant.   1. Left Main: FFR = 0.98   2. LAD: Proximal FFR = 0.94, mid FFR = 0.89, Distal FFR = 0.66 with delta of 0.13. 3. LCX: Proximal FFR = 0.98, distal FFR = 0.94 4. RCA: Proximal FFR = 0.91, mid FFR =0.89, Distal FFR = 0.88   IMPRESSION: 1. CT FFR analysis showed high likelihood of significant stenosis in the distal LAD, FFR 0.66 with delta of 0.79-0.66 (0.13). Vessel measures 2.5 mm proximal to the stenosis.   RECOMMENDATIONS: Guideline-directed medical therapy and aggressive risk factor modification for secondary prevention of coronary artery disease. This lesion looks most amenable to medical therapy given very distal location. However if symptoms are refractory to medical therapy, consider coronary angiography given vessel size.  Echo  02/22/2022: Final results pending.  Echocardiogram 11/08/2019:  1. Left ventricular ejection fraction, by estimation, is 60 to 65%. The  left ventricle has normal function. The left ventricle has no regional  wall motion abnormalities. There is moderate left ventricular hypertrophy  of the basal-septal segment. Left  ventricular diastolic parameters  are consistent with Grade I diastolic  dysfunction (impaired relaxation). Elevated left ventricular end-diastolic  pressure.   2. Right ventricular systolic function is normal. The right ventricular  size is normal.   3. No evidence of mitral valve prolapse. The mitral valve is normal in  structure. No evidence of mitral valve regurgitation. No evidence of  mitral stenosis.   4. The aortic valve is tricuspid. Aortic valve regurgitation is not  visualized. Mild aortic valve sclerosis is present, with no evidence of  aortic valve stenosis.   5. The inferior vena cava is normal in size with greater than 50%  respiratory variability, suggesting right atrial pressure of 3 mmHg.    EKG:  EKG is personally reviewed. 03/01/2022:  EKG was not ordered. 02/08/2022:  Sinus bradycardia. Rate 57 bpm. 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 LAFB. Nonspecific ST changes. 01/12/2021: sinus rhythm 60 left anterior fascicular block poor R wave progression  Recent Labs: 09/25/2022: ALT 19; BUN 16; Creat 1.05; Hemoglobin 10.8; Platelets 229; Potassium 3.9; Sodium 141   Recent Lipid Panel No results found for: "CHOL", "TRIG", "HDL", "CHOLHDL", "VLDL", "LDLCALC", "LDLDIRECT"   Risk Assessment/Calculations:          Physical Exam:    VS:  BP 118/68   Pulse 63   Ht 5' 2.5" (1.588 m)   Wt 203 lb (92.1 kg)   SpO2 95%   BMI 36.54 kg/m     Wt Readings from Last 3 Encounters:  10/24/22 203 lb (92.1 kg)  07/18/22 199 lb (90.3 kg)  04/11/22 200 lb (90.7 kg)     GEN: Well nourished, well developed, in no acute distress HEENT: normal Neck: no JVD, carotid bruits, or masses Cardiac: RRR; no murmurs, rubs, or gallops,no edema  Respiratory:  clear to auscultation bilaterally, normal work of breathing GI: soft, nontender, nondistended, + BS MS: no deformity or atrophy Skin: warm and dry, no rash Neuro:  Alert and Oriented x 3, Strength and sensation are intact Psych: euthymic mood, full affect   ASSESSMENT:  1. Coronary artery disease involving native heart without angina pectoris, unspecified vessel or lesion type   2. Hyperlipidemia, unspecified hyperlipidemia type   3. Hypertension, unspecified type       PLAN:    In order of problems listed above:  Coronary artery disease -Cardiac catheterization reviewed as above, mild nonobstructive disease.  Prior coronary CT reviewed as well.  Continue with aggressive secondary risk factor prevention. EF normal.  She had severe vasospasm catheters difficult.  Use femoral approach in the future.  Medical therapy once again recommended. - Zetia 10 mg to her Crestor 20 mg a day.   Goal LDL is less than 70. -aspirin 81 mg.  She is on Protonix. - isosorbide 30 mg a day.  Watch for headache.  Shortness of breath -Echocardiogram reassuring.  Read from [facility] did not transmit to epic.  Reviewed on workstation.  Normal EF.  Deconditioning certainly could be playing a role.    Palpitations - Has had what seemed like PVCs by history.   She is also on Corgard or nadolol 40 mg every morning, beta-blocker.   No change in her medical management.  Refill beta-blocker as needed. Stable  Rheumatoid arthritis - On leflunomide.  Rheumatoid modulator.  Excellent.  Per primary team.  Monitored by rheumatology.  Right hip replaced March 2022  Essential hypertension - Overall well controlled with losartan 100 mg a day nadolol 40 mg a day, spironolactone 25 mg a day.  Refills as needed for this medical management.  No changes made today.  Even saw a kidney specialist, off of NSAIDs.  Prior echocardiogram showed no evidence of mitral valve prolapse.  Very soft murmur noted on exam.  Hyperlipidemia - Continue with Crestor 20 mg.  Started Zetia 10.  No myalgias.  Follow-up: 1 year follow-up  Medication Adjustments/Labs and Tests Ordered: Current medicines are reviewed at length with the patient today.  Concerns regarding medicines are outlined above.   No orders  of the defined types were placed in this encounter.  No orders of the defined types were placed in this encounter.  Patient Instructions  Medication Instructions:  The current medical regimen is effective;  continue present plan and medications.  *If you need a refill on your cardiac medications before your next appointment, please call your pharmacy*  Follow-Up: At [facility], you and your health needs are our priority.  As part of our continuing mission to provide you with exceptional heart care, we have created designated Provider Care Teams.  These Care Teams include your primary Cardiologist (physician) and Advanced Practice Providers (APPs -  Physician Assistants and Nurse Practitioners) who all work together to provide you with the care you need, when you need it.  We recommend signing up for the patient portal called "MyChart".  Sign up information is provided on this After Visit Summary.  MyChart is used to connect with patients for Virtual Visits (Telemedicine).  Patients are able to view lab/test results, encounter notes, upcoming appointments, etc.  Non-urgent messages can be sent to your provider as well.   To learn more about what you can do with MyChart, go to [email or web address].    Your next appointment:   1 year(s)  Provider:   Donato Schultz, MD        Signed, Donato Schultz, MD  10/24/2022 10:15 AM    [facility]

## 2022-10-24 NOTE — Patient Instructions (Signed)
Medication Instructions:  The current medical regimen is effective;  continue present plan and medications.  *If you need a refill on your cardiac medications before your next appointment, please call your pharmacy*  Follow-Up: At [facility], you and your health needs are our priority.  As part of our continuing mission to provide you with exceptional heart care, we have created designated Provider Care Teams.  These Care Teams include your primary Cardiologist (physician) and Advanced Practice Providers (APPs -  Physician Assistants and Nurse Practitioners) who all work together to provide you with the care you need, when you need it.  We recommend signing up for the patient portal called "MyChart".  Sign up information is provided on this After Visit Summary.  MyChart is used to connect with patients for Virtual Visits (Telemedicine).  Patients are able to view lab/test results, encounter notes, upcoming appointments, etc.  Non-urgent messages can be sent to your provider as well.   To learn more about what you can do with MyChart, go to [email or web address].    Your next appointment:   1 year(s)  Provider:   Mark Skains, MD      

## 2022-10-28 ENCOUNTER — Other Ambulatory Visit: Payer: Self-pay | Admitting: Rheumatology

## 2022-10-28 DIAGNOSIS — M0579 Rheumatoid arthritis with rheumatoid factor of multiple sites without organ or systems involvement: Secondary | ICD-10-CM

## 2022-12-03 NOTE — Progress Notes (Deleted)
Office Visit Note  Patient: Kari [name]             Date of Birth: 20-Sep-1950           MRN: [id number]             PCP: [facility] [place] Referring: [facility] Visit Date: 12/17/2022 Occupation: @GUAROCC @  Subjective:  No chief complaint on file.   History of Present Illness: Kari [name] is a 72 y.o. female ***     DEXA 02/21/20: Right total hip BMD 0.703 with T-score -2.0. previously treated with fosamax and forteo (completed 2 years of treatment). Reclast IV on 06/18/2021.February 21, 2022 DEXA scan T-score -1.8, BMD 0.586 left one third radius which showed -6% change from August 2021.  AP total spine T-score +0.7, 5% improvement. DEXA results were discussed with the patient at length.  We will hold off Reclast until the next DEXA scan in 2025.  Calcium rich diet, vitamin D was advised.   Activities of Daily Living:  Patient reports morning stiffness for *** {minute/hour:19697}.   Patient {ACTIONS;DENIES/REPORTS:21021675::"Denies"} nocturnal pain.  Difficulty dressing/grooming: {ACTIONS;DENIES/REPORTS:21021675::"Denies"} Difficulty climbing stairs: {ACTIONS;DENIES/REPORTS:21021675::"Denies"} Difficulty getting out of chair: {ACTIONS;DENIES/REPORTS:21021675::"Denies"} Difficulty using hands for taps, buttons, cutlery, and/or writing: {ACTIONS;DENIES/REPORTS:21021675::"Denies"}  No Rheumatology ROS completed.   PMFS History:  Patient Active Problem List   Diagnosis Date Noted   Moderate, nonobstructive CAD (coronary artery disease) 04/11/2022   Dizziness with bending over 04/11/2022   Stage 3a chronic kidney disease (CKD) (HCC) 04/11/2022   Anemia 04/11/2022   Class 2 obesity 04/11/2022   Precordial chest pain    Rheumatoid arthritis with rheumatoid factor of multiple sites without organ or systems involvement (HCC) 10/17/2020   High risk medication use 10/17/2020   History of total replacement of both hip joints 10/17/2020   DDD  (degenerative disc disease), lumbar 10/17/2020   Myofascial pain syndrome 10/17/2020   Age-related osteoporosis without current pathological fracture 10/17/2020   Frequent falls 10/17/2020   Hyperlipidemia 07/14/2015   Palpitation 05/11/2014   PVC (premature ventricular contraction) 05/11/2014   Hypokalemia 05/11/2014   Essential hypertension 05/11/2014   Mitral regurgitation 05/11/2014   Arthritis, hip 02/03/2013    Past Medical History:  Diagnosis Date   Anemia    Arthritis    Class 2 obesity    Complication of anesthesia    when block attempted ax -turned red in preop-surg delayed   Dizziness    Fibromyalgia    GERD (gastroesophageal reflux disease)    HLD (hyperlipidemia)    Hypertension    Hypothyroidism    Mild to moderate, nonobstructive CAD (coronary artery disease)    MVP (mitral valve prolapse)    Osteoporosis    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   Rheumatoid aortitis    ? arthritis   Stage 3a chronic kidney disease (CKD) (HCC)    Unstable angina (HCC)    cath revealed moderate, nonobstructive CAD.  50% stenosis of mid RCA with 25% stenosis of distal LAD, EF 55 to 65%.    Family History  Problem Relation Age of Onset   Heart attack Father    Heart disease Mother    Stroke Mother    Colon cancer Other    Colon polyps Other    Hypothyroidism Son    Past Surgical History:  Procedure Laterality Date   ABDOMINAL HYSTERECTOMY     CESAREAN  SECTION     CHOLECYSTECTOMY     COLONOSCOPY     JOINT REPLACEMENT     KNEE ARTHROSCOPY Right 2/11   LEFT HEART CATH AND CORONARY ANGIOGRAPHY N/A 03/26/2022   Procedure: LEFT HEART CATH AND CORONARY ANGIOGRAPHY;  Surgeon: Corky Crafts, MD;  Location: [facility] INVASIVE CV LAB;  Service: Cardiovascular;  Laterality: N/A;   TOTAL HIP ARTHROPLASTY Left 02/03/2013   Procedure: TOTAL HIP ARTHROPLASTY;  Surgeon: Nestor Lewandowsky, MD;   Location: [facility] OR;  Service: Orthopedics;  Laterality: Left;   TOTAL HIP ARTHROPLASTY Right 09/22/2020   Dr. Vilinda Flake NERVE TRANSPOSITION  09/17/2011   Procedure: ULNAR NERVE DECOMPRESSION/TRANSPOSITION;  Surgeon: Wyn Forster., MD;  Location:  SURGERY CENTER;  Service: Orthopedics;  Laterality: Left;  decompression   WRIST ARTHROSCOPY Right    x2-fusion     WRIST ARTHROSCOPY Left    Social History   Social History Narrative   Not on file   Immunization History  Administered Date(s) Administered   PFIZER(Purple Top)SARS-COV-2 Vaccination 08/05/2019, 08/26/2019, 02/14/2020, 01/16/2021     Objective: Vital Signs: There were no vitals taken for this visit.   Physical Exam   Musculoskeletal Exam: ***  CDAI Exam: CDAI Score: -- Patient Global: --; Provider Global: -- Swollen: --; Tender: -- Joint Exam 12/17/2022   No joint exam has been documented for this visit   There is currently no information documented on the homunculus. Go to the Rheumatology activity and complete the homunculus joint exam.  Investigation: No additional findings.  Imaging: No results found.  Recent Labs: Lab Results  Component Value Date   WBC 5.8 09/25/2022   HGB 10.8 (L) 09/25/2022   PLT 229 09/25/2022   NA 141 09/25/2022   K 3.9 09/25/2022   CL 108 09/25/2022   CO2 23 09/25/2022   GLUCOSE 105 (H) 09/25/2022   BUN 16 09/25/2022   CREATININE 1.05 (H) 09/25/2022   BILITOT 0.8 09/25/2022   ALKPHOS 72 01/20/2017   AST 20 09/25/2022   ALT 19 09/25/2022   PROT 6.1 09/25/2022   ALBUMIN 4.2 01/20/2017   CALCIUM 8.9 09/25/2022   GFRAA 72 12/04/2020   QFTBGOLDPLUS NEGATIVE 01/02/2022    Speciality Comments: Recieves Enbrel through [employer] Program  Procedures:  No procedures performed Allergies: Humira [adalimumab], Clinoril [sulindac], Erythromycin, Hydrocodone, Ibuprofen, Penicillins, Plaquenil [hydroxychloroquine], Sulfa antibiotics, Trimethoprim, and  Vancomycin cross reactors   Assessment / Plan:     Visit Diagnoses: Rheumatoid arthritis with rheumatoid factor of multiple sites without organ or systems involvement (HCC)  High risk medication use  Medial epicondylitis of both elbows  History of total replacement of both hip joints  DDD (degenerative disc disease), lumbar  Myofascial pain syndrome  Age-related osteoporosis without current pathological fracture  Vitamin D deficiency  History of hypertension  History of hyperlipidemia  History of hypothyroidism  History of neuropathy  Orders: No orders of the defined types were placed in this encounter.  No orders of the defined types were placed in this encounter.   Face-to-face time spent with patient was *** minutes. Greater than 50% of time was spent in counseling and coordination of care.  Follow-Up Instructions: No follow-ups on file.   Gearldine Bienenstock, PA-C  Note - This record has been created using Dragon software.  Chart creation errors have been sought, but may not always  have been located. Such creation errors do not reflect on  the standard of medical care.

## 2022-12-10 NOTE — Progress Notes (Unsigned)
Office Visit Note  Patient: Kari [name]             Date of Birth: 02-03-1951           MRN: [id number]             PCP: [facility] [place] Referring: [facility] Visit Date: 12/11/2022 Occupation: @GUAROCC @  Subjective:  Left hand and right foot pain   History of Present Illness: Kari [name] is a 72 y.o. female with history of rheumatoid arthritis, osteoarthritis, and osteoporosis.  Patient remains on arava 10 mg 1 tablet by mouth daily.  Patient has been off of Enbrel since February 2024 due to issues with insurance coverage.  She no longer qualifies for patient assistance due to the income threshold.  Patient states that at the end of May she had a flare involving the left hand.  She states that her hand "froze" and felt like "somebody was pounding nails into it."  She states that the pain and inflammation in her hand has slowly started to calm down but she has had persistent pain and intermittent inflammation in the left wrist joint which has been exacerbated by physical activity.  She states that the most severe pain she has been experiencing has been in her right foot.  She states that she previously fractured the base of the right fifth metatarsal which is where her pain has been most severe.  She has noticed warmth and redness at the site.  She states that her pain is worse with activity.  She has been having to ice her foot even after short activities.  She has tried IcyHot and Voltaren gel with no relief.  She requested a prednisone taper to be sent to the pharmacy today. She denies any new medical conditions.  She denies any recent or recurrent infections.    Activities of Daily Living:  Patient reports morning stiffness for a few minutes.   Patient Denies nocturnal pain.  Difficulty dressing/grooming: Denies Difficulty climbing stairs: Reports Difficulty getting out of chair: Reports Difficulty using hands for taps, buttons, cutlery,  and/or writing: Reports  Review of Systems  Constitutional:  Positive for fatigue.  HENT:  Negative for mouth sores and mouth dryness.   Eyes:  Positive for dryness.  Respiratory:  Negative for shortness of breath.   Cardiovascular:  Negative for chest pain and palpitations.  Gastrointestinal:  Negative for blood in stool, constipation and diarrhea.  Endocrine: Negative for increased urination.  Genitourinary:  Negative for involuntary urination.  Musculoskeletal:  Positive for joint pain, gait problem, joint pain, joint swelling and morning stiffness. Negative for myalgias, muscle weakness, muscle tenderness and myalgias.  Skin:  Positive for sensitivity to sunlight. Negative for color change, rash and hair loss.  Allergic/Immunologic: Negative for susceptible to infections.  Neurological:  Negative for dizziness and headaches.  Hematological:  Negative for swollen glands.  Psychiatric/Behavioral:  Negative for depressed mood and sleep disturbance. The patient is not nervous/anxious.     PMFS History:  Patient Active Problem List   Diagnosis Date Noted   Moderate, nonobstructive CAD (coronary artery disease) 04/11/2022   Dizziness with bending over 04/11/2022   Stage 3a chronic kidney disease (CKD) (HCC) 04/11/2022   Anemia 04/11/2022   Class 2 obesity 04/11/2022   Precordial chest pain    Rheumatoid arthritis with rheumatoid factor of multiple sites without organ or systems involvement (HCC) 10/17/2020   High risk medication use 10/17/2020   History  of total replacement of both hip joints 10/17/2020   DDD (degenerative disc disease), lumbar 10/17/2020   Myofascial pain syndrome 10/17/2020   Age-related osteoporosis without current pathological fracture 10/17/2020   Frequent falls 10/17/2020   Hyperlipidemia 07/14/2015   Palpitation 05/11/2014   PVC (premature ventricular contraction) 05/11/2014   Hypokalemia 05/11/2014   Essential hypertension 05/11/2014   Mitral  regurgitation 05/11/2014   Arthritis, hip 02/03/2013    Past Medical History:  Diagnosis Date   Anemia    Arthritis    Class 2 obesity    Complication of anesthesia    when block attempted ax -turned red in preop-surg delayed   Dizziness    Fibromyalgia    GERD (gastroesophageal reflux disease)    HLD (hyperlipidemia)    Hypertension    Hypothyroidism    Mild to moderate, nonobstructive CAD (coronary artery disease)    MVP (mitral valve prolapse)    Osteoporosis    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   Rheumatoid aortitis    ? arthritis   Stage 3a chronic kidney disease (CKD) (HCC)    Unstable angina (HCC)    cath revealed moderate, nonobstructive CAD.  50% stenosis of mid RCA with 25% stenosis of distal LAD, EF 55 to 65%.    Family History  Problem Relation Age of Onset   Heart attack Father    Heart disease Mother    Stroke Mother    Colon cancer Other    Colon polyps Other    Hypothyroidism Son    Past Surgical History:  Procedure Laterality Date   ABDOMINAL HYSTERECTOMY     CESAREAN SECTION     CHOLECYSTECTOMY     COLONOSCOPY     JOINT REPLACEMENT     KNEE ARTHROSCOPY Right 2/11   LEFT HEART CATH AND CORONARY ANGIOGRAPHY N/A 03/26/2022   Procedure: LEFT HEART CATH AND CORONARY ANGIOGRAPHY;  Surgeon: Corky Crafts, MD;  Location: [facility] INVASIVE CV LAB;  Service: Cardiovascular;  Laterality: N/A;   TOTAL HIP ARTHROPLASTY Left 02/03/2013   Procedure: TOTAL HIP ARTHROPLASTY;  Surgeon: Nestor Lewandowsky, MD;  Location: [facility] OR;  Service: Orthopedics;  Laterality: Left;   TOTAL HIP ARTHROPLASTY Right 09/22/2020   Dr. Vilinda Flake NERVE TRANSPOSITION  09/17/2011   Procedure: ULNAR NERVE DECOMPRESSION/TRANSPOSITION;  Surgeon: Wyn Forster., MD;  Location: [facility];  Service: Orthopedics;  Laterality: Left;  decompression   WRIST ARTHROSCOPY Right     x2-fusion     WRIST ARTHROSCOPY Left    Social History   Social History Narrative   Not on file   Immunization History  Administered Date(s) Administered   PFIZER(Purple Top)SARS-COV-2 Vaccination 08/05/2019, 08/26/2019, 02/14/2020, 01/16/2021     Objective: Vital Signs: BP 127/80 (BP Location: Left Arm, Patient Position: Sitting, Cuff Size: Normal)   Pulse 60   Resp 15   Ht 5' 2.5" (1.588 m)   Wt 200 lb 3.2 oz (90.8 kg)   BMI 36.03 kg/m    Physical Exam Vitals and nursing note reviewed.  Constitutional:      Appearance: She is well-developed.  HENT:     Head: Normocephalic and atraumatic.  Eyes:     Conjunctiva/sclera: Conjunctivae normal.  Cardiovascular:     Rate and Rhythm: Normal rate and regular rhythm.     Heart sounds: Normal heart sounds.  Pulmonary:     Effort: Pulmonary effort is normal.  Breath sounds: Normal breath sounds.  Abdominal:     General: Bowel sounds are normal.     Palpations: Abdomen is soft.  Musculoskeletal:     Cervical back: Normal range of motion.  Lymphadenopathy:     Cervical: No cervical adenopathy.  Skin:    General: Skin is warm and dry.     Capillary Refill: Capillary refill takes less than 2 seconds.  Neurological:     Mental Status: She is alert and oriented to person, place, and time.  Psychiatric:        Behavior: Behavior normal.      Musculoskeletal Exam: C-spine, thoracic spine, lumbar spine have good range of motion.  Shoulder joints have good range of motion.  Elbow joints have good range of motion.  Limited range of motion of both wrist joints.  Tenderness and synovitis in the left wrist noted.  Tenderness over the left third MCP joint.  Hip replacements have good range of motion with no groin pain.  Painful range of motion of her right knee.  Ankle joints have good range of motion with no joint tenderness or synovitis.  Tenderness and warmth noted at the base of the right fifth metatarsal.  No tenderness or  synovitis over MTP joints.  CDAI Exam: CDAI Score: 3  Patient Global: 5 mm; Provider Global: 5 mm Swollen: 1 ; Tender: 1  Joint Exam 12/11/2022      Right  Left  Wrist     Swollen Tender     Investigation: No additional findings.  Imaging: No results found.  Recent Labs: Lab Results  Component Value Date   WBC 5.8 09/25/2022   HGB 10.8 (L) 09/25/2022   PLT 229 09/25/2022   NA 141 09/25/2022   K 3.9 09/25/2022   CL 108 09/25/2022   CO2 23 09/25/2022   GLUCOSE 105 (H) 09/25/2022   BUN 16 09/25/2022   CREATININE 1.05 (H) 09/25/2022   BILITOT 0.8 09/25/2022   ALKPHOS 72 01/20/2017   AST 20 09/25/2022   ALT 19 09/25/2022   PROT 6.1 09/25/2022   ALBUMIN 4.2 01/20/2017   CALCIUM 8.9 09/25/2022   GFRAA 72 12/04/2020   QFTBGOLDPLUS NEGATIVE 01/02/2022    Speciality Comments: Recieves Enbrel through [employer] Program  Procedures:  No procedures performed Allergies: Humira [adalimumab], Clinoril [sulindac], Erythromycin, Hydrocodone, Ibuprofen, Penicillins, Plaquenil [hydroxychloroquine], Sulfa antibiotics, Trimethoprim, and Vancomycin cross reactors     Assessment / Plan:     Visit Diagnoses: Rheumatoid arthritis with rheumatoid factor of multiple sites without organ or systems involvement (HCC) - Patient presents today with tenderness and synovitis of the left wrist--started about 2 weeks ago.  She is also experiencing tenderness and warmth over the base of the right 5th metatarsal.  She has tried Voltaren gel, IcyHot, and ice topically with minimal to no improvement in her symptoms.  Her left wrist and right foot pain have been interfering with her quality of life as well as performing ADLs.  She is currently on Arava 10 mg 1 tablet by mouth daily as monotherapy.  She has been off of Enbrel since February 2024 due to no longer being eligible for patient assistance.  Plan to apply for Simponi subcutaneous injections through her insurance.  If she is eligible for  patient assistance she will be scheduled to administer the first dose of Simponi in the office.  Indications, contraindications, and potential side effects of Simponi were discussed today in detail.  All questions were addressed and consent was  obtained.   X-rays of both hands and both feet will be updated today to assess for radiographic progression.  A prednisone taper starting at 20 mg tapering by 5 mg every 4 days will be sent to the pharmacy to treat her current flare. She will follow up in the office in 8 weeks or sooner if needed assess her response. Plan: XR Hand 2 View Right, XR Hand 2 View Left, XR Foot 2 Views Left, XR Foot 2 Views Right  Medication counseling:   Baseline Immunosuppressant Therapy Labs TB GOLD    Latest Ref Rng & Units 01/02/2022    1:28 PM  Quantiferon TB Gold  Quantiferon TB Gold Plus NEGATIVE NEGATIVE    SPEP    Latest Ref Rng & Units 09/25/2022    2:26 PM  Serum Protein Electrophoresis  Total Protein 6.1 - 8.1 g/dL 6.1    Chest x-ray: No active disease on 01/26/13   Does patient have diagnosis of heart failure?  No  Counseled patient that Simponi is a TNF blocking agent.  Reviewed Simponi dose of 50 mg once a month.  Counseled patient on purpose, proper use, and adverse effects of Simponi.  Reviewed the most common adverse effects including infections and injection site reactions.  Discussed that there is the possibility of an increased risk of malignancy but it is not well understood if this increased risk is due to the medication or the disease state.  Advised patient to get yearly dermatology exams due to risk of skin cancer.    Counseled patient that Simponi should be held for infections and prior to scheduled surgery.  Counseled patient to avoid live vaccines while on Simponi.  Advised patient to get annual influenza vaccine and the pneumococcal vaccine as indicated.    Reviewed the importance of regular labs while on Simponi therapy. Standing orders placed.  Provided patient with medication education material and answered all questions.  Patient voiced understanding.  Patient consented to Simponi.  Will upload consent into the media tab.  Reviewed storage instructions for Simponi.  Advised initial injection must be administered in office.  Patient verbalized understanding.  Will apply for Simponi through patient's insurance and will update when we receive a response.  Patient dose will be Simponi 50 mg every 28 days.  Prescription pending lab results and/or insurance approval.  High risk medication use -Plan to apply for Simponi 50 mg subcutaneous injections once monthly. She will remain on Arava 10 mg 1 tablet by mouth daily.  Previously discontinued Enbrel due to no longer qualifying for patient assistance. TB gold negative on 01/03/23.  Order for TB gold released today. CBC and CMP 09/25/22.  Orders for CBC and CMP released today.  Her next lab work will be due in 1 month and every 3 months to monitor for drug toxicity.  Standing orders for CBC and CMP remain in place. The following baseline immunosuppressive labs will be updated today prior to initiating Simponi. Patient sees dermatology for yearly skin cancer screening. No recent or recurrent infections.  Discussed the importance of holding Simponi and Arava if she develops signs or symptoms of an infection and to resume once the infection is completely cleared. - Plan: CBC with Differential/Platelet, COMPLETE METABOLIC PANEL WITH GFR, QuantiFERON-TB Gold Plus  Screening for tuberculosis - Order for TB gold released today. Plan: QuantiFERON-TB Gold Plus  Medial epicondylitis of both elbows: Not currently symptomatic.  History of total replacement of both hip joints: Doing well.  Good range of motion  of both hip joints with no groin pain.  DDD (degenerative disc disease), lumbar: Not currently symptomatic.  No midline spinal tenderness.  Myofascial pain syndrome: She experiences intermittent  myalgias and muscle tenderness consistent with myofascial pain.  She remains on gabapentin 300 mg 4 capsules by mouth daily.  Age-related osteoporosis without current pathological fracture: Previous DEXA 02/21/20: Right total hip BMD 0.703 with T-score -2.0. Previously treated with fosamax and forteo (completed 2 years of treatment). Reclast IV on 06/18/2021. Updated DEXA: February 21, 2022 T-score -1.8, BMD 0.586 left one third radius which showed -6% change from August 2021.  AP total spine T-score +0.7, 5% improvement. DEXA results were discussed with the patient at length. Hold off Reclast until the next DEXA scan in 2025 per Dr.Deveshwar's recommendations.   Patient is taking a calcium and vitamin D supplement daily.   Vitamin D deficiency: She is taking a daily calcium and vitamin D supplement.  Other medical conditions are listed as follows:  History of hypertension: Blood pressure was 127/80 today in the office.  History of hyperlipidemia  History of hypothyroidism  History of neuropathy    Orders: Orders Placed This Encounter  Procedures   XR Hand 2 View Right   XR Hand 2 View Left   XR Foot 2 Views Left   XR Foot 2 Views Right   CBC with Differential/Platelet   COMPLETE METABOLIC PANEL WITH GFR   QuantiFERON-TB Gold Plus   Serum protein electrophoresis with reflex   IgG, IgA, IgM   Hepatitis B core antibody, IgM   Hepatitis B surface antigen   Hepatitis C antibody   HIV Antibody (routine testing w rflx)   Meds ordered this encounter  Medications   predniSONE (DELTASONE) 5 MG tablet    Sig: Take 4 tablets by mouth daily x4 days, 3 tablets daily x4 days, 2 tablets daily x4 days, 1 tablet daily x4 days.    Dispense:  40 tablet    Refill:  0    Follow-Up Instructions: Return in about 8 weeks (around 02/05/2023) for Rheumatoid arthritis, Osteoarthritis, Osteoporosis.   Gearldine Bienenstock, PA-C  Note - This record has been created using Dragon software.  Chart creation  errors have been sought, but may not always  have been located. Such creation errors do not reflect on  the standard of medical care.,

## 2022-12-11 ENCOUNTER — Ambulatory Visit (INDEPENDENT_AMBULATORY_CARE_PROVIDER_SITE_OTHER): Payer: PPO

## 2022-12-11 ENCOUNTER — Ambulatory Visit: Payer: PPO

## 2022-12-11 ENCOUNTER — Encounter: Payer: Self-pay | Admitting: Physician Assistant

## 2022-12-11 ENCOUNTER — Ambulatory Visit: Payer: PPO | Attending: Physician Assistant | Admitting: Physician Assistant

## 2022-12-11 VITALS — BP 127/80 | HR 60 | Resp 15 | Ht 62.5 in | Wt 200.2 lb

## 2022-12-11 DIAGNOSIS — Z96643 Presence of artificial hip joint, bilateral: Secondary | ICD-10-CM | POA: Diagnosis not present

## 2022-12-11 DIAGNOSIS — M79671 Pain in right foot: Secondary | ICD-10-CM

## 2022-12-11 DIAGNOSIS — Z79899 Other long term (current) drug therapy: Secondary | ICD-10-CM | POA: Diagnosis not present

## 2022-12-11 DIAGNOSIS — Z114 Encounter for screening for human immunodeficiency virus [HIV]: Secondary | ICD-10-CM

## 2022-12-11 DIAGNOSIS — M7918 Myalgia, other site: Secondary | ICD-10-CM

## 2022-12-11 DIAGNOSIS — M79642 Pain in left hand: Secondary | ICD-10-CM | POA: Diagnosis not present

## 2022-12-11 DIAGNOSIS — M5136 Other intervertebral disc degeneration, lumbar region: Secondary | ICD-10-CM

## 2022-12-11 DIAGNOSIS — M7701 Medial epicondylitis, right elbow: Secondary | ICD-10-CM

## 2022-12-11 DIAGNOSIS — M0579 Rheumatoid arthritis with rheumatoid factor of multiple sites without organ or systems involvement: Secondary | ICD-10-CM

## 2022-12-11 DIAGNOSIS — M810 Age-related osteoporosis without current pathological fracture: Secondary | ICD-10-CM

## 2022-12-11 DIAGNOSIS — M79641 Pain in right hand: Secondary | ICD-10-CM | POA: Diagnosis not present

## 2022-12-11 DIAGNOSIS — Z8669 Personal history of other diseases of the nervous system and sense organs: Secondary | ICD-10-CM

## 2022-12-11 DIAGNOSIS — M79672 Pain in left foot: Secondary | ICD-10-CM

## 2022-12-11 DIAGNOSIS — M7702 Medial epicondylitis, left elbow: Secondary | ICD-10-CM

## 2022-12-11 DIAGNOSIS — Z111 Encounter for screening for respiratory tuberculosis: Secondary | ICD-10-CM

## 2022-12-11 DIAGNOSIS — Z8679 Personal history of other diseases of the circulatory system: Secondary | ICD-10-CM

## 2022-12-11 DIAGNOSIS — E559 Vitamin D deficiency, unspecified: Secondary | ICD-10-CM

## 2022-12-11 DIAGNOSIS — Z8639 Personal history of other endocrine, nutritional and metabolic disease: Secondary | ICD-10-CM

## 2022-12-11 MED ORDER — PREDNISONE 5 MG PO TABS
ORAL_TABLET | ORAL | 0 refills | Status: DC
Start: 2022-12-11 — End: 2023-01-27

## 2022-12-11 NOTE — Patient Instructions (Signed)
Standing Labs We placed an order today for your standing lab work.   Please have your standing labs drawn in 1 month and then every 3 months   Please have your labs drawn 2 weeks prior to your appointment so that the provider can discuss your lab results at your appointment, if possible.  Please note that you may see your imaging and lab results in MyChart before we have reviewed them. We will contact you once all results are reviewed. Please allow our office up to 72 hours to thoroughly review all of the results before contacting the office for clarification of your results.  WALK-IN LAB HOURS  Monday through Thursday from 8:00 am -12:30 pm and 1:00 pm-5:00 pm and Friday from 8:00 am-12:00 pm.  Patients with office visits requiring labs will be seen before walk-in labs.  You may encounter longer than normal wait times. Please allow additional time. Wait times may be shorter on  Monday and Thursday afternoons.  We do not book appointments for walk-in labs. We appreciate your patience and understanding with our staff.   Labs are drawn by [facility]. Please bring your co-pay at the time of your lab draw.  You may receive a bill from [facility] for your lab work.  Please note if you are on Hydroxychloroquine and and an order has been placed for a Hydroxychloroquine level,  you will need to have it drawn 4 hours or more after your last dose.  If you wish to have your labs drawn at another location, please call the office 24 hours in advance so we can fax the orders.  The office is located at [address], [place], [place] [address]   If you have any questions regarding directions or hours of operation,  please call [phone].   As a reminder, please drink plenty of water prior to coming for your lab work. Thanks!   Golimumab Injection What is this medication? GOLIMUMAB (goe LIM ue mab) treats autoimmune conditions, such as arthritis and ulcerative colitis. It works by slowing down  an overactive immune system. It belongs to a group of medications called TNF inhibitors. It is a monoclonal antibody. This medicine may be used for other purposes; ask your health care provider or pharmacist if you have questions. COMMON BRAND NAME(S): Simponi, SIMPONI ARIA What should I tell my care team before I take this medication? They need to know if you have any of these conditions: Cancer Diabetes Guillain-Barre syndrome Heart failure Hepatitis B or history of hepatitis B infection Immune system problems Infection or history of infections Low blood counts, such as low white cell, platelet, or red cell counts Multiple sclerosis Psoriasis Recently received or scheduled to receive a vaccine Tuberculosis, a positive skin test for tuberculosis, or recent close contact with someone who has tuberculosis An unusual or allergic reaction to golimumab, other medications, latex, rubber, foods, dyes, or preservatives Pregnant or trying to get pregnant Breast-feeding How should I use this medication? This medication is injected into a vein or under the skin. It is usually given by your care team in a hospital or clinic setting. It may also be given at home. If you get this medication at home, you will be taught how to prepare and give it. Use exactly as directed. Take it as directed on the prescription label at the same time every day. Keep taking it unless your care team tells you to stop. It is important that you put your used injectors, needles, and syringes  in a special sharps container. Do not put them in a trash can. If you do not have a sharps container, call your pharmacist or care team to get one. A special MedGuide will be given to you by the pharmacist with each prescription and refill. Be sure to read this information carefully each time. If you get this medication in a hospital or clinic setting, a special MedGuide will be given to you before each treatment. Be sure to read this  information carefully each time. Talk to your care team about the use of this medication in children. While it may be prescribed for children as young as 2 years for selected conditions, precautions do apply. Overdosage: If you think you have taken too much of this medicine contact a poison control center or emergency room at once. NOTE: This medicine is only for you. Do not share this medicine with others. What if I miss a dose? If you get this medication at the hospital or clinic: It is important not to miss your dose. Call your care team if you are unable to keep an appointment. If you give yourself this medication at home: If you miss a dose, take it as soon as you can. If it is almost time for your next dose, take only that dose. Do not take double or extra doses. Call your care team with questions. What may interact with this medication? Do not take this medication with any of the following: Abatacept Adalimumab Anakinra Certolizumab Etanercept Infliximab Live virus vaccines Rilonacept Rituximab Tocilizumab This medication may also interact with the following: Cyclosporine Theophylline Vaccines Warfarin This list may not describe all possible interactions. Give your health care provider a list of all the medicines, herbs, non-prescription drugs, or dietary supplements you use. Also tell them if you smoke, drink alcohol, or use illegal drugs. Some items may interact with your medicine. What should I watch for while using this medication? Visit your care team for regular checks on your progress. Tell your care team if your symptoms do not start to get better or if they get worse. You will be tested for tuberculosis (TB) before you start this medication. If your care team prescribes any medication for TB, you should start taking the TB medication before starting this medication. Make sure to finish the full course of TB medication. This medication may increase your risk of getting an  infection. Call you care team if you get a fever, chills, sore throat, or other symptoms of a cold or flu. Do not treat yourself. Try to avoid being around people who are sick. Talk to your care team about your risk of cancer. You may be more at risk for certain types of cancers if you take this medication. What side effects may I notice from receiving this medication? Side effects that you should report to your care team as soon as possible: Allergic reactions--skin rash, itching, hives, swelling of the face, lips, tongue, or throat Aplastic anemia--unusual weakness or fatigue, dizziness, headache, trouble breathing, increased bleeding or bruising Body pain, tingling, or numbness Heart failure--shortness of breath, swelling of the ankles, feet, or hands, sudden weight gain, unusual weakness or fatigue Infection--fever, chills, cough, sore throat, wounds that don't heal, pain or trouble when passing urine, general feeling of discomfort or being unwell Lupus-like syndrome--joint pain, swelling, or stiffness, butterfly-shaped rash on the face, rashes that get worse in the sun, fever, unusual weakness or fatigue Unusual bruising or bleeding Side effects that usually do not require  medical attention (report to your care team if they continue or are bothersome): Dizziness Fever Increase in blood pressure Runny or stuffy nose Sore throat This list may not describe all possible side effects. Call your doctor for medical advice about side effects. You may report side effects to FDA at 1-800-FDA-1088. Where should I keep my medication? Infusions will be given in a hospital or clinic. They will not be stored at home. Storage for syringes or injectors given under the skin and stored at home: Keep out of the reach of children and pets. Refrigeration (preferred): Store in the refrigerator. Do not freeze. Do not shake. Keep this medication in the original container. Protect from light. Get rid of any unused  medication after the expiration date. Room Temperature: This medication may be stored at room temperature between 20 and 25 degrees C (68 and 77 degrees F) for up to 30 days. Do not put the medication back in the refrigerator after it reaches room temperature. Keep this medication in the original container. Protect from light. Do not freeze. Do not shake. If it is stored at room temperature, get rid any unused medication after 30 days or after it expires, whichever comes first. To get rid of medications that are no longer needed or have expired: Take the medication to a medication take-back program. Check with your pharmacy or law enforcement to find a location. If you cannot return the medication, ask your pharmacist or care team how to get rid of this medication safely. NOTE: This sheet is a summary. It may not cover all possible information. If you have questions about this medicine, talk to your doctor, pharmacist, or health care provider.  2024 Elsevier/Gold Standard (2021-08-31 00:00:00)

## 2022-12-12 ENCOUNTER — Telehealth: Payer: Self-pay | Admitting: Pharmacist

## 2022-12-12 ENCOUNTER — Other Ambulatory Visit (HOSPITAL_COMMUNITY): Payer: Self-pay

## 2022-12-12 LAB — IGG, IGA, IGM: IgG (Immunoglobin G), Serum: 709 mg/dL (ref 600–1540)

## 2022-12-12 LAB — HEPATITIS C ANTIBODY: Hepatitis C Ab: NONREACTIVE

## 2022-12-12 LAB — PROTEIN ELECTROPHORESIS, SERUM, WITH REFLEX: Total Protein: 6.7 g/dL (ref 6.1–8.1)

## 2022-12-12 NOTE — Progress Notes (Signed)
X-rays of both hands and feet are consistent with OA and RA overlap.  Erosive changes noted.   No comparison images.   Plan to repeat XR again in 2-3 years to assess for radiographic progression

## 2022-12-12 NOTE — Telephone Encounter (Signed)
Signed provider form for Simponi PAP received from Sherron Ales [facility]. Patient took home her portion of application and will return once completed.

## 2022-12-12 NOTE — Telephone Encounter (Addendum)
Submitted a Prior Authorization request to  [employer]  for SIMPONI SQ via CoverMyMeds. Will update once we receive a response.  Key: [id number]  Chesley Mires, PharmD, MPH, BCPS, CPP Clinical Pharmacist (Rheumatology and Pulmonology)  ----- Message from Ellen Henri, CMA sent at 12/11/2022  3:20 PM EDT ----- Please apply for Simponi SQ, per Sherron Ales, PA-C. Thanks!   Consent obtained and sent to the scan center, provided patient with patient assistance application. She will complete her portion and return to the office. Provider portion has been placed on Aryssa Rosamond's desk.

## 2022-12-13 ENCOUNTER — Other Ambulatory Visit (HOSPITAL_COMMUNITY): Payer: Self-pay

## 2022-12-13 LAB — HEPATITIS B SURFACE ANTIGEN: Hepatitis B Surface Ag: NONREACTIVE

## 2022-12-13 NOTE — Telephone Encounter (Signed)
Received notification from  [employer]  regarding a prior authorization for [facility]. Authorization has been APPROVED from 12/12/22 to 07/01/23. Approval letter sent to scan center.  Per test claim, copay for 28 days supply is $638.75  Patient can fill through [facility]: [phone]   Authorization # [id number]  Will move forward with pt assistance application once patient form is received  Chesley Mires, PharmD, MPH, BCPS, CPP Clinical Pharmacist (Rheumatology and Pulmonology)

## 2022-12-16 LAB — PROTEIN ELECTROPHORESIS, SERUM, WITH REFLEX
Beta Globulin: 0.4 g/dL (ref 0.4–0.6)
Gamma Globulin: 0.7 g/dL — ABNORMAL LOW (ref 0.8–1.7)

## 2022-12-16 NOTE — Telephone Encounter (Signed)
Received signed patient forms. Submitted Patient Assistance Application to  [employer]  for [facility] along with provider portion, patient portion, med list, insurance card copy, PA. Will update patient when we receive a response.  Phone: [phone] Fax:  [phone]  Chesley Mires, PharmD, MPH, BCPS, CPP Clinical Pharmacist (Rheumatology and Pulmonology)

## 2022-12-16 NOTE — Progress Notes (Signed)
Anemia has improved.  CMP WNL Hepatitis B and C negative  HIV negative  Immunoglobulins WNL  TB gold negative.

## 2022-12-17 ENCOUNTER — Ambulatory Visit: Payer: PPO | Admitting: Physician Assistant

## 2022-12-17 DIAGNOSIS — E559 Vitamin D deficiency, unspecified: Secondary | ICD-10-CM

## 2022-12-17 DIAGNOSIS — Z96643 Presence of artificial hip joint, bilateral: Secondary | ICD-10-CM

## 2022-12-17 DIAGNOSIS — M7701 Medial epicondylitis, right elbow: Secondary | ICD-10-CM

## 2022-12-17 DIAGNOSIS — Z79899 Other long term (current) drug therapy: Secondary | ICD-10-CM

## 2022-12-17 DIAGNOSIS — Z8639 Personal history of other endocrine, nutritional and metabolic disease: Secondary | ICD-10-CM

## 2022-12-17 DIAGNOSIS — M5136 Other intervertebral disc degeneration, lumbar region: Secondary | ICD-10-CM

## 2022-12-17 DIAGNOSIS — M0579 Rheumatoid arthritis with rheumatoid factor of multiple sites without organ or systems involvement: Secondary | ICD-10-CM

## 2022-12-17 DIAGNOSIS — Z8669 Personal history of other diseases of the nervous system and sense organs: Secondary | ICD-10-CM

## 2022-12-17 DIAGNOSIS — Z8679 Personal history of other diseases of the circulatory system: Secondary | ICD-10-CM

## 2022-12-17 DIAGNOSIS — M810 Age-related osteoporosis without current pathological fracture: Secondary | ICD-10-CM

## 2022-12-17 DIAGNOSIS — M7918 Myalgia, other site: Secondary | ICD-10-CM

## 2022-12-19 ENCOUNTER — Other Ambulatory Visit: Payer: Self-pay | Admitting: Cardiology

## 2022-12-19 LAB — QUANTIFERON-TB GOLD PLUS
Mitogen-NIL: >10 IU/mL
NIL: 0.04 IU/mL
QuantiFERON-TB Gold Plus: NEGATIVE
TB1-NIL: 0 IU/mL
TB2-NIL: <0 IU/mL

## 2022-12-19 LAB — CBC WITH DIFFERENTIAL/PLATELET
Absolute Monocytes: 690 cells/uL (ref 200–950)
Basophils Absolute: 81 cells/uL (ref 0–200)
Basophils Relative: 1.4 %
Eosinophils Absolute: 273 cells/uL (ref 15–500)
Eosinophils Relative: 4.7 %
HCT: 34.4 % — ABNORMAL LOW (ref 35.0–45.0)
Hemoglobin: 11.5 g/dL — ABNORMAL LOW (ref 11.7–15.5)
Lymphs Abs: 1433 cells/uL (ref 850–3900)
MCH: 32.1 pg (ref 27.0–33.0)
MCHC: 33.4 g/dL (ref 32.0–36.0)
MCV: 96.1 fL (ref 80.0–100.0)
MPV: 9.6 fL (ref 7.5–12.5)
Monocytes Relative: 11.9 %
Neutro Abs: 3323 cells/uL (ref 1500–7800)
Neutrophils Relative %: 57.3 %
Platelets: 250 10*3/uL (ref 140–400)
RBC: 3.58 10*6/uL — ABNORMAL LOW (ref 3.80–5.10)
RDW: 11.8 % (ref 11.0–15.0)
Total Lymphocyte: 24.7 %
WBC: 5.8 10*3/uL (ref 3.8–10.8)

## 2022-12-19 LAB — IFE INTERPRETATION

## 2022-12-19 LAB — COMPLETE METABOLIC PANEL WITH GFR
AG Ratio: 1.6 (calc) (ref 1.0–2.5)
ALT: 18 U/L (ref 6–29)
AST: 21 U/L (ref 10–35)
Albumin: 4 g/dL (ref 3.6–5.1)
Alkaline phosphatase (APISO): 74 U/L (ref 37–153)
BUN: 14 mg/dL (ref 7–25)
CO2: 21 mmol/L (ref 20–32)
Calcium: 9 mg/dL (ref 8.6–10.4)
Chloride: 106 mmol/L (ref 98–110)
Creat: 0.93 mg/dL (ref 0.60–1.00)
Globulin: 2.5 g/dL (calc) (ref 1.9–3.7)
Glucose, Bld: 84 mg/dL (ref 65–99)
Potassium: 4.2 mmol/L (ref 3.5–5.3)
Sodium: 139 mmol/L (ref 135–146)
Total Bilirubin: 0.6 mg/dL (ref 0.2–1.2)
Total Protein: 6.5 g/dL (ref 6.1–8.1)
eGFR: 65 mL/min/{1.73_m2} (ref >=60)

## 2022-12-19 LAB — PROTEIN ELECTROPHORESIS, SERUM, WITH REFLEX
Albumin ELP: 3.8 g/dL (ref 3.8–4.8)
Alpha 1: 0.3 g/dL (ref 0.2–0.3)
Alpha 2: 1.1 g/dL — ABNORMAL HIGH (ref 0.5–0.9)
Beta 2: 0.4 g/dL (ref 0.2–0.5)

## 2022-12-19 LAB — IGG, IGA, IGM
IgM, Serum: 84 mg/dL (ref 50–300)
Immunoglobulin A: 147 mg/dL (ref 70–320)

## 2022-12-19 LAB — HIV ANTIBODY (ROUTINE TESTING W REFLEX): HIV 1&2 Ab, 4th Generation: NONREACTIVE

## 2022-12-19 LAB — HEPATITIS B CORE ANTIBODY, IGM: Hep B C IgM: NONREACTIVE

## 2022-12-23 NOTE — Progress Notes (Signed)
IFE normal pattern

## 2022-12-24 NOTE — Telephone Encounter (Signed)
Spoke with [employer], per rep, they never received application. Resent application via fax [phone].  Darolyn Rua, PharmD Student [employer]

## 2023-01-08 NOTE — Telephone Encounter (Signed)
Called Leane Para to check status of PAP application for Simponi. Leane Para was unable to locate application and patient is not in their system at all. Was on the phone with them for over 30 minutes before call was disconnected. Re-faxed the patient's application to the fax number below.   Fax #: [phone]

## 2023-01-13 ENCOUNTER — Other Ambulatory Visit: Payer: Self-pay | Admitting: Rheumatology

## 2023-01-13 DIAGNOSIS — M0579 Rheumatoid arthritis with rheumatoid factor of multiple sites without organ or systems involvement: Secondary | ICD-10-CM

## 2023-01-13 MED ORDER — LEFLUNOMIDE 10 MG PO TABS: 10.0000 mg | ORAL_TABLET | Freq: Every day | ORAL | 0 refills | Status: DC

## 2023-01-13 NOTE — Telephone Encounter (Signed)
Pt is requesting gabapentin and leflunomide at the [facility].

## 2023-01-13 NOTE — Telephone Encounter (Signed)
Last Fill: 10/23/2022  Labs: 12/11/2022 Anemia has improved. CMP WNL  Next Visit: 02/05/2023  Last Visit: 12/11/2022  DX:  Rheumatoid arthritis with rheumatoid factor of multiple sites without organ or systems involvement   Current Dose per office note 12/11/2022: Arava 10 mg 1 tablet by mouth daily.   Okay to refill Arava ?

## 2023-01-15 ENCOUNTER — Other Ambulatory Visit: Payer: Self-pay | Admitting: *Deleted

## 2023-01-15 MED ORDER — GABAPENTIN 300 MG PO CAPS: ORAL_CAPSULE | ORAL | 0 refills | Status: DC

## 2023-01-15 NOTE — Telephone Encounter (Signed)
Patient called the office to request a refill on Gabapentin.  Last Fill: 10/23/2022  Next Visit: 02/05/2023  Last Visit: 12/11/2022  Dx: Myofascial pain syndrom   Current Dose per office note on 12/11/2022: gabapentin 300 mg 4 capsules by mouth daily.   Okay to refill Gabapentin?

## 2023-01-15 NOTE — Telephone Encounter (Signed)
Called Kari [name] and confirmed they have received all of the required paperwork for the application. Informed me they made need ~3 more business days to review the application. Suggested I wait until Monday 7/22 to receive a status update.  [facility] Patient Assistance Program Phone #: [phone]

## 2023-01-16 ENCOUNTER — Other Ambulatory Visit: Payer: Self-pay | Admitting: Cardiology

## 2023-01-16 ENCOUNTER — Other Ambulatory Visit: Payer: Self-pay | Admitting: Rheumatology

## 2023-01-16 DIAGNOSIS — M0579 Rheumatoid arthritis with rheumatoid factor of multiple sites without organ or systems involvement: Secondary | ICD-10-CM

## 2023-01-16 MED ORDER — AMLODIPINE BESYLATE 2.5 MG PO TABS: 2.5000 mg | ORAL_TABLET | Freq: Every day | ORAL | 3 refills | Status: DC

## 2023-01-20 NOTE — Telephone Encounter (Signed)
Called to check on status of PAP application. Was told they might need proof of income documents, but the lady who I spoke with wasn't sure. She said she would escalate the application to the team who reviews the application.  Will call and ask patient for income documentation in case we need it so there are no further delays in the application.

## 2023-01-20 NOTE — Telephone Encounter (Signed)
Spoke with patient. She will bring in income documents to clinic tomorrow (01/21/23).  Verified patient does not share income with anyone else. Her son lives in her house, but he files separate taxes. She will bring in documentation of her social security payments and pension payments.

## 2023-01-21 NOTE — Telephone Encounter (Signed)
Income docs received from patient. Faxed to Simponi PAP  Fax: [phone] Phone: [phone]  Chesley Mires, PharmD, MPH, BCPS, CPP Clinical Pharmacist (Rheumatology and Pulmonology)

## 2023-01-22 NOTE — Telephone Encounter (Signed)
Patient called stating Simponi has been approved through PAP. She received a call after 5pm regarding approval. Her shipment is scheduled to be received on 01/23/23.   She is scheduled for Simponi new start on 01/27/23. Will use sample. She confirms no active infection, antibiotic use, or upcoming surgeries  Chesley Mires, PharmD, MPH, BCPS, CPP Clinical Pharmacist (Rheumatology and Pulmonology)

## 2023-01-22 NOTE — Progress Notes (Deleted)
Office Visit Note  Patient: Kari [name]             Date of Birth: [birth year]           MRN: [id number]             PCP: [facility] [place] Referring: [facility] Visit Date: 02/05/2023 Occupation: @GUAROCC @  Subjective:  No chief complaint on file.   History of Present Illness: Kari [name] is a 72 y.o. female ***   Previous DEXA 02/21/20: Right total hip BMD 0.703 with T-score -2.0. Previously treated with fosamax and forteo (completed 2 years of treatment). Reclast IV on 06/18/2021. Updated DEXA: February 21, 2022 T-score -1.8, BMD 0.586 left one third radius which showed -6% change from August 2021.  AP total spine T-score +0.7, 5% improvement. DEXA results were discussed with the patient at length. Hold off Reclast until the next DEXA scan in 2025 per Dr.Deveshwar's recommendations.   Patient is taking a calcium and vitamin D supplement daily.    Activities of Daily Living:  Patient reports morning stiffness for *** {minute/hour:19697}.   Patient {ACTIONS;DENIES/REPORTS:21021675::"Denies"} nocturnal pain.  Difficulty dressing/grooming: {ACTIONS;DENIES/REPORTS:21021675::"Denies"} Difficulty climbing stairs: {ACTIONS;DENIES/REPORTS:21021675::"Denies"} Difficulty getting out of chair: {ACTIONS;DENIES/REPORTS:21021675::"Denies"} Difficulty using hands for taps, buttons, cutlery, and/or writing: {ACTIONS;DENIES/REPORTS:21021675::"Denies"}  No Rheumatology ROS completed.   PMFS History:  Patient Active Problem List   Diagnosis Date Noted   Moderate, nonobstructive CAD (coronary artery disease) 04/11/2022   Dizziness with bending over 04/11/2022   Stage 3a chronic kidney disease (CKD) (HCC) 04/11/2022   Anemia 04/11/2022   Class 2 obesity 04/11/2022   Precordial chest pain    Rheumatoid arthritis with rheumatoid factor of multiple sites without organ or systems involvement (HCC) 10/17/2020   High risk medication use 10/17/2020   History  of total replacement of both hip joints 10/17/2020   DDD (degenerative disc disease), lumbar 10/17/2020   Myofascial pain syndrome 10/17/2020   Age-related osteoporosis without current pathological fracture 10/17/2020   Frequent falls 10/17/2020   Hyperlipidemia 07/14/2015   Palpitation 05/11/2014   PVC (premature ventricular contraction) 05/11/2014   Hypokalemia 05/11/2014   Essential hypertension 05/11/2014   Mitral regurgitation 05/11/2014   Arthritis, hip 02/03/2013    Past Medical History:  Diagnosis Date   Anemia    Arthritis    Class 2 obesity    Complication of anesthesia    when block attempted ax -turned red in preop-surg delayed   Dizziness    Fibromyalgia    GERD (gastroesophageal reflux disease)    HLD (hyperlipidemia)    Hypertension    Hypothyroidism    Mild to moderate, nonobstructive CAD (coronary artery disease)    MVP (mitral valve prolapse)    Osteoporosis    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   Rheumatoid aortitis    ? arthritis   Stage 3a chronic kidney disease (CKD) (HCC)    Unstable angina (HCC)    cath revealed moderate, nonobstructive CAD.  50% stenosis of mid RCA with 25% stenosis of distal LAD, EF 55 to 65%.    Family History  Problem Relation Age of Onset   Heart attack Father    Heart disease Mother    Stroke Mother    Colon cancer Other    Colon polyps Other    Hypothyroidism Son    Past Surgical History:  Procedure Laterality Date   ABDOMINAL HYSTERECTOMY  CESAREAN SECTION     CHOLECYSTECTOMY     COLONOSCOPY     JOINT REPLACEMENT     KNEE ARTHROSCOPY Right 2/11   LEFT HEART CATH AND CORONARY ANGIOGRAPHY N/A 03/26/2022   Procedure: LEFT HEART CATH AND CORONARY ANGIOGRAPHY;  Surgeon: Corky Crafts, MD;  Location: [facility] INVASIVE CV LAB;  Service: Cardiovascular;  Laterality: N/A;   TOTAL HIP ARTHROPLASTY Left 02/03/2013   Procedure:  TOTAL HIP ARTHROPLASTY;  Surgeon: Nestor Lewandowsky, MD;  Location: [facility] OR;  Service: Orthopedics;  Laterality: Left;   TOTAL HIP ARTHROPLASTY Right 09/22/2020   Dr. Vilinda Flake NERVE TRANSPOSITION  09/17/2011   Procedure: ULNAR NERVE DECOMPRESSION/TRANSPOSITION;  Surgeon: Wyn Forster., MD;  Location: [facility];  Service: Orthopedics;  Laterality: Left;  decompression   WRIST ARTHROSCOPY Right    x2-fusion     WRIST ARTHROSCOPY Left    Social History   Social History Narrative   Not on file   Immunization History  Administered Date(s) Administered   PFIZER(Purple Top)SARS-COV-2 Vaccination 08/05/2019, 08/26/2019, 02/14/2020, 01/16/2021     Objective: Vital Signs: There were no vitals taken for this visit.   Physical Exam   Musculoskeletal Exam: ***  CDAI Exam: CDAI Score: -- Patient Global: --; Provider Global: -- Swollen: --; Tender: -- Joint Exam 02/05/2023   No joint exam has been documented for this visit   There is currently no information documented on the homunculus. Go to the Rheumatology activity and complete the homunculus joint exam.  Investigation: No additional findings.  Imaging: No results found.  Recent Labs: Lab Results  Component Value Date   WBC 5.8 12/11/2022   HGB 11.5 (L) 12/11/2022   PLT 250 12/11/2022   NA 139 12/11/2022   K 4.2 12/11/2022   CL 106 12/11/2022   CO2 21 12/11/2022   GLUCOSE 84 12/11/2022   BUN 14 12/11/2022   CREATININE 0.93 12/11/2022   BILITOT 0.6 12/11/2022   ALKPHOS 72 01/20/2017   AST 21 12/11/2022   ALT 18 12/11/2022   PROT 6.5 12/11/2022   PROT 6.7 12/11/2022   ALBUMIN 4.2 01/20/2017   CALCIUM 9.0 12/11/2022   GFRAA 72 12/04/2020   QFTBGOLDPLUS NEGATIVE 12/11/2022    Speciality Comments: Recieves Enbrel through [employer] Program  Procedures:  No procedures performed Allergies: Humira [adalimumab], Clinoril [sulindac], Erythromycin, Hydrocodone, Ibuprofen, Penicillins,  Plaquenil [hydroxychloroquine], Sulfa antibiotics, Trimethoprim, and Vancomycin cross reactors   Assessment / Plan:     Visit Diagnoses: Rheumatoid arthritis with rheumatoid factor of multiple sites without organ or systems involvement (HCC)  High risk medication use  Medial epicondylitis of both elbows  History of total replacement of both hip joints  DDD (degenerative disc disease), lumbar  Myofascial pain syndrome  Age-related osteoporosis without current pathological fracture  Vitamin D deficiency  History of hypertension  History of hyperlipidemia  History of hypothyroidism  History of neuropathy  Orders: No orders of the defined types were placed in this encounter.  No orders of the defined types were placed in this encounter.   Face-to-face time spent with patient was *** minutes. Greater than 50% of time was spent in counseling and coordination of care.  Follow-Up Instructions: No follow-ups on file.   Gearldine Bienenstock, PA-C  Note - This record has been created using Dragon software.  Chart creation errors have been sought, but may not always  have been located. Such creation errors do not reflect on  the standard of medical  care.

## 2023-01-24 ENCOUNTER — Other Ambulatory Visit: Payer: Self-pay | Admitting: Rheumatology

## 2023-01-24 DIAGNOSIS — M0579 Rheumatoid arthritis with rheumatoid factor of multiple sites without organ or systems involvement: Secondary | ICD-10-CM

## 2023-01-27 ENCOUNTER — Other Ambulatory Visit: Payer: Self-pay | Admitting: Rheumatology

## 2023-01-27 ENCOUNTER — Ambulatory Visit: Payer: PPO | Attending: Rheumatology | Admitting: Pharmacist

## 2023-01-27 DIAGNOSIS — Z7189 Other specified counseling: Secondary | ICD-10-CM

## 2023-01-27 DIAGNOSIS — M0579 Rheumatoid arthritis with rheumatoid factor of multiple sites without organ or systems involvement: Secondary | ICD-10-CM

## 2023-01-27 DIAGNOSIS — Z79899 Other long term (current) drug therapy: Secondary | ICD-10-CM

## 2023-01-27 MED ORDER — SIMPONI 50 MG/0.5ML ~~LOC~~ SOAJ
50.0000 mg | SUBCUTANEOUS | Status: AC
Start: 2023-01-27 — End: ?

## 2023-01-27 NOTE — Patient Instructions (Addendum)
Your next SIMPONI dose is due on 02/27/23, 03/30/23, and every month thereafter  CONTINUE leflunomide 10mg  daily  HOLD SIMPONI and LEFLUNOMIDE if you have signs or symptoms of an infection. You can resume once you feel better or back to your baseline. HOLD SIMPONI and LEFLUNOMIDE if you start antibiotics to treat an infection. HOLD SIMPONI and LEFLUNOMIDE around the time of surgery/procedures. Your surgeon will be able to provide recommendations on when to hold BEFORE and when you are cleared to RESUME.  Pharmacy information: Your prescription will be shipped from [facility]. Their phone number is [phone] Please call to schedule shipment and confirm address. They will mail your medication to your home.  Labs are due in 1 month then every 3 months. Lab hours are from Monday to Thursday 8am-12:30pm and 1pm-5pm and Friday 8am-12pm. You do not need an appointment if you come for labs during these times.  How to manage an injection site reaction: Remember the 5 C's: COUNTER - leave on the counter at least 30 minutes but up to overnight to bring medication to room temperature. This may help prevent stinging COLD - place something cold (like an ice gel pack or cold water bottle) on the injection site just before cleansing with alcohol. This may help reduce pain CLARITIN - use Claritin (generic name is loratadine) for the first two weeks of treatment or the day of, the day before, and the day after injecting. This will help to minimize injection site reactions CORTISONE CREAM - apply if injection site is irritated and itching CALL ME - if injection site reaction is bigger than the size of your fist, looks infected, blisters, or if you develop hives

## 2023-01-27 NOTE — Progress Notes (Signed)
Pharmacy Note  Subjective:   Patient presents to clinic today to receive first dose of Simponi SQ for seropositive RA. Patient currently takes leflunomide 10mg  daily. Previously on Enbrel but d/c earlier this year due to no longer qualifying for patient assistance  Patient running a fever or have signs/symptoms of infection? No  Patient currently on antibiotics for the treatment of infection? No  Patient have any upcoming invasive procedures/surgeries? No  Objective: CMP     Component Value Date/Time   NA 139 12/11/2022 1442   NA 141 03/25/2022 1008   K 4.2 12/11/2022 1442   CL 106 12/11/2022 1442   CO2 21 12/11/2022 1442   GLUCOSE 84 12/11/2022 1442   BUN 14 12/11/2022 1442   BUN 17 03/25/2022 1008   CREATININE 0.93 12/11/2022 1442   CALCIUM 9.0 12/11/2022 1442   PROT 6.5 12/11/2022 1442   PROT 6.7 12/11/2022 1442   ALBUMIN 4.2 01/20/2017 1049   AST 21 12/11/2022 1442   ALT 18 12/11/2022 1442   ALKPHOS 72 01/20/2017 1049   BILITOT 0.6 12/11/2022 1442   GFRNONAA 62 12/04/2020 0000   GFRAA 72 12/04/2020 0000    CBC    Component Value Date/Time   WBC 5.8 12/11/2022 1442   RBC 3.58 (L) 12/11/2022 1442   HGB 11.5 (L) 12/11/2022 1442   HGB 10.8 (L) 03/25/2022 1008   HCT 34.4 (L) 12/11/2022 1442   HCT 32.1 (L) 03/25/2022 1008   PLT 250 12/11/2022 1442   PLT 210 03/25/2022 1008   MCV 96.1 12/11/2022 1442   MCV 97 03/25/2022 1008   MCH 32.1 12/11/2022 1442   MCHC 33.4 12/11/2022 1442   RDW 11.8 12/11/2022 1442   RDW 13.0 03/25/2022 1008   LYMPHSABS 1,433 12/11/2022 1442   LYMPHSABS 1.5 03/25/2022 1008   MONOABS 480 01/20/2017 1049   EOSABS 273 12/11/2022 1442   EOSABS 0.5 (H) 03/25/2022 1008   BASOSABS 81 12/11/2022 1442   BASOSABS 0.1 03/25/2022 1008    Baseline Immunosuppressant Therapy Labs TB GOLD    Latest Ref Rng & Units 12/11/2022    2:42 PM  Quantiferon TB Gold  Quantiferon TB Gold Plus NEGATIVE NEGATIVE    Hepatitis Panel    Latest Ref Rng &  Units 12/11/2022    2:42 PM  Hepatitis  Hep B Surface Ag NON-REACTIVE NON-REACTIVE   Hep B IgM NON-REACTIVE NON-REACTIVE   Hep C Ab NON-REACTIVE NON-REACTIVE    HIV Lab Results  Component Value Date   HIV NON-REACTIVE 12/11/2022   Immunoglobulins    Latest Ref Rng & Units 12/11/2022    2:42 PM  Immunoglobulin Electrophoresis  IgA  70 - 320 mg/dL 161   IgG 096 - 0,454 mg/dL 098   IgM 50 - 119 mg/dL 84    SPEP    Latest Ref Rng & Units 12/11/2022    2:42 PM  Serum Protein Electrophoresis  Total Protein 6.1 - 8.1 g/dL 6.1 - 8.1 g/dL 6.5    6.7   Albumin 3.8 - 4.8 g/dL 3.8   Alpha-1 0.2 - 0.3 g/dL 0.3   Alpha-2 0.5 - 0.9 g/dL 1.1   Beta Globulin 0.4 - 0.6 g/dL 0.4   Beta 2 0.2 - 0.5 g/dL 0.4   Gamma Globulin 0.8 - 1.7 g/dL 0.7   Interpretation  --    Chest x-ray: 01/26/2013 - No active disease.  No significant change  Assessment/Plan:  Reviewed importance of holding Simponi and leflunomide with signs/symptoms of an infections, if antibiotics  are prescribed to treat an active infection, and with invasive procedures  Demonstrated proper injection technique with Simponi pen demo device  Patient able to demonstrate proper injection technique using the teach back method.  Patient self injected in the left upper thigh with:  Sample Medication: Simponi 50mg /mL autoinjector NDC: [address]-30 Lot: [id number] Expiration: 03/2025  Patient tolerated well.  Observed for 30 mins in office for adverse reaction and none noted. Patient denies itchiness and irritation. Reviewed injection site reaction management and printed in AVS for future reference  Patient is to return in 1 month for labs and 6-8 weeks for follow-up appointment.  Standing orders for CBC/CMP remain in place.  TB gold will be monitored yearly.  [facility]erral [facility] placed today for yearly skin checks while on TNF inhibitor due to risk for non melanoma skin cancer  Simponi approved through patient assistance  .   Rx sent to:  [facility] (with pt assistance application) .  Patient provided with pharmacy phone number. She has already received shipment at home.  Patient will continue Simponi 50mg  subcut every 30 days in combination with leflunomide 10mg  daily  All questions encouraged and answered.  Instructed patient to call with any further questions or concerns.  Chesley Mires, PharmD, MPH, BCPS, CPP Clinical Pharmacist (Rheumatology and Pulmonology)  01/27/2023 8:11 AM

## 2023-02-05 ENCOUNTER — Ambulatory Visit: Payer: PPO | Admitting: Physician Assistant

## 2023-02-05 DIAGNOSIS — M5136 Other intervertebral disc degeneration, lumbar region: Secondary | ICD-10-CM

## 2023-02-05 DIAGNOSIS — M0579 Rheumatoid arthritis with rheumatoid factor of multiple sites without organ or systems involvement: Secondary | ICD-10-CM

## 2023-02-05 DIAGNOSIS — M810 Age-related osteoporosis without current pathological fracture: Secondary | ICD-10-CM

## 2023-02-05 DIAGNOSIS — E559 Vitamin D deficiency, unspecified: Secondary | ICD-10-CM

## 2023-02-05 DIAGNOSIS — Z79899 Other long term (current) drug therapy: Secondary | ICD-10-CM

## 2023-02-05 DIAGNOSIS — Z96643 Presence of artificial hip joint, bilateral: Secondary | ICD-10-CM

## 2023-02-05 DIAGNOSIS — Z8669 Personal history of other diseases of the nervous system and sense organs: Secondary | ICD-10-CM

## 2023-02-05 DIAGNOSIS — Z8679 Personal history of other diseases of the circulatory system: Secondary | ICD-10-CM

## 2023-02-05 DIAGNOSIS — M7918 Myalgia, other site: Secondary | ICD-10-CM

## 2023-02-05 DIAGNOSIS — Z8639 Personal history of other endocrine, nutritional and metabolic disease: Secondary | ICD-10-CM

## 2023-02-05 DIAGNOSIS — M7701 Medial epicondylitis, right elbow: Secondary | ICD-10-CM

## 2023-02-10 NOTE — Progress Notes (Unsigned)
Office Visit Note  Patient: Kari [name]             Date of Birth: 14-Jun-1951           MRN: [id number]             PCP: [facility] [place] Referring: [facility] Visit Date: 02/12/2023 Occupation: @GUAROCC @  Subjective:  Right foot swelling   History of Present Illness: Kari [name] is a 72 y.o. female with history of seropositive rheumatoid arthritis.  Patient remains on Arava 10 mg 1 tablet daily and was started on Simponi sq injections on 01/27/23.  She is tolerating combination therapy without any interruptions.  She denies any recent or recurrent infections.  Patient presents today with increased pain and inflammation in her right foot and ankle which started 2 weeks ago.  She denies any injury or fall prior to the onset of symptoms.  Patient states that the pain has been so severe that at times she cannot trust the ankle and feels that she is going to fall.  She has tried ice and topical agents with no relief.  She is also tried a compression wrap as well as wearing a boot with no relief.  She states at times the pain is a 10 out of 10 in her right foot and ankle.  She denies any other joint pain or inflammation currently.    Activities of Daily Living:  Patient reports morning stiffness for all day. Patient Reports nocturnal pain.  Difficulty dressing/grooming: Denies Difficulty climbing stairs: Reports Difficulty getting out of chair: Reports Difficulty using hands for taps, buttons, cutlery, and/or writing: Denies  Review of Systems  Constitutional:  Positive for fatigue.  HENT:  Positive for mouth dryness. Negative for mouth sores.   Eyes:  Positive for dryness.  Respiratory:  Negative for shortness of breath.   Cardiovascular:  Negative for chest pain and palpitations.  Gastrointestinal:  Negative for blood in stool, constipation and diarrhea.  Endocrine: Negative for increased urination.  Genitourinary:  Negative for involuntary  urination.  Musculoskeletal:  Positive for joint pain, gait problem, joint pain, joint swelling and morning stiffness. Negative for myalgias, muscle weakness, muscle tenderness and myalgias.  Skin:  Positive for sensitivity to sunlight. Negative for color change, rash and hair loss.  Allergic/Immunologic: Positive for susceptible to infections.  Neurological:  Negative for dizziness and headaches.  Hematological:  Negative for swollen glands.  Psychiatric/Behavioral:  Positive for depressed mood. Negative for sleep disturbance. The patient is nervous/anxious.     PMFS History:  Patient Active Problem List   Diagnosis Date Noted   Moderate, nonobstructive CAD (coronary artery disease) 04/11/2022   Dizziness with bending over 04/11/2022   Stage 3a chronic kidney disease (CKD) (HCC) 04/11/2022   Anemia 04/11/2022   Class 2 obesity 04/11/2022   Precordial chest pain    Rheumatoid arthritis with rheumatoid factor of multiple sites without organ or systems involvement (HCC) 10/17/2020   High risk medication use 10/17/2020   History of total replacement of both hip joints 10/17/2020   DDD (degenerative disc disease), lumbar 10/17/2020   Myofascial pain syndrome 10/17/2020   Age-related osteoporosis without current pathological fracture 10/17/2020   Frequent falls 10/17/2020   Hyperlipidemia 07/14/2015   Palpitation 05/11/2014   PVC (premature ventricular contraction) 05/11/2014   Hypokalemia 05/11/2014   Essential hypertension 05/11/2014   Mitral regurgitation 05/11/2014   Arthritis, hip 02/03/2013    Past Medical History:  Diagnosis Date  Anemia    Arthritis    Class 2 obesity    Complication of anesthesia    when block attempted ax -turned red in preop-surg delayed   Dizziness    Fibromyalgia    GERD (gastroesophageal reflux disease)    HLD (hyperlipidemia)    Hypertension    Hypothyroidism    Mild to moderate, nonobstructive CAD (coronary artery disease)    MVP (mitral  valve prolapse)    Osteoporosis    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   Rheumatoid aortitis    ? arthritis   Stage 3a chronic kidney disease (CKD) (HCC)    Unstable angina (HCC)    cath revealed moderate, nonobstructive CAD.  50% stenosis of mid RCA with 25% stenosis of distal LAD, EF 55 to 65%.    Family History  Problem Relation Age of Onset   Heart attack Father    Heart disease Mother    Stroke Mother    Colon cancer Other    Colon polyps Other    Hypothyroidism Son    Past Surgical History:  Procedure Laterality Date   ABDOMINAL HYSTERECTOMY     CESAREAN SECTION     CHOLECYSTECTOMY     COLONOSCOPY     JOINT REPLACEMENT     KNEE ARTHROSCOPY Right 2/11   LEFT HEART CATH AND CORONARY ANGIOGRAPHY N/A 03/26/2022   Procedure: LEFT HEART CATH AND CORONARY ANGIOGRAPHY;  Surgeon: Corky Crafts, MD;  Location: [facility];  Service: Cardiovascular;  Laterality: N/A;   TOTAL HIP ARTHROPLASTY Left 02/03/2013   Procedure: TOTAL HIP ARTHROPLASTY;  Surgeon: Nestor Lewandowsky, MD;  Location: [facility] OR;  Service: Orthopedics;  Laterality: Left;   TOTAL HIP ARTHROPLASTY Right 09/22/2020   Dr. Vilinda Flake NERVE TRANSPOSITION  09/17/2011   Procedure: ULNAR NERVE DECOMPRESSION/TRANSPOSITION;  Surgeon: Wyn Forster., MD;  Location: [facility];  Service: Orthopedics;  Laterality: Left;  decompression   WRIST ARTHROSCOPY Right    x2-fusion     WRIST ARTHROSCOPY Left    Social History   Social History Narrative   Not on file   Immunization History  Administered Date(s) Administered   PFIZER(Purple Top)SARS-COV-2 Vaccination 08/05/2019, 08/26/2019, 02/14/2020, 01/16/2021     Objective: Vital Signs: BP 130/76 (BP Location: Left Arm, Patient Position: Sitting, Cuff Size: Normal)   Pulse 79   Resp 15   Ht 5' 2.5" (1.588 m)   Wt 201 lb 12.8 oz (91.5 kg)   BMI 36.32  kg/m    Physical Exam Vitals and nursing note reviewed.  Constitutional:      Appearance: She is well-developed.  HENT:     Head: Normocephalic and atraumatic.  Eyes:     Conjunctiva/sclera: Conjunctivae normal.  Cardiovascular:     Rate and Rhythm: Normal rate and regular rhythm.     Heart sounds: Normal heart sounds.  Pulmonary:     Effort: Pulmonary effort is normal.     Breath sounds: Normal breath sounds.  Abdominal:     General: Bowel sounds are normal.     Palpations: Abdomen is soft.  Musculoskeletal:     Cervical back: Normal range of motion.  Lymphadenopathy:     Cervical: No cervical adenopathy.  Skin:    General: Skin is warm and dry.     Capillary Refill: Capillary refill takes less than 2 seconds.  Neurological:     Mental  Status: She is alert and oriented to person, place, and time.  Psychiatric:        Behavior: Behavior normal.      Musculoskeletal Exam: C-spine, thoracic spine, and lumbar spine good ROM.  Shoulder joints and elbow joints have good ROM.  Limited extension of both wrists, right more severe than left.  No tenderness or synovitis of MCP joints.  Hip replacements have good ROM.  Knee joints have good ROM with no warmth or effusion.  Tenderness over the base of the right 5th metatarsal. Diffuse swelling on the dorsal aspect of the right foot.  No ecchymosis noted.  No tenderness over the MTP joints.  CDAI Exam: CDAI Score: -- Patient Global: 20 / 100; Provider Global: 20 / 100 Swollen: --; Tender: -- Joint Exam 02/12/2023   No joint exam has been documented for this visit     Investigation: No additional findings.  Imaging: XR Foot Complete Right  Result Date: 02/12/2023 First MTP, PIP and DIP narrowing was noted.  No intertarsal, tibiotalar or subtalar joint space narrowing was noted.  Inferior calcaneal spur was noted.  A fracture at the base of fifth metatarsal was noted.  Fracture was not noted on her previous films taken in June  2024. Impression: X-rays were consistent with osteoarthritis.  Fracture was noted at the base of the fifth metatarsal.   Recent Labs: Lab Results  Component Value Date   WBC 5.8 12/11/2022   HGB 11.5 (L) 12/11/2022   PLT 250 12/11/2022   NA 139 12/11/2022   K 4.2 12/11/2022   CL 106 12/11/2022   CO2 21 12/11/2022   GLUCOSE 84 12/11/2022   BUN 14 12/11/2022   CREATININE 0.93 12/11/2022   BILITOT 0.6 12/11/2022   ALKPHOS 72 01/20/2017   AST 21 12/11/2022   ALT 18 12/11/2022   PROT 6.5 12/11/2022   PROT 6.7 12/11/2022   ALBUMIN 4.2 01/20/2017   CALCIUM 9.0 12/11/2022   GFRAA 72 12/04/2020   QFTBGOLDPLUS NEGATIVE 12/11/2022    Speciality Comments: Enbrel - stopped Jan 2024 d/t losing PAP coverage  Simponi started 01/27/23  Procedures:  No procedures performed Allergies: Humira [adalimumab], Clinoril [sulindac], Erythromycin, Hydrocodone, Ibuprofen, Penicillins, Plaquenil [hydroxychloroquine], Sulfa antibiotics, Trimethoprim, and Vancomycin cross reactors   Assessment / Plan:     Visit Diagnoses: Rheumatoid arthritis with rheumatoid factor of multiple sites without organ or systems involvement [facility]): Patient was initiated on Simponi 50 mg subcu days injections on 01/27/2023.  She tolerated the first injection without any side effects or injection site reactions.  She remains on Arava 10 mg 1 tablet by mouth daily.  She is tolerating combination therapy without any side effects and has not had any interruptions in therapy.  Patient presents today with pain and swelling in the right foot.  X-rays were obtained which revealed an acute fracture at the base of the right fifth metatarsal.  Patient will be schedule a follow-up visit today at [facility] orthopedics for further management. She is not experiencing other inflammation in any other joints currently.  She will remain on Simponi and Arava as prescribed.  She is vies notify us if she develops signs or symptoms of a flare.  She will  follow-up in the office in October as scheduled.  High risk medication use -Simponi 50 mg sq injections once monthly--started on 01/27/23. Arava 10 mg 1 tablet by mouth daily.  Discontinued enbrel due to no longer qualifying for patient assistance.  CBC and CMP updated on 12/11/22.  Patient plans on returning for lab work at the end of the month.  Future orders for CBC and CMP were placed today. TB gold negative on 12/11/22.  No recent or recurrent infections.  Discussed the importance of holding simponi and arava if she develops signs or symptoms of an infection and to resume once the infection has completely cleared.   Plan: CBC with Differential/Platelet, COMPLETE METABOLIC PANEL WITH GFR  Medial epicondylitis of both elbows: Not currently symptomatic.   History of total replacement of both hip joints: Doing well.  Good ROM with no groin pain.   Pain in right foot - Patient presents today with right foot and ankle pain which started 2 weeks ago. No recent injury or fall.   X-rays of the right foot were updated today--acute fracture at the base of the right fifth metatarsal noted.  Patient plans on calling [facility] today to for further management. Discussed initiating Evenity given history of right foot fracture x2.   Plan: XR Foot Complete Right  DDD (degenerative disc disease), lumbar: Intermittent discomfort.  Myofascial pain syndrome: Patient remains on gabapentin 300 mg 4 capsules by mouth daily.  Age-related osteoporosis without current pathological fracture: Previous DEXA 02/21/20: Right total hip BMD 0.703 with T-score -2.0. Previously treated with fosamax and forteo (completed 2 years of treatment). Reclast IV on 06/18/2021. Updated DEXA: February 21, 2022 T-score -1.8, BMD 0.586 left one third radius which showed -6% change from August 2021.  AP total spine T-score +0.7, 5% improvement.  X-rays of the right foot were obtained today which revealed a fracture at the base  of the fifth metatarsal.  No fall or injury.  Patient will be following up at [facility] for further treatment. Reviewed X-ray with Dr. Laurene Footman initiating Evenity due to stress fracture.   Discussed indications, contraindications, and potential side effects of evenity today in detail.  All questions were addressed.  Plan to apply for Evenity through her insurance.   CBC, CMP, and vitamin D will be rechecked in 2 weeks prior to initiating evenity.   Vitamin D deficiency: She is taking vitamin D 2000 units daily.  Other medical conditions are listed as follows:  History of hypertension: Blood pressure was 130/76 today in the office.  History of hyperlipidemia  History of hypothyroidism  History of neuropathy    Orders: Orders Placed This Encounter  Procedures   XR Foot Complete Right   CBC with Differential/Platelet   COMPLETE METABOLIC PANEL WITH GFR   No orders of the defined types were placed in this encounter.  Follow-Up Instructions: Return for Rheumatoid arthritis.   Gearldine Bienenstock, PA-C  Note - This record has been created using Dragon software.  Chart creation errors have been sought, but may not always  have been located. Such creation errors do not reflect on  the standard of medical care.

## 2023-02-12 ENCOUNTER — Ambulatory Visit: Payer: PPO | Attending: Physician Assistant | Admitting: Physician Assistant

## 2023-02-12 ENCOUNTER — Telehealth: Payer: Self-pay

## 2023-02-12 ENCOUNTER — Encounter: Payer: Self-pay | Admitting: *Deleted

## 2023-02-12 ENCOUNTER — Ambulatory Visit (INDEPENDENT_AMBULATORY_CARE_PROVIDER_SITE_OTHER): Payer: PPO

## 2023-02-12 ENCOUNTER — Encounter: Payer: Self-pay | Admitting: Physician Assistant

## 2023-02-12 ENCOUNTER — Other Ambulatory Visit: Payer: Self-pay | Admitting: Pharmacist

## 2023-02-12 VITALS — BP 130/76 | HR 79 | Resp 15 | Ht 62.5 in | Wt 201.8 lb

## 2023-02-12 DIAGNOSIS — M7701 Medial epicondylitis, right elbow: Secondary | ICD-10-CM

## 2023-02-12 DIAGNOSIS — M5136 Other intervertebral disc degeneration, lumbar region: Secondary | ICD-10-CM

## 2023-02-12 DIAGNOSIS — M79671 Pain in right foot: Secondary | ICD-10-CM

## 2023-02-12 DIAGNOSIS — E559 Vitamin D deficiency, unspecified: Secondary | ICD-10-CM

## 2023-02-12 DIAGNOSIS — Z79899 Other long term (current) drug therapy: Secondary | ICD-10-CM

## 2023-02-12 DIAGNOSIS — M0579 Rheumatoid arthritis with rheumatoid factor of multiple sites without organ or systems involvement: Secondary | ICD-10-CM | POA: Diagnosis not present

## 2023-02-12 DIAGNOSIS — Z96643 Presence of artificial hip joint, bilateral: Secondary | ICD-10-CM | POA: Diagnosis not present

## 2023-02-12 DIAGNOSIS — Z7189 Other specified counseling: Secondary | ICD-10-CM

## 2023-02-12 DIAGNOSIS — Z8669 Personal history of other diseases of the nervous system and sense organs: Secondary | ICD-10-CM

## 2023-02-12 DIAGNOSIS — M7702 Medial epicondylitis, left elbow: Secondary | ICD-10-CM

## 2023-02-12 DIAGNOSIS — Z8639 Personal history of other endocrine, nutritional and metabolic disease: Secondary | ICD-10-CM

## 2023-02-12 DIAGNOSIS — Z8679 Personal history of other diseases of the circulatory system: Secondary | ICD-10-CM

## 2023-02-12 DIAGNOSIS — M7918 Myalgia, other site: Secondary | ICD-10-CM

## 2023-02-12 DIAGNOSIS — M810 Age-related osteoporosis without current pathological fracture: Secondary | ICD-10-CM

## 2023-02-12 NOTE — Progress Notes (Signed)
Therapy plan placed for Kari [name] for [name] to start benefits investigation  History of osteoporosis - now right foot fracture Completed 2 years of Forteo; 2 years of Reclast  Provider: Sherron Ales, PA-C; Dr. Pollyann Savoy  Dose: 210mg  every month x 12 months  Last Clinic Visit: 02/12/23 Next Clinic Visit: 04/02/2023  Pertinent Labs: patient to repeat CMP and Vitamin D at the end of August 2024  Kari [name], PharmD, MPH, BCPS, CPP Clinical Pharmacist (Rheumatology and Pulmonology)

## 2023-02-12 NOTE — Telephone Encounter (Signed)
   Pre-operative Risk Assessment    Patient Name: Kari [name]  DOB: 1950/07/12 MRN: [id number]     Request for Surgical Clearance    Procedure:  Right ORIF metatarsal fracture   Date of Surgery:  Clearance TBD                                 Surgeon:  Dr. Willette Cluster  Surgeon's Group or Practice Name:  [facility]  Phone number:  [phone] Fax number:  [phone]   Type of Clearance Requested:   - Medical    Type of Anesthesia:  Choice    Additional requests/questions:    SignedVernard Gambles   02/12/2023, 4:57 PM

## 2023-02-12 NOTE — Progress Notes (Unsigned)
Pharmacy Note  Subjective:  Patient presents today to [facility] for follow up office visit.  Per right foot xray, appears to have fracture on base of fifth metatarsal. This has not been triggered by fall. Patient was seen by the pharmacist for counseling on Evenity. Cardiac history significant for PVC, CAD, HTN.  History of cardiovascular events in the past 12 months? No but she is established with cardiologist  Objective: CMP     Component Value Date/Time   NA 139 12/11/2022 1442   NA 141 03/25/2022 1008   K 4.2 12/11/2022 1442   CL 106 12/11/2022 1442   CO2 21 12/11/2022 1442   GLUCOSE 84 12/11/2022 1442   BUN 14 12/11/2022 1442   BUN 17 03/25/2022 1008   CREATININE 0.93 12/11/2022 1442   CALCIUM 9.0 12/11/2022 1442   PROT 6.5 12/11/2022 1442   PROT 6.7 12/11/2022 1442   ALBUMIN 4.2 01/20/2017 1049   AST 21 12/11/2022 1442   ALT 18 12/11/2022 1442   ALKPHOS 72 01/20/2017 1049   BILITOT 0.6 12/11/2022 1442   GFRNONAA 62 12/04/2020 0000   GFRAA 72 12/04/2020 0000    Vitamin D Lab Results  Component Value Date   VD25OH 92 05/15/2020   DEXA:    Assessment/Plan:  Counseled patient on purpose, proper use, and adverse effects of Evenity.  Counseled patient that Sheilah Mins is a medication that must be injected once monthly for 12 months by a healthcare professional.  Advised patient to take calcium 1200 mg daily and vitamin D 800 units daily.  Reviewed the most common adverse effects of Evenity including headache, osteonecrosis of the jaw, and muscle/bone pain.  Reviewed that Sheilah Mins has FDA boxed warning for major adverse cardiovascular events. Reviewed that Evenity may increase the risk of myocardial infarction, stroke, and cardiovascular death. Confirmed that patient has not had MI or stroke within the preceding year.  Patient confirms she does not have any major dental work planned at this time.  Reviewed with patient the signs/symptoms of low calcium and advised  patient to alert [place] if she experiences these symptoms.  Provided patient with medication education material and answered all questions. Will apply for Evenity through patient's insurance. Referral placed to [employer]. She will be able to start after CMP and Vitamin D are updated at the end of the month.  Chesley Mires, PharmD, MPH, BCPS, CPP Clinical Pharmacist (Rheumatology and Pulmonology)

## 2023-02-13 ENCOUNTER — Telehealth: Payer: Self-pay | Admitting: *Deleted

## 2023-02-13 NOTE — Telephone Encounter (Signed)
CORRECTION FOR SURGEON'S NAME: DR. Margarita Rana

## 2023-02-13 NOTE — Telephone Encounter (Signed)
   Name: Kari [name]  DOB: 09/15/1950  MRN: [id number]  Primary Cardiologist: Donato Schultz, MD   Preoperative team, please contact this patient and set up a phone call appointment for further preoperative risk assessment. Please obtain consent and complete medication review. Thank you for your help.  I confirm that guidance regarding antiplatelet and oral anticoagulation therapy has been completed and, if necessary, noted below.   Ronney Asters, NP 02/13/2023, 8:38 AM [facility]

## 2023-02-13 NOTE — Telephone Encounter (Signed)
I s/w the pt and she is needing surgery for a broken foot. Ok per Edd Fabian, FNP to add on to 02/17/23 @ 3 pm. Med rec and consent are done.

## 2023-02-13 NOTE — Telephone Encounter (Signed)
I s/w the pt and she is needing surgery for a broken foot. Ok per Edd Fabian, FNP to add on to 02/17/23 @ 3 pm. Med rec and consent are done.     Patient Consent for Virtual Visit        Kari [name] has provided verbal consent on 02/13/2023 for a virtual visit (video or telephone).   CONSENT FOR VIRTUAL VISIT FOR:  Kari [name]  By participating in this virtual visit I agree to the following:  I hereby voluntarily request, consent and authorize [facility] and its employed or contracted physicians, physician assistants, nurse practitioners or other licensed health care professionals (the Practitioner), to provide me with telemedicine health care services (the "Services") as deemed necessary by the treating Practitioner. I acknowledge and consent to receive the Services by the Practitioner via telemedicine. I understand that the telemedicine visit will involve communicating with the Practitioner through live audiovisual communication technology and the disclosure of certain medical information by electronic transmission. I acknowledge that I have been given the opportunity to request an in-person assessment or other available alternative prior to the telemedicine visit and am voluntarily participating in the telemedicine visit.  I understand that I have the right to withhold or withdraw my consent to the use of telemedicine in the course of my care at any time, without affecting my right to future care or treatment, and that the Practitioner or I may terminate the telemedicine visit at any time. I understand that I have the right to inspect all information obtained and/or recorded in the course of the telemedicine visit and may receive copies of available information for a reasonable fee.  I understand that some of the potential risks of receiving the Services via telemedicine include:  Delay or interruption in medical evaluation due to technological equipment failure or  disruption; Information transmitted may not be sufficient (e.g. poor resolution of images) to allow for appropriate medical decision making by the Practitioner; and/or  In rare instances, security protocols could fail, causing a breach of personal health information.  Furthermore, I acknowledge that it is my responsibility to provide information about my medical history, conditions and care that is complete and accurate to the best of my ability. I acknowledge that Practitioner's advice, recommendations, and/or decision may be based on factors not within their control, such as incomplete or inaccurate data provided by me or distortions of diagnostic images or specimens that may result from electronic transmissions. I understand that the practice of medicine is not an exact science and that Practitioner makes no warranties or guarantees regarding treatment outcomes. I acknowledge that a copy of this consent can be made available to me via my patient portal [facility] MyChart), or I can request a printed copy by calling the office of  [facility].    I understand that my insurance will be billed for this visit.   I have read or had this consent read to me. I understand the contents of this consent, which adequately explains the benefits and risks of the Services being provided via telemedicine.  I have been provided ample opportunity to ask questions regarding this consent and the Services and have had my questions answered to my satisfaction. I give my informed consent for the services to be provided through the use of telemedicine in my medical care

## 2023-02-17 ENCOUNTER — Ambulatory Visit: Payer: PPO | Attending: Cardiology

## 2023-02-17 DIAGNOSIS — Z01810 Encounter for preprocedural cardiovascular examination: Secondary | ICD-10-CM | POA: Diagnosis not present

## 2023-02-17 NOTE — Addendum Note (Signed)
Addended by: Ronney Asters on: 02/17/2023 02:56 PM   Modules accepted: Level of Service

## 2023-02-17 NOTE — Progress Notes (Signed)
Virtual Visit via Telephone Note   Because of Kari [name]'s co-morbid illnesses, she is at least at moderate risk for complications without adequate follow up.  This format is felt to be most appropriate for this patient at this time.  The patient did not have access to video technology/had technical difficulties with video requiring transitioning to audio format only (telephone).  All issues noted in this document were discussed and addressed.  No physical exam could be performed with this format.  Please refer to the patient's chart for her consent to telehealth for [facility].  Evaluation Performed:  Preoperative cardiovascular risk assessment _____________   Date:  02/17/2023   Patient ID:  Kari [name], DOB May 30, 1951, MRN [id number] Patient Location:  Home Provider location:   Office  Primary Care Provider:  [facility] [place] Primary Cardiologist:  Donato Schultz, MD  Chief Complaint / Patient Profile   72 y.o. y/o female with a h/o coronary artery disease, hyperlipidemia, hypertension who is pending right ORIF left metatarsal fracture surgery and presents today for telephonic preoperative cardiovascular risk assessment.  History of Present Illness    Kari [name] is a 72 y.o. female who presents via audio/video conferencing for a telehealth visit today.  Pt was last seen in cardiology clinic on 10/24/2022 by Dr. Anne Fu.  At that time Kari [name] was doing well .  The patient is now pending procedure as outlined above. Since her last visit, she continues to be stable from a cardiac standpoint.  Today she denies chest pain, shortness of breath, lower extremity edema, fatigue, palpitations, melena, hematuria, hemoptysis, diaphoresis, weakness, presyncope, syncope, orthopnea, and PND.   Past Medical History    Past Medical History:  Diagnosis Date   Anemia    Arthritis    Class 2 obesity    Complication of anesthesia    when block  attempted ax -turned red in preop-surg delayed   Dizziness    Fibromyalgia    GERD (gastroesophageal reflux disease)    HLD (hyperlipidemia)    Hypertension    Hypothyroidism    Mild to moderate, nonobstructive CAD (coronary artery disease)    MVP (mitral valve prolapse)    Osteoporosis    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   Rheumatoid aortitis    ? arthritis   Stage 3a chronic kidney disease (CKD) (HCC)    Unstable angina (HCC)    cath revealed moderate, nonobstructive CAD.  50% stenosis of mid RCA with 25% stenosis of distal LAD, EF 55 to 65%.   Past Surgical History:  Procedure Laterality Date   ABDOMINAL HYSTERECTOMY     CESAREAN SECTION     CHOLECYSTECTOMY     COLONOSCOPY     JOINT REPLACEMENT     KNEE ARTHROSCOPY Right 2/11   LEFT HEART CATH AND CORONARY ANGIOGRAPHY N/A 03/26/2022   Procedure: LEFT HEART CATH AND CORONARY ANGIOGRAPHY;  Surgeon: Corky Crafts, MD;  Location: [facility] INVASIVE CV LAB;  Service: Cardiovascular;  Laterality: N/A;   TOTAL HIP ARTHROPLASTY Left 02/03/2013   Procedure: TOTAL HIP ARTHROPLASTY;  Surgeon: Nestor Lewandowsky, MD;  Location: [facility] OR;  Service: Orthopedics;  Laterality: Left;   TOTAL HIP ARTHROPLASTY Right 09/22/2020   Dr. Vilinda Flake NERVE TRANSPOSITION  09/17/2011   Procedure: ULNAR NERVE DECOMPRESSION/TRANSPOSITION;  Surgeon: Wyn Forster., MD;  Location: [facility];  Service: Orthopedics;  Laterality: Left;  decompression   WRIST ARTHROSCOPY Right    x2-fusion     WRIST ARTHROSCOPY Left     Allergies  Allergies  Allergen Reactions   Humira [Adalimumab] Itching   Clinoril [Sulindac] Nausea And Vomiting   Erythromycin Nausea And Vomiting   Hydrocodone Nausea Only    "in high doses stomach upset" ok with low doses.    Ibuprofen     Sweats, insomnia   Penicillins Hives   Plaquenil [Hydroxychloroquine]     "Bulls  eye effect"   Sulfa Antibiotics Nausea And Vomiting   Trimethoprim Nausea And Vomiting   Vancomycin Cross Reactors     Flushing-redmans reaction    Home Medications    Prior to Admission medications   Medication Sig Start Date End Date Taking? Authorizing Provider  amLODipine (NORVASC) 2.5 MG tablet Take 1 tablet (2.5 mg total) by mouth daily. 01/16/23   Jake Bathe, MD  aspirin EC 81 MG tablet Take 1 tablet (81 mg total) by mouth daily. Swallow whole. 03/01/22   Jake Bathe, MD  Calcium Citrate-Vitamin D (CALCIUM + D PO) Take 1 tablet by mouth daily.    [provider]  cetirizine (ALLERGY, CETIRIZINE,) 10 MG tablet Take 10 mg by mouth 3 (three) times a week. Tues, Wed, and [employer], Historical, MD  Cholecalciferol (VITAMIN D) 50 MCG (2000 UT) tablet Take 2,000 Units by mouth daily.    [provider]  clindamycin (CLEOCIN) 300 MG capsule Take 600 mg by mouth See admin instructions. Take 600 mg 1 hour prior to dental work Patient not taking: Reported on 02/12/2023    [provider]  diclofenac Sodium (VOLTAREN) 1 % GEL Apply 1 Application topically 4 (four) times daily as needed (pain).    [provider]  eletriptan (RELPAX) 20 MG tablet as needed for migraine or headache. 06/22/19   [provider]  ezetimibe (ZETIA) 10 MG tablet TAKE ONE TABLET BY MOUTH ONCE DAILY 12/20/22   Jake Bathe, MD  fluticasone (FLONASE) 50 MCG/ACT nasal spray Place 1 spray into both nostrils daily for 3 days. 01/21/22   Gustavus Bryant, FNP  gabapentin (NEURONTIN) 300 MG capsule TAKE FOUR CAPSULES BY MOUTH DAILY 01/15/23   Pollyann Savoy, MD  Golimumab (SIMPONI) 50 MG/0.5ML SOAJ Inject 50 mg into the skin every 30 (thirty) days. 01/27/23   Gearldine Bienenstock, PA-C  isosorbide mononitrate (IMDUR) 30 MG 24 hr tablet TAKE ONE TABLET BY MOUTH ONCE DAILY 12/20/22   Jake Bathe, MD  leflunomide (ARAVA) 10 MG tablet Take 1 tablet (10 mg total) by mouth daily.  01/13/23   Gearldine Bienenstock, PA-C  levothyroxine (SYNTHROID) 75 MCG tablet Take 75 mcg by mouth every morning. 07/06/19   [provider]  losartan (COZAAR) 50 MG tablet TAKE ONE TABLET BY MOUTH ONCE DAILY 01/16/23   Jake Bathe, MD  Multiple Vitamin (MULTIVITAMIN WITH MINERALS) TABS Take 1 tablet by mouth daily.    [provider]  nadolol (CORGARD) 40 MG tablet TAKE ONE TABLET BY MOUTH EVERY MORNING Patient not taking: Reported on 02/12/2023 01/16/23   Jake Bathe, MD  Omega-3 Fatty Acids (FISH OIL) 1000 MG CAPS Take by mouth.    [provider]  pantoprazole (PROTONIX) 40 MG tablet Take 40 mg by mouth 2 (two) times daily.     [provider]  rosuvastatin (CRESTOR) 20 MG tablet TAKE ONE TABLET BY MOUTH ONCE DAILY  01/16/23   Jake Bathe, MD  spironolactone (ALDACTONE) 25 MG tablet TAKE ONE TABLET BY MOUTH ONCE DAILY 01/16/23   Jake Bathe, MD  TYRVAYA 0.03 MG/ACT SOLN Place 1 spray into both nostrils 2 (two) times daily as needed (dry eyes). 02/05/21   [provider]    Physical Exam    Vital Signs:  Lindaann Slough does not have vital signs available for review today.  Given telephonic nature of communication, physical exam is limited. AAOx3. NAD. Normal affect.  Speech and respirations are unlabored.  Accessory Clinical Findings    None  Assessment & Plan    1.  Preoperative Cardiovascular Risk Assessment: Right ORIF metatarsal fracture , Dr. Willette Cluster , [phone]       Primary Cardiologist: Donato Schultz, MD  Chart reviewed as part of pre-operative protocol coverage. Given past medical history and time since last visit, based on ACC/AHA guidelines, HAZELLE SERNA would be at acceptable risk for the planned procedure without further cardiovascular testing.   Her RCRI is a class III risk, 6.6% risk of major cardiac event.  She is able to complete greater than 4 METS of physical activity.  Patient was advised that if she develops  new symptoms prior to surgery to contact our office to arrange a follow-up appointment.  She verbalized understanding.  I will route this recommendation to the requesting party via Epic fax function and remove from pre-op pool.  Please call with questions.      Time:   Today, I have spent 5 minutes with the patient with telehealth technology discussing medical history, symptoms, and management plan.  Prior to her phone evaluation I spent greater than 10 minutes reviewing her past medical history and cardiac medications.   Ronney Asters, NP  02/17/2023, 8:00 AM

## 2023-02-21 ENCOUNTER — Telehealth: Payer: Self-pay | Admitting: Pharmacy Technician

## 2023-02-21 NOTE — Telephone Encounter (Addendum)
Auth Submission: APPROVED Site of care: Site of care: [facility] Payer: [employer] Medication & CPT/J Code(s) submitted EVENITY [id number] Route of submission (phone, fax, portal):  Phone #[phone] OPT3 Fax # Auth type: Buy/Bill PB Units/visits requested:x4 doses Reference number: [id number] Approval from: 02/20/23 to 05/21/23

## 2023-02-24 ENCOUNTER — Ambulatory Visit (INDEPENDENT_AMBULATORY_CARE_PROVIDER_SITE_OTHER): Payer: PPO

## 2023-02-24 VITALS — BP 121/71 | HR 64 | Temp 97.4°F | Resp 16 | Ht 62.5 in | Wt 202.0 lb

## 2023-02-24 DIAGNOSIS — M810 Age-related osteoporosis without current pathological fracture: Secondary | ICD-10-CM

## 2023-02-24 DIAGNOSIS — Z79899 Other long term (current) drug therapy: Secondary | ICD-10-CM | POA: Diagnosis not present

## 2023-02-24 MED ORDER — ROMOSOZUMAB-AQQG 105 MG/1.17ML ~~LOC~~ SOSY
210.0000 mg | PREFILLED_SYRINGE | Freq: Once | SUBCUTANEOUS | Status: AC
  Administered 2023-02-24: 210 mg via SUBCUTANEOUS
  Filled 2023-02-24: qty 2.34

## 2023-02-24 NOTE — Progress Notes (Signed)
First Evenity injection scheduled for 02/24/23

## 2023-02-24 NOTE — Progress Notes (Signed)
Diagnosis: Osteoporosis  Provider:  Chilton Greathouse MD  Procedure: Injection  Evenity (Romosozumab-aqqg), Dose: 210 mg, Site: x1 left arm, x1 right arm. subcutaneous, Number of injections: 2  Post Care: Observation period completed  Discharge: Condition: Good, Destination: Home . AVS Declined  Performed by:  Rico Ala, LPN

## 2023-02-27 HISTORY — PX: FOOT SURGERY: SHX648

## 2023-03-24 ENCOUNTER — Ambulatory Visit: Payer: PPO

## 2023-04-02 ENCOUNTER — Encounter: Payer: Self-pay | Admitting: Physician Assistant

## 2023-04-02 ENCOUNTER — Ambulatory Visit: Payer: PPO | Attending: Physician Assistant | Admitting: Physician Assistant

## 2023-04-02 ENCOUNTER — Telehealth: Payer: Self-pay | Admitting: Pharmacist

## 2023-04-02 VITALS — BP 115/88 | HR 85 | Resp 14 | Ht 62.5 in | Wt 200.8 lb

## 2023-04-02 DIAGNOSIS — Z79899 Other long term (current) drug therapy: Secondary | ICD-10-CM

## 2023-04-02 DIAGNOSIS — M810 Age-related osteoporosis without current pathological fracture: Secondary | ICD-10-CM

## 2023-04-02 DIAGNOSIS — M7702 Medial epicondylitis, left elbow: Secondary | ICD-10-CM

## 2023-04-02 DIAGNOSIS — M0579 Rheumatoid arthritis with rheumatoid factor of multiple sites without organ or systems involvement: Secondary | ICD-10-CM

## 2023-04-02 DIAGNOSIS — M7918 Myalgia, other site: Secondary | ICD-10-CM

## 2023-04-02 DIAGNOSIS — Z8669 Personal history of other diseases of the nervous system and sense organs: Secondary | ICD-10-CM

## 2023-04-02 DIAGNOSIS — Z8639 Personal history of other endocrine, nutritional and metabolic disease: Secondary | ICD-10-CM

## 2023-04-02 DIAGNOSIS — Z96643 Presence of artificial hip joint, bilateral: Secondary | ICD-10-CM

## 2023-04-02 DIAGNOSIS — M7701 Medial epicondylitis, right elbow: Secondary | ICD-10-CM | POA: Diagnosis not present

## 2023-04-02 DIAGNOSIS — M51369 Other intervertebral disc degeneration, lumbar region without mention of lumbar back pain or lower extremity pain: Secondary | ICD-10-CM

## 2023-04-02 DIAGNOSIS — E559 Vitamin D deficiency, unspecified: Secondary | ICD-10-CM

## 2023-04-02 DIAGNOSIS — Z8679 Personal history of other diseases of the circulatory system: Secondary | ICD-10-CM

## 2023-04-02 DIAGNOSIS — M79671 Pain in right foot: Secondary | ICD-10-CM

## 2023-04-02 DIAGNOSIS — M5136 Other intervertebral disc degeneration, lumbar region: Secondary | ICD-10-CM

## 2023-04-02 MED ORDER — LEFLUNOMIDE 10 MG PO TABS
10.0000 mg | ORAL_TABLET | Freq: Every day | ORAL | 0 refills | Status: DC
Start: 2023-04-02 — End: 2023-09-09

## 2023-04-02 MED ORDER — SIMPONI 50 MG/0.5ML ~~LOC~~ SOAJ
50.0000 mg | SUBCUTANEOUS | 0 refills | Status: DC
Start: 2023-04-02 — End: 2023-07-01

## 2023-04-02 MED ORDER — GABAPENTIN 300 MG PO CAPS: ORAL_CAPSULE | ORAL | 0 refills | Status: DC

## 2023-04-02 MED ORDER — GABAPENTIN 100 MG PO CAPS: 100.0000 mg | ORAL_CAPSULE | Freq: Every day | ORAL | 0 refills | Status: DC

## 2023-04-02 NOTE — Telephone Encounter (Signed)
Devki, I have sent her name to [employer] for them to reach out to patient for enrollement.  Thanks Selena Batten

## 2023-04-02 NOTE — Telephone Encounter (Signed)
Per Ashok Cordia, CMA: Kari [name] is in the office and received a bill for the evenity for $500. she was told by her insurance company that it will be $500 each month and she said she is not going to pay that. She said the insurance company was billed $8,xxx for the injection   Selena Batten, does Evenity have patient assistance program option?

## 2023-04-02 NOTE — Patient Instructions (Signed)
Standing Labs We placed an order today for your standing lab work.   Please have your standing labs drawn in January and every 3 months   Please have your labs drawn 2 weeks prior to your appointment so that the provider can discuss your lab results at your appointment, if possible.  Please note that you may see your imaging and lab results in MyChart before we have reviewed them. We will contact you once all results are reviewed. Please allow our office up to 72 hours to thoroughly review all of the results before contacting the office for clarification of your results.  WALK-IN LAB HOURS  Monday through Thursday from 8:00 am -12:30 pm and 1:00 pm-5:00 pm and Friday from 8:00 am-12:00 pm.  Patients with office visits requiring labs will be seen before walk-in labs.  You may encounter longer than normal wait times. Please allow additional time. Wait times may be shorter on  Monday and Thursday afternoons.  We do not book appointments for walk-in labs. We appreciate your patience and understanding with our staff.   Labs are drawn by [facility]. Please bring your co-pay at the time of your lab draw.  You may receive a bill from [facility] for your lab work.  Please note if you are on Hydroxychloroquine and and an order has been placed for a Hydroxychloroquine level,  you will need to have it drawn 4 hours or more after your last dose.  If you wish to have your labs drawn at another location, please call the office 24 hours in advance so we can fax the orders.  The office is located at [address], [address], [place], [place] [address]   If you have any questions regarding directions or hours of operation,  please call [phone].   As a reminder, please drink plenty of water prior to coming for your lab work. Thanks!

## 2023-04-02 NOTE — Telephone Encounter (Signed)
Patient notified via MyChart of next steps

## 2023-04-03 LAB — COMPLETE METABOLIC PANEL WITH GFR
AG Ratio: 2.3 (calc) (ref 1.0–2.5)
ALT: 27 U/L (ref 6–29)
AST: 27 U/L (ref 10–35)
Albumin: 4.5 g/dL (ref 3.6–5.1)
Alkaline phosphatase (APISO): 79 U/L (ref 37–153)
BUN/Creatinine Ratio: 16 (calc) (ref 6–22)
BUN: 17 mg/dL (ref 7–25)
CO2: 26 mmol/L (ref 20–32)
Calcium: 9.7 mg/dL (ref 8.6–10.4)
Chloride: 105 mmol/L (ref 98–110)
Creat: 1.08 mg/dL — ABNORMAL HIGH (ref 0.60–1.00)
Globulin: 2 g/dL (ref 1.9–3.7)
Glucose, Bld: 108 mg/dL — ABNORMAL HIGH (ref 65–99)
Potassium: 4 mmol/L (ref 3.5–5.3)
Sodium: 140 mmol/L (ref 135–146)
Total Bilirubin: 0.9 mg/dL (ref 0.2–1.2)
Total Protein: 6.5 g/dL (ref 6.1–8.1)
eGFR: 54 mL/min/{1.73_m2} — ABNORMAL LOW (ref >=60)

## 2023-04-03 LAB — CBC WITH DIFFERENTIAL/PLATELET
Absolute Monocytes: 499 {cells}/uL (ref 200–950)
Basophils Absolute: 82 {cells}/uL (ref 0–200)
Basophils Relative: 1.7 %
Eosinophils Absolute: 230 {cells}/uL (ref 15–500)
Eosinophils Relative: 4.8 %
HCT: 39.4 % (ref 35.0–45.0)
Hemoglobin: 12.9 g/dL (ref 11.7–15.5)
Lymphs Abs: 1790 {cells}/uL (ref 850–3900)
MCH: 32.6 pg (ref 27.0–33.0)
MCHC: 32.7 g/dL (ref 32.0–36.0)
MCV: 99.5 fL (ref 80.0–100.0)
MPV: 9.9 fL (ref 7.5–12.5)
Monocytes Relative: 10.4 %
Neutro Abs: 2198 {cells}/uL (ref 1500–7800)
Neutrophils Relative %: 45.8 %
Platelets: 217 10*3/uL (ref 140–400)
RBC: 3.96 10*6/uL (ref 3.80–5.10)
RDW: 11.9 % (ref 11.0–15.0)
Total Lymphocyte: 37.3 %
WBC: 4.8 10*3/uL (ref 3.8–10.8)

## 2023-04-03 NOTE — Progress Notes (Signed)
CBC WNL Creatinine is elevated-1.07 and GFR is low-54.  Please clarify if she has been taking any NSAIDs? Rest of CMP WNL.

## 2023-04-08 ENCOUNTER — Ambulatory Visit: Payer: PPO

## 2023-04-22 NOTE — Telephone Encounter (Signed)
Email sent to [employer] for update on Evenity PAP

## 2023-04-23 ENCOUNTER — Other Ambulatory Visit (HOSPITAL_COMMUNITY): Payer: Self-pay

## 2023-04-23 NOTE — Telephone Encounter (Signed)
Received notification from [employer] that there is no patient assistance available for Medicare patients for Evenity through [employer] PAP. Unfortunately, unless patient acquires supplemental plan, Evenity doses will be pricey for her.  Patient's treatment history: Previously treated with Fosamax and Forteo (completed 2 years of treatment);  Reclast IV on 06/18/2021.  Options: Prolia, Reclast  Prolia likely also to be several hundred dollars without a supplemental plan.  Chesley Mires, PharmD, MPH, BCPS, CPP Clinical Pharmacist (Rheumatology and Pulmonology)

## 2023-04-24 NOTE — Telephone Encounter (Signed)
Ok to proceed with a repeat IV reclast infusion if the patient is ok with it and labs are up to date.

## 2023-04-29 ENCOUNTER — Telehealth: Payer: Self-pay

## 2023-04-29 ENCOUNTER — Other Ambulatory Visit: Payer: Self-pay

## 2023-04-29 NOTE — Telephone Encounter (Addendum)
Patient agreed to repeat IV reclast infusion. She was advised to come into clinic for repeat labs within the next 2 weeks. She will try to come in this week as she is traveling starting next Monday.   Patient will need CBC, CMP, Vitamin D drawn. Future orders are in place  Sofie Rower, PharmD [employer] PGY-1

## 2023-04-29 NOTE — Telephone Encounter (Signed)
Kari [name], patient will be scheduled as soon as possible.  Auth Submission: NO AUTH NEEDED Site of care: Site of care: [facility] Payer: [employer] Medication & CPT/J Code(s) submitted: Reclast (Zolendronic acid) [id number] Route of submission (phone, fax, portal):  Phone # Fax # Auth type: Buy/Bill PB Units/visits requested: 5mg  x 1 dose Reference number:  Approval from: 04/29/23 to 07/01/23

## 2023-04-30 ENCOUNTER — Other Ambulatory Visit: Payer: Self-pay | Admitting: Physician Assistant

## 2023-04-30 NOTE — Telephone Encounter (Signed)
Last Fill: 04/02/2023  Next Visit: 09/03/2023  Last Visit: 04/02/2023  Dx: Myofascial pain syndrome   Current Dose per office note on 04/02/2023:  gabapentin 300 mg 4 capsules at bedtime. Can try increasing by 100 mg at bedtime   Okay to refill Gabapentin?

## 2023-05-02 NOTE — Progress Notes (Signed)
Reclast scheduled for 05/15/23

## 2023-05-15 ENCOUNTER — Ambulatory Visit: Payer: PPO

## 2023-05-15 VITALS — BP 125/74 | HR 55 | Temp 97.5°F | Resp 16 | Ht 62.75 in | Wt 201.2 lb

## 2023-05-15 DIAGNOSIS — M810 Age-related osteoporosis without current pathological fracture: Secondary | ICD-10-CM | POA: Diagnosis not present

## 2023-05-15 MED ORDER — DIPHENHYDRAMINE HCL 25 MG PO CAPS
25.0000 mg | ORAL_CAPSULE | Freq: Once | ORAL | Status: AC
  Administered 2023-05-15: 25 mg via ORAL
  Filled 2023-05-15: qty 1

## 2023-05-15 MED ORDER — ACETAMINOPHEN 325 MG PO TABS
650.0000 mg | ORAL_TABLET | Freq: Once | ORAL | Status: AC
  Administered 2023-05-15: 650 mg via ORAL
  Filled 2023-05-15: qty 2

## 2023-05-15 MED ORDER — ZOLEDRONIC ACID 5 MG/100ML IV SOLN
5.0000 mg | Freq: Once | INTRAVENOUS | Status: AC
  Administered 2023-05-15: 5 mg via INTRAVENOUS
  Filled 2023-05-15: qty 100

## 2023-05-15 NOTE — Progress Notes (Signed)
Diagnosis: Osteoporosis  Provider:  Chilton Greathouse MD  Procedure: IV Infusion  IV Type: Peripheral, IV Location: L Hand  Reclast (Zolendronic Acid), Dose: 5 mg  Infusion Start Time: 1105  Infusion Stop Time: 1135  Post Infusion IV Care: Peripheral IV Discontinued  Discharge: Condition: Good, Destination: Home . AVS Declined  Performed by:  Loney Hering, LPN

## 2023-05-26 ENCOUNTER — Other Ambulatory Visit: Payer: Self-pay | Admitting: Physician Assistant

## 2023-05-26 NOTE — Telephone Encounter (Signed)
Last Fill: 04/30/2023  Next Visit: 09/03/2023  Last Visit: 04/02/2023   Dx: Myofascial pain syndrome   Current Dose per office note on 04/02/2023: gabapentin 300 mg 4 capsules at bedtime. Can try increasing by 100 mg at bedtime   Okay to refill Gabapentin?

## 2023-06-23 ENCOUNTER — Other Ambulatory Visit: Payer: Self-pay | Admitting: Physician Assistant

## 2023-06-23 NOTE — Telephone Encounter (Signed)
Last Fill: 05/26/2023  Next Visit: 09/03/2023  Last Visit: 04/02/2023  Dx: Myofascial pain syndrome   Current Dose per office note on 04/02/2023: gabapentin 300 mg 4 capsules at bedtime. Can try increasing by 100 mg at bedtime   Okay to refill Gabapentin?

## 2023-07-01 ENCOUNTER — Other Ambulatory Visit: Payer: Self-pay | Admitting: Physician Assistant

## 2023-07-01 DIAGNOSIS — M0579 Rheumatoid arthritis with rheumatoid factor of multiple sites without organ or systems involvement: Secondary | ICD-10-CM

## 2023-07-01 DIAGNOSIS — Z79899 Other long term (current) drug therapy: Secondary | ICD-10-CM

## 2023-07-01 NOTE — Telephone Encounter (Signed)
 Last Fill: 04/02/2023  Labs: 04/02/2023 CBC WNL Creatinine is elevated-1.07 and GFR is low-54.Rest of CMP WNL.   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 Patient plans to come to the office on Thursday to update.   Okay to refill Simponi ?

## 2023-07-03 ENCOUNTER — Other Ambulatory Visit: Payer: Self-pay | Admitting: *Deleted

## 2023-07-03 DIAGNOSIS — Z79899 Other long term (current) drug therapy: Secondary | ICD-10-CM

## 2023-07-03 DIAGNOSIS — Z131 Encounter for screening for diabetes mellitus: Secondary | ICD-10-CM

## 2023-07-03 DIAGNOSIS — Z8639 Personal history of other endocrine, nutritional and metabolic disease: Secondary | ICD-10-CM

## 2023-07-03 DIAGNOSIS — E559 Vitamin D deficiency, unspecified: Secondary | ICD-10-CM

## 2023-07-03 DIAGNOSIS — M810 Age-related osteoporosis without current pathological fracture: Secondary | ICD-10-CM

## 2023-07-03 NOTE — Progress Notes (Signed)
 Hemoglobin is low at 10.5.  Patient notified patient.  She should see her PCP for the evaluation of anemia.  Please add B12 and folate if possible.

## 2023-07-04 LAB — HEMOGLOBIN A1C
Hgb A1c MFr Bld: 4.8 %{Hb} (ref <5.7)
Mean Plasma Glucose: 91 mg/dL
eAG (mmol/L): 5 mmol/L

## 2023-07-04 LAB — COMPLETE METABOLIC PANEL WITH GFR
AG Ratio: 2.2 (calc) (ref 1.0–2.5)
ALT: 20 U/L (ref 6–29)
AST: 23 U/L (ref 10–35)
Albumin: 4.3 g/dL (ref 3.6–5.1)
Alkaline phosphatase (APISO): 63 U/L (ref 37–153)
BUN/Creatinine Ratio: 12 (calc) (ref 6–22)
BUN: 13 mg/dL (ref 7–25)
CO2: 25 mmol/L (ref 20–32)
Calcium: 9.2 mg/dL (ref 8.6–10.4)
Chloride: 108 mmol/L (ref 98–110)
Creat: 1.12 mg/dL — ABNORMAL HIGH (ref 0.60–1.00)
Globulin: 2 g/dL (ref 1.9–3.7)
Glucose, Bld: 109 mg/dL — ABNORMAL HIGH (ref 65–99)
Potassium: 3.4 mmol/L — ABNORMAL LOW (ref 3.5–5.3)
Sodium: 141 mmol/L (ref 135–146)
Total Bilirubin: 0.6 mg/dL (ref 0.2–1.2)
Total Protein: 6.3 g/dL (ref 6.1–8.1)
eGFR: 52 mL/min/{1.73_m2} — ABNORMAL LOW (ref >=60)

## 2023-07-04 LAB — CBC WITH DIFFERENTIAL/PLATELET
Absolute Lymphocytes: 1434 {cells}/uL (ref 850–3900)
Absolute Monocytes: 414 {cells}/uL (ref 200–950)
Basophils Absolute: 101 {cells}/uL (ref 0–200)
Basophils Relative: 2.3 %
Eosinophils Absolute: 484 {cells}/uL (ref 15–500)
Eosinophils Relative: 11 %
HCT: 32.4 % — ABNORMAL LOW (ref 35.0–45.0)
Hemoglobin: 10.5 g/dL — ABNORMAL LOW (ref 11.7–15.5)
MCH: 32.7 pg (ref 27.0–33.0)
MCHC: 32.4 g/dL (ref 32.0–36.0)
MCV: 100.9 fL — ABNORMAL HIGH (ref 80.0–100.0)
MPV: 10.6 fL (ref 7.5–12.5)
Monocytes Relative: 9.4 %
Neutro Abs: 1967 {cells}/uL (ref 1500–7800)
Neutrophils Relative %: 44.7 %
Platelets: 182 10*3/uL (ref 140–400)
RBC: 3.21 10*6/uL — ABNORMAL LOW (ref 3.80–5.10)
RDW: 12.7 % (ref 11.0–15.0)
Total Lymphocyte: 32.6 %
WBC: 4.4 10*3/uL (ref 3.8–10.8)

## 2023-07-04 LAB — B12 AND FOLATE PANEL
Folate: >24 ng/mL
Vitamin B-12: 479 pg/mL (ref 200–1100)

## 2023-07-04 LAB — TEST AUTHORIZATION

## 2023-07-04 LAB — VITAMIN D 25 HYDROXY (VIT D DEFICIENCY, FRACTURES): Vit D, 25-Hydroxy: 79 ng/mL (ref 30–100)

## 2023-07-04 LAB — LIPID PANEL
Cholesterol: 119 mg/dL (ref <200)
HDL: 50 mg/dL (ref >=50)
LDL Cholesterol (Calc): 51 mg/dL
Non-HDL Cholesterol (Calc): 69 mg/dL (ref <130)
Total CHOL/HDL Ratio: 2.4 (calc) (ref <5.0)
Triglycerides: 101 mg/dL (ref <150)

## 2023-07-04 NOTE — Progress Notes (Signed)
 Vitamin D WNL.

## 2023-07-04 NOTE — Progress Notes (Signed)
 Creatinine is mildly elevated and potassium is low.  Hemoglobin A1c is in the desirable range.  LDL is normal.   Please forward results to her PCP.

## 2023-07-14 ENCOUNTER — Other Ambulatory Visit: Payer: Self-pay | Admitting: *Deleted

## 2023-07-14 MED ORDER — GABAPENTIN 300 MG PO CAPS: ORAL_CAPSULE | ORAL | 0 refills | Status: DC

## 2023-07-14 NOTE — Telephone Encounter (Signed)
 Last Fill: 04/02/2023  Next Visit: 09/03/2023  Last Visit: 04/02/2023  Dx: Myofascial pain syndrome   Current Dose per office note on 04/02/2023:  gabapentin 300 mg 4 capsules at bedtime. Can try increasing by 100 mg at bedtime   Okay to refill Gabapentin?

## 2023-08-01 ENCOUNTER — Other Ambulatory Visit: Payer: Self-pay | Admitting: Physician Assistant

## 2023-08-01 DIAGNOSIS — Z79899 Other long term (current) drug therapy: Secondary | ICD-10-CM

## 2023-08-01 DIAGNOSIS — M0579 Rheumatoid arthritis with rheumatoid factor of multiple sites without organ or systems involvement: Secondary | ICD-10-CM

## 2023-08-04 ENCOUNTER — Telehealth: Payer: Self-pay | Admitting: Rheumatology

## 2023-08-04 NOTE — Telephone Encounter (Signed)
Morrie Sheldon, pharmacist at [facility] left a voicemail regarding a prescription denial for Simponi that we received for the patient. We have not received a new prescription and are checking if the directions have changed and it is no longer every 30 days.  We have no refills remaining and the patient is due to receive the medication.  Please call back at #[phone]

## 2023-08-04 NOTE — Telephone Encounter (Signed)
Returned call to [employer] and spoke with Boykin. Advised him that we sent a prescription over to them on 07/01/2023 with 2 refills. Cory found prescription and states it was deactivated. Cory reactivated the prescription and will work on getting it out to the patient.

## 2023-08-20 NOTE — Progress Notes (Signed)
 Office Visit Note  Patient: Kari [name]             Date of Birth: 21-Sep-1950           MRN: [id number]             PCP: Carin Hock, PA Referring: Darrin Nipper Family M* Visit Date: 09/03/2023 Occupation: @GUAROCC @  Subjective:  Medication management  History of Present Illness: DOMINI VANDEHEI is a 73 y.o. female with seropositive rheumatoid arthritis, degenerative disc disease and osteoporosis.  She started Mesic on February 24, 2023 but discontinued due to the high co-pay.  She had IV Reclast on May 15, 2023.  No side effects from IV Reclast.  She continues to be on Arava 10 mg p.o. daily.  She has been on Simponi 50 mg subcu monthly since January 27, 2023.  Enbrel was discontinued due to insurance coverage.  Nuys any joint pain or joint swelling.  Her bilateral hip replacements are doing well.  She continues to have some generalized pain and discomfort from myofascial pain syndrome.  She has flares sometimes in her rib cage..  She is on gabapentin 300 mg, 4 tablets p.o. nightly.  She also added 100 mg extra dose at the last visit.  She has not noticed any improvement with that.  She states she had a recent fall but did not injure anything.    Activities of Daily Living:  Patient reports morning stiffness for few minutes.   Patient Reports nocturnal pain.  Difficulty dressing/grooming: Denies Difficulty climbing stairs: Reports Difficulty getting out of chair: Denies Difficulty using hands for taps, buttons, cutlery, and/or writing: Reports  Review of Systems  Constitutional:  Positive for fatigue.  HENT:  Positive for mouth sores and mouth dryness.   Eyes:  Positive for dryness.  Respiratory:  Negative for shortness of breath.   Cardiovascular:  Negative for chest pain and palpitations.  Gastrointestinal:  Negative for blood in stool, constipation and diarrhea.  Endocrine: Negative for increased urination.  Genitourinary:  Negative for involuntary urination.   Musculoskeletal:  Positive for gait problem, myalgias, muscle weakness, morning stiffness, muscle tenderness and myalgias. Negative for joint pain, joint pain and joint swelling.  Skin:  Positive for sensitivity to sunlight. Negative for color change, rash and hair loss.  Allergic/Immunologic: Negative for susceptible to infections.  Neurological:  Positive for dizziness and headaches.  Hematological:  Negative for swollen glands.  Psychiatric/Behavioral:  Negative for depressed mood and sleep disturbance. The patient is not nervous/anxious.     PMFS History:  Patient Active Problem List   Diagnosis Date Noted   Moderate, nonobstructive CAD (coronary artery disease) 04/11/2022   Dizziness with bending over 04/11/2022   Stage 3a chronic kidney disease (CKD) (HCC) 04/11/2022   Anemia 04/11/2022   Class 2 obesity 04/11/2022   Precordial chest pain    Rheumatoid arthritis with rheumatoid factor of multiple sites without organ or systems involvement (HCC) 10/17/2020   High risk medication use 10/17/2020   History of total replacement of both hip joints 10/17/2020   DDD (degenerative disc disease), lumbar 10/17/2020   Myofascial pain syndrome 10/17/2020   Age-related osteoporosis without current pathological fracture 10/17/2020   Frequent falls 10/17/2020   Hyperlipidemia 07/14/2015   Palpitation 05/11/2014   PVC (premature ventricular contraction) 05/11/2014   Hypokalemia 05/11/2014   Essential hypertension 05/11/2014   Mitral regurgitation 05/11/2014   Arthritis, hip 02/03/2013    Past Medical History:  Diagnosis Date   Anemia  Arthritis    Class 2 obesity    Complication of anesthesia    when block attempted ax -turned red in preop-surg delayed   Dizziness    Fibromyalgia    GERD (gastroesophageal reflux disease)    HLD (hyperlipidemia)    Hypertension    Hypothyroidism    Mild to moderate, nonobstructive CAD (coronary artery disease)    MVP (mitral valve prolapse)     Osteoporosis    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   Rheumatoid aortitis    ? arthritis   Stage 3a chronic kidney disease (CKD) (HCC)    Unstable angina (HCC)    cath revealed moderate, nonobstructive CAD.  50% stenosis of mid RCA with 25% stenosis of distal LAD, EF 55 to 65%.    Family History  Problem Relation Age of Onset   Heart attack Father    Heart disease Mother    Stroke Mother    Colon cancer Other    Colon polyps Other    Hypothyroidism Son    Past Surgical History:  Procedure Laterality Date   ABDOMINAL HYSTERECTOMY     CESAREAN SECTION     CHOLECYSTECTOMY     COLONOSCOPY     FOOT SURGERY Right 02/27/2023   1 pin in 5th metatarsal   FOOT SURGERY Left    JOINT REPLACEMENT     KNEE ARTHROSCOPY Right 08/2009   LEFT HEART CATH AND CORONARY ANGIOGRAPHY N/A 03/26/2022   Procedure: LEFT HEART CATH AND CORONARY ANGIOGRAPHY;  Surgeon: Corky Crafts, MD;  Location: [facility];  Service: Cardiovascular;  Laterality: N/A;   TOTAL HIP ARTHROPLASTY Left 02/03/2013   Procedure: TOTAL HIP ARTHROPLASTY;  Surgeon: Nestor Lewandowsky, MD;  Location: [facility] OR;  Service: Orthopedics;  Laterality: Left;   TOTAL HIP ARTHROPLASTY Right 09/22/2020   Dr. Vilinda Flake NERVE TRANSPOSITION  09/17/2011   Procedure: ULNAR NERVE DECOMPRESSION/TRANSPOSITION;  Surgeon: Wyn Forster., MD;  Location: [facility];  Service: Orthopedics;  Laterality: Left;  decompression   WRIST ARTHROSCOPY Right    x2-fusion     WRIST ARTHROSCOPY Left    Social History   Social History Narrative   Not on file   Immunization History  Administered Date(s) Administered   PFIZER(Purple Top)SARS-COV-2 Vaccination 08/05/2019, 08/26/2019, 02/14/2020, 01/16/2021     Objective: Vital Signs: BP 121/77 (BP Location: Left Arm, Patient Position: Sitting, Cuff Size: Large)   Pulse (!) 57   Resp  13   Ht 5' 2.5" (1.588 m)   Wt 197 lb (89.4 kg)   BMI 35.46 kg/m    Physical Exam Vitals and nursing note reviewed.  Constitutional:      Appearance: She is well-developed.  HENT:     Head: Normocephalic and atraumatic.  Eyes:     Conjunctiva/sclera: Conjunctivae normal.  Cardiovascular:     Rate and Rhythm: Normal rate and regular rhythm.     Heart sounds: Normal heart sounds.  Pulmonary:     Effort: Pulmonary effort is normal.     Breath sounds: Normal breath sounds.  Abdominal:     General: Bowel sounds are normal.     Palpations: Abdomen is soft.  Musculoskeletal:     Cervical back: Normal range of motion.  Lymphadenopathy:     Cervical: No cervical adenopathy.  Skin:    General: Skin is warm and dry.     Capillary Refill:  Capillary refill takes less than 2 seconds.  Neurological:     Mental Status: She is alert and oriented to person, place, and time.  Psychiatric:        Behavior: Behavior normal.      Musculoskeletal Exam: She had good range of motion of the cervical spine.  Thoracic kyphosis was noted.  There was no tenderness over thoracic or lumbar spine.  She had good range of motion of the shoulders and elbow joints.  Right wrist joint was fused due to previous surgery.  She had limited range of motion of her left wrist joint.  No synovitis was noted over wrist joints or MCPs.  No PIP or DIP thickening was noted.  Hip joints were replaced and were in good range of motion.  Knee joints were in good range of motion without any warmth swelling or effusion.  There was no tenderness over ankles or MTPs.  CDAI Exam: CDAI Score: -- Patient Global: --; Provider Global: -- Swollen: --; Tender: -- Joint Exam 09/03/2023   No joint exam has been documented for this visit   There is currently no information documented on the homunculus. Go to the Rheumatology activity and complete the homunculus joint exam.  Investigation: No additional findings.  Imaging: No  results found.  Recent Labs: Lab Results  Component Value Date   WBC 4.4 07/03/2023   HGB 10.5 (L) 07/03/2023   PLT 182 07/03/2023   NA 141 07/03/2023   K 3.4 (L) 07/03/2023   CL 108 07/03/2023   CO2 25 07/03/2023   GLUCOSE 109 (H) 07/03/2023   BUN 13 07/03/2023   CREATININE 1.12 (H) 07/03/2023   BILITOT 0.6 07/03/2023   ALKPHOS 72 01/20/2017   AST 23 07/03/2023   ALT 20 07/03/2023   PROT 6.3 07/03/2023   ALBUMIN 4.2 01/20/2017   CALCIUM 9.2 07/03/2023   GFRAA 72 12/04/2020   QFTBGOLDPLUS NEGATIVE 12/11/2022    Speciality Comments: Enbrel - stopped Jan 2024 d/t losing PAP coverage  Simponi started 01/27/23  Procedures:  No procedures performed Allergies: Humira [adalimumab], Clinoril [sulindac], Erythromycin, Hydrocodone, Ibuprofen, Lisinopril, Oxycodone, Penicillins, Plaquenil [hydroxychloroquine], Sulfa antibiotics, Tramadol hcl, Trimethoprim, and Vancomycin cross reactors   Assessment / Plan:     Visit Diagnoses: Rheumatoid arthritis with rheumatoid factor of multiple sites without organ or systems involvement (HCC) - Patient was initiated on Simponi 50 mg sq injections on 01/27/2023.  Discontinued enbrel due to no longer qualifying for patient assistance.  Patient states she has been doing well on the combination of Simponi and Reyvow.  She has not had any flares.  No synovitis was noted on the examination today.  High risk medication use - Simponi 50 mg sq injections once monthly--started on 01/27/23. Arava 10 mg 1 tablet by mouth daily.  July 03, 2023 hemoglobin was low at 10.5.  Creatinine was elevated at 1.12.  Patient has an appointment coming up with the nephrologist.  TB Gold was negative on December 11, 2022.  Her next labs will be due in April and every 3 months.  Information on immunization was placed in the AVS.  She was advised to hold Simponi and Arava if she develops an infection and resume after the infection resolves.  Annual skin examination to screen for skin  cancer was advised.  Use of sunscreen and sun protection was discussed.  Medial epicondylitis of both elbows-improved.  History of total replacement of both hip joints-she had good motion without discomfort.  Pain in right foot -she  denies any discomfort in her right foot today.  X-rays of the right foot were updated on 02/12/2023-acute fracture at the base of the right fifth metatarsal noted.Surgically repaired by Dr. Eulah Pont 02/27/2023.  Degeneration of intervertebral disc of lumbar region without discogenic back pain or lower extremity pain-she has intermittent discomfort in her lower back.  Myofascial pain syndrome-she could history of some generalized pain and discomfort.  Need for regular exercise was emphasized.  She has been taking gabapentin 1300 mg at bedtime.  Patient gives history of rib cage pain and also lower extremity discomfort at night.  I did detailed discussion regarding the gabapentin with the patient.  Side effects including increased risk of dizziness, drowsiness, falls, weight gain and osteoporosis were discussed.  She will reduce gabapentin to 1200 mg p.o. at bedtime for a week and then she will start decreasing by 300 mg every month as tolerated.  Ideal dose will be only 300 mg at bedtime.  Patient was in agreement.  Age-related osteoporosis without current pathological fracture - DEXA: 02/21/2022 T-score -1.8, BMD 0.586 left one third radius which showed -6% change from August 2021.  AP total spine T-score +0.7, 5% improvement.  She took Evenity subcutaneous injection in August 2024 which was discontinued due to the cost.  She had IV Reclast in November 2024.  She will have repeat DEXA scan in August 2025.  Encounter for medication monitoring - Previously treated with fosamax and forteo (completed 2 years of treatment). Reclast IV on 06/18/2021.Patient initiated evenity on 02/24/23.  Vitamin D deficiency-vitamin D was 8 on July 03, 2023.  History of hypertension-blood  pressure was normal today at 121/77.  History of hypothyroidism  History of neuropathy - gabapentin 300 mg 4 capsules at bedtime.  She also was given gabapentin 100 mg at bedtime at the last visit.  After the GI discussion she will reduce the dose of gabapentin as described above.  Patient was in agreement.  History of hyperlipidemia  History of recent fall-I offered for sickle therapy which she declined.  Patient states she recently finished physical therapy.  Orders: No orders of the defined types were placed in this encounter.  No orders of the defined types were placed in this encounter.    Follow-Up Instructions: Return in about 5 months (around 02/03/2024) for Rheumatoid arthritis, Osteoarthritis.   Pollyann Savoy, MD  Note - This record has been created using Animal nutritionist.  Chart creation errors have been sought, but may not always  have been located. Such creation errors do not reflect on  the standard of medical care.

## 2023-09-03 ENCOUNTER — Encounter: Payer: Self-pay | Admitting: Rheumatology

## 2023-09-03 ENCOUNTER — Ambulatory Visit: Payer: PPO | Attending: Rheumatology | Admitting: Rheumatology

## 2023-09-03 VITALS — BP 121/77 | HR 57 | Resp 13 | Ht 62.5 in | Wt 197.0 lb

## 2023-09-03 DIAGNOSIS — M7701 Medial epicondylitis, right elbow: Secondary | ICD-10-CM | POA: Diagnosis not present

## 2023-09-03 DIAGNOSIS — Z79899 Other long term (current) drug therapy: Secondary | ICD-10-CM | POA: Diagnosis not present

## 2023-09-03 DIAGNOSIS — Z5181 Encounter for therapeutic drug level monitoring: Secondary | ICD-10-CM

## 2023-09-03 DIAGNOSIS — M0579 Rheumatoid arthritis with rheumatoid factor of multiple sites without organ or systems involvement: Secondary | ICD-10-CM | POA: Diagnosis not present

## 2023-09-03 DIAGNOSIS — M79671 Pain in right foot: Secondary | ICD-10-CM

## 2023-09-03 DIAGNOSIS — E559 Vitamin D deficiency, unspecified: Secondary | ICD-10-CM

## 2023-09-03 DIAGNOSIS — Z96643 Presence of artificial hip joint, bilateral: Secondary | ICD-10-CM | POA: Diagnosis not present

## 2023-09-03 DIAGNOSIS — M7918 Myalgia, other site: Secondary | ICD-10-CM

## 2023-09-03 DIAGNOSIS — M51369 Other intervertebral disc degeneration, lumbar region without mention of lumbar back pain or lower extremity pain: Secondary | ICD-10-CM

## 2023-09-03 DIAGNOSIS — Z8679 Personal history of other diseases of the circulatory system: Secondary | ICD-10-CM

## 2023-09-03 DIAGNOSIS — Z9181 History of falling: Secondary | ICD-10-CM

## 2023-09-03 DIAGNOSIS — M7702 Medial epicondylitis, left elbow: Secondary | ICD-10-CM

## 2023-09-03 DIAGNOSIS — Z8669 Personal history of other diseases of the nervous system and sense organs: Secondary | ICD-10-CM

## 2023-09-03 DIAGNOSIS — M810 Age-related osteoporosis without current pathological fracture: Secondary | ICD-10-CM

## 2023-09-03 DIAGNOSIS — Z8639 Personal history of other endocrine, nutritional and metabolic disease: Secondary | ICD-10-CM

## 2023-09-03 NOTE — Patient Instructions (Addendum)
 Standing Labs We placed an order today for your standing lab work.   Please have your standing labs drawn in April and every 3 months  Please have your labs drawn 2 weeks prior to your appointment so that the provider can discuss your lab results at your appointment, if possible.  Please note that you may see your imaging and lab results in MyChart before we have reviewed them. We will contact you once all results are reviewed. Please allow our office up to 72 hours to thoroughly review all of the results before contacting the office for clarification of your results.  WALK-IN LAB HOURS  Monday through Thursday from 8:00 am -12:30 pm and 1:00 pm-5:00 pm and Friday from 8:00 am-12:00 pm.  Patients with office visits requiring labs will be seen before walk-in labs.  You may encounter longer than normal wait times. Please allow additional time. Wait times may be shorter on  Monday and Thursday afternoons.  We do not book appointments for walk-in labs. We appreciate your patience and understanding with our staff.   Labs are drawn by [facility]. Please bring your co-pay at the time of your lab draw.  You may receive a bill from [facility] for your lab work.  Please note if you are on Hydroxychloroquine and and an order has been placed for a Hydroxychloroquine level,  you will need to have it drawn 4 hours or more after your last dose.  If you wish to have your labs drawn at another location, please call the office 24 hours in advance so we can fax the orders.  The office is located at [address], [address], [place], [place] [address]   If you have any questions regarding directions or hours of operation,  please call [phone].   As a reminder, please drink plenty of water prior to coming for your lab work. Thanks!   Vaccines You are taking a medication(s) that can suppress your immune system.  The following immunizations are recommended: Flu annually Covid-19  RSV Td/Tdap (tetanus,  diphtheria, pertussis) every 10 years Pneumonia (Prevnar 15 then Pneumovax 23 at least 1 year apart.  Alternatively, can take Prevnar 20 without needing additional dose) Shingrix: 2 doses from 4 weeks to 6 months apart  Please check with your PCP to make sure you are up to date.   If you have signs or symptoms of an infection or start antibiotics: First, call your PCP for workup of your infection. Hold your medication through the infection, until you complete your antibiotics, and until symptoms resolve if you take the following: Injectable medication (Actemra, Benlysta, Cimzia, Cosentyx, Enbrel, Humira, Kevzara, Orencia, Remicade, Simponi, Stelara, Taltz, Tremfya) Methotrexate Leflunomide (Arava) Mycophenolate (Cellcept) Harriette Ohara, Olumiant, or Rinvoq   Please get an annual skin examination to screen for skin cancer while you are on Simponi.  Please use sunscreen and sun protection on a regular basis.

## 2023-09-08 ENCOUNTER — Other Ambulatory Visit: Payer: Self-pay | Admitting: Physician Assistant

## 2023-09-08 DIAGNOSIS — M0579 Rheumatoid arthritis with rheumatoid factor of multiple sites without organ or systems involvement: Secondary | ICD-10-CM

## 2023-09-09 NOTE — Telephone Encounter (Signed)
 Last Fill: 04/02/2023  Labs: 07/03/2023 Creatinine is mildly elevated and potassium is low. Hemoglobin is low at 10.5.   Next Visit: 02/03/2024  Last Visit: 09/03/2023  DX: Rheumatoid arthritis with rheumatoid factor of multiple sites without organ or systems involvement   Current Dose per office note 09/03/2023: Arava 10 mg 1 tablet by mouth daily   Okay to refill Arava ?

## 2023-10-03 ENCOUNTER — Other Ambulatory Visit: Payer: Self-pay | Admitting: *Deleted

## 2023-10-03 DIAGNOSIS — Z79899 Other long term (current) drug therapy: Secondary | ICD-10-CM

## 2023-10-04 LAB — COMPREHENSIVE METABOLIC PANEL WITH GFR
AG Ratio: 2.2 (calc) (ref 1.0–2.5)
ALT: 30 U/L — ABNORMAL HIGH (ref 6–29)
AST: 31 U/L (ref 10–35)
Albumin: 4.4 g/dL (ref 3.6–5.1)
Alkaline phosphatase (APISO): 52 U/L (ref 37–153)
BUN/Creatinine Ratio: 14 (calc) (ref 6–22)
BUN: 15 mg/dL (ref 7–25)
CO2: 29 mmol/L (ref 20–32)
Calcium: 9.4 mg/dL (ref 8.6–10.4)
Chloride: 104 mmol/L (ref 98–110)
Creat: 1.08 mg/dL — ABNORMAL HIGH (ref 0.60–1.00)
Globulin: 2 g/dL (ref 1.9–3.7)
Glucose, Bld: 98 mg/dL (ref 65–99)
Potassium: 4.2 mmol/L (ref 3.5–5.3)
Sodium: 139 mmol/L (ref 135–146)
Total Bilirubin: 1 mg/dL (ref 0.2–1.2)
Total Protein: 6.4 g/dL (ref 6.1–8.1)
eGFR: 54 mL/min/{1.73_m2} — ABNORMAL LOW (ref >=60)

## 2023-10-04 LAB — CBC WITH DIFFERENTIAL/PLATELET
Absolute Lymphocytes: 1974 {cells}/uL (ref 850–3900)
Absolute Monocytes: 666 {cells}/uL (ref 200–950)
Basophils Absolute: 78 {cells}/uL (ref 0–200)
Basophils Relative: 1.3 %
Eosinophils Absolute: 414 {cells}/uL (ref 15–500)
Eosinophils Relative: 6.9 %
HCT: 36.5 % (ref 35.0–45.0)
Hemoglobin: 12.2 g/dL (ref 11.7–15.5)
MCH: 33.2 pg — ABNORMAL HIGH (ref 27.0–33.0)
MCHC: 33.4 g/dL (ref 32.0–36.0)
MCV: 99.5 fL (ref 80.0–100.0)
MPV: 9.9 fL (ref 7.5–12.5)
Monocytes Relative: 11.1 %
Neutro Abs: 2868 {cells}/uL (ref 1500–7800)
Neutrophils Relative %: 47.8 %
Platelets: 211 10*3/uL (ref 140–400)
RBC: 3.67 10*6/uL — ABNORMAL LOW (ref 3.80–5.10)
RDW: 12 % (ref 11.0–15.0)
Total Lymphocyte: 32.9 %
WBC: 6 10*3/uL (ref 3.8–10.8)

## 2023-10-05 NOTE — Progress Notes (Signed)
 Creatinine is mildly elevated and stable.  Liver functions are mildly elevated probably due to the combination of leflunomide and statin use.  Patient should avoid NSAIDs and alcohol use.  CBC is stable.

## 2023-10-20 ENCOUNTER — Encounter: Payer: Self-pay | Admitting: Cardiology

## 2023-10-20 ENCOUNTER — Ambulatory Visit: Payer: PPO | Attending: Cardiology | Admitting: Cardiology

## 2023-10-20 VITALS — BP 132/80 | HR 61 | Ht 63.0 in | Wt 196.0 lb

## 2023-10-20 DIAGNOSIS — I2510 Atherosclerotic heart disease of native coronary artery without angina pectoris: Secondary | ICD-10-CM

## 2023-10-20 DIAGNOSIS — I10 Essential (primary) hypertension: Secondary | ICD-10-CM | POA: Diagnosis not present

## 2023-10-20 DIAGNOSIS — N1831 Chronic kidney disease, stage 3a: Secondary | ICD-10-CM | POA: Diagnosis not present

## 2023-10-20 DIAGNOSIS — I493 Ventricular premature depolarization: Secondary | ICD-10-CM | POA: Diagnosis not present

## 2023-10-20 DIAGNOSIS — E785 Hyperlipidemia, unspecified: Secondary | ICD-10-CM

## 2023-10-20 DIAGNOSIS — M0579 Rheumatoid arthritis with rheumatoid factor of multiple sites without organ or systems involvement: Secondary | ICD-10-CM

## 2023-10-20 NOTE — Patient Instructions (Signed)
 Medication Instructions:  The current medical regimen is effective;  continue present plan and medications.  *If you need a refill on your cardiac medications before your next appointment, please call your pharmacy*  Follow-Up: At [facility], you and your health needs are our priority.  As part of our continuing mission to provide you with exceptional heart care, our providers are all part of one team.  This team includes your primary Cardiologist (physician) and Advanced Practice Providers or APPs (Physician Assistants and Nurse Practitioners) who all work together to provide you with the care you need, when you need it.  Your next appointment:   1 year(s)  Provider:   Lovette Rud, PA-C, Charles Connor, NP, Theotis Flake, PA-C, Marlyse Single, PA-C, Leala Prince, PA-C, or Xika Zhao, NP     Then, Dorothye Gathers, MD will plan to see you again in 2 year(s).    We recommend signing up for the patient portal called "MyChart".  Sign up information is provided on this After Visit Summary.  MyChart is used to connect with patients for Virtual Visits (Telemedicine).  Patients are able to view lab/test results, encounter notes, upcoming appointments, etc.  Non-urgent messages can be sent to your provider as well.   To learn more about what you can do with MyChart, go to [email or web address].      1st Floor: - Lobby - Registration  - Pharmacy  - Lab - Cafe  2nd Floor: - PV Lab - Diagnostic Testing (echo, CT, nuclear med)  3rd Floor: - Vacant  4th Floor: - TCTS (cardiothoracic surgery) - AFib Clinic - Structural Heart Clinic - Vascular Surgery  - Vascular Ultrasound  5th Floor: - HeartCare Cardiology (general and EP) - Clinical Pharmacy for coumadin, hypertension, lipid, weight-loss medications, and med management appointments    Valet parking services will be available as well.

## 2023-10-20 NOTE — Progress Notes (Signed)
 Cardiology Office Note:  .   Date:  10/20/2023  ID:  Kari [name], DOB 12-08-1950, MRN [id number] PCP: Dickey Fought, PA  [facility] Providers Cardiologist:  Dorothye Gathers, MD     History of Present Illness: Kari [name]   Kari [name] is a 73 y.o. female Discussed the use of AI scribe software for clinical note transcription with the patient, who gave verbal consent to proceed.  History of Present Illness Kari [name] is a 73 year old female with coronary artery disease who presents for follow-up.  She has a history of nonobstructive coronary artery disease confirmed by cardiac catheterization on March 20, 2022, following a coronary calcium  score of 867 on February 28, 2022. She is compliant with her medications, including Zetia  10 mg daily and rosuvastatin  20 mg daily, maintaining her LDL at 51 as of January 2025. She also takes aspirin  81 mg daily.  She has chronic kidney disease, stage 3B, with a creatinine level of 1.08 at the last check. Her blood pressure is well controlled with losartan  100 mg daily, nadolol  40 mg daily, spironolactone  25 mg daily, and amlodipine  2.5 mg daily.  She has rheumatoid arthritis and underwent right hip replacement in March 2022. She is on leflunomide  and reports fatigue, which she attributes to her rheumatoid arthritis.  She experiences occasional palpitations and takes nadolol  40 mg every morning for PVCs, along with Imdur  (isosorbide ) to help relax her arteries.  She reports occasional indigestion, which she believes is related to GERD rather than cardiac issues, and is monitoring her diet closely.  She is reducing her gabapentin  use due to bothersome night sweats and hot flashes.  She enjoys exercising at [employer] and is currently in physical therapy, which she finds helpful. She is losing some weight but not as much as she would like.  Her family history includes myocardial infarction in her mother in her eighties and her father at age  46.      ROS: No CP. +GERD. Fatigue  Studies Reviewed: Kari [name]   EKG Interpretation Date/Time:  Monday October 20 2023 08:32:44 EDT Ventricular Rate:  61 PR Interval:  230 QRS Duration:  112 QT Interval:  412 QTC Calculation: 414 R Axis:   -69  Text Interpretation: Sinus rhythm with 1st degree A-V block Incomplete right bundle branch block Left anterior fascicular block Minimal voltage criteria for LVH, may be normal variant ( Cornell product ) When compared with ECG of 12-Sep-2011 10:02, PR interval has increased Confirmed by Dorothye Gathers ([id number]) on 10/20/2023 8:39:06 AM    Results LABS Creatinine: 1.08 LDL: 51 (07/2023) A1c: 4.8  RADIOLOGY Coronary CT: Coronary calcium  score of 867 (02/28/2022)  DIAGNOSTIC Cardiac catheterization: Nonobstructive coronary artery disease (03/20/2022) EKG: Mild increase in PR interval, first-degree AV block (10/20/2023) Echocardiogram: Normal left ventricular function (02/22/2022) Risk Assessment/Calculations:            Physical Exam:   VS:  BP 132/80   Pulse 61   Ht 5\' 3"  (1.6 m)   Wt 196 lb (88.9 kg)   BMI 34.72 kg/m    Wt Readings from Last 3 Encounters:  10/20/23 196 lb (88.9 kg)  09/03/23 197 lb (89.4 kg)  05/15/23 201 lb 3.2 oz (91.3 kg)    GEN: Well nourished, well developed in no acute distress NECK: No JVD; No carotid bruits CARDIAC: RRR, no murmurs, no rubs, no gallops RESPIRATORY:  Clear to auscultation without rales, wheezing or rhonchi  ABDOMEN: Soft, non-tender, non-distended EXTREMITIES:  No  edema; No deformity   ASSESSMENT AND PLAN: .    Assessment and Plan Assessment & Plan Nonobstructive Coronary Artery Disease (CAD) Nonobstructive CAD confirmed by cardiac catheterization on 03/20/2022. EKG shows mild increase in PR interval and mild first degree AV block. Coronary calcium  score of 867 on 02/28/2022. Echocardiogram on 02/22/2022 showed normal pump function. LDL is well controlled at 51 as of January 2025. She  remains asymptomatic with no need for stenting. Current management focuses on medication to stabilize plaque and prevent progression, reducing the risk of myocardial infarction. - Continue aspirin  81 mg daily - Continue ezetimibe  10 mg daily - Continue rosuvastatin  20 mg daily - Continue nadolol  40 mg daily - Continue isosorbide  mononitrate 30 mg daily - Follow up in one year with an APP, and in two years with the cardiologist  Hypertension Blood pressure is well controlled with current medication regimen. - Continue losartan  50 mg daily - Continue amlodipine  2.5 mg daily - Continue spironolactone  25 mg daily  Chronic Kidney Disease Stage 3B CKD Stage 3B, managed by nephrology. Creatinine at last check was 1.08, indicating stable kidney function.  Rheumatoid Arthritis Rheumatoid arthritis managed with leflunomide . She is compliant with medication and monitored by nephrology. - Continue leflunomide  as prescribed  Gastroesophageal Reflux Disease (GERD) Intermittent symptoms of indigestion, likely related to GERD. She is managing symptoms with dietary modifications. - Continue dietary modifications to manage GERD symptoms  Goals of Care She expresses interest in travel and maintaining an active lifestyle. Engages in exercise and physical therapy. No discussion of life support or advanced directives. - Encourage continued physical activity and exercise - Support her travel goals         Dispo: 1 year with APP  Signed, Dorothye Gathers, MD

## 2023-10-21 ENCOUNTER — Other Ambulatory Visit: Payer: Self-pay | Admitting: *Deleted

## 2023-10-21 MED ORDER — GABAPENTIN 300 MG PO CAPS: 300.0000 mg | ORAL_CAPSULE | Freq: Every day | ORAL | 0 refills | Status: DC

## 2023-10-21 NOTE — Telephone Encounter (Signed)
 Refill request received via fax from [facility]  for Gabapentin   Last Fill: 07/14/2023  Next Visit: 02/03/2024  Last Visit: 09/03/2023  Dx: Myofascial pain syndrome   Current Dose per office note on 09/03/2023: deal dose will be only 300 mg at bedtime.   Okay to refill Gabapentin ?

## 2023-11-28 ENCOUNTER — Other Ambulatory Visit: Payer: Self-pay | Admitting: Physician Assistant

## 2023-11-28 DIAGNOSIS — M0579 Rheumatoid arthritis with rheumatoid factor of multiple sites without organ or systems involvement: Secondary | ICD-10-CM

## 2023-11-28 DIAGNOSIS — Z79899 Other long term (current) drug therapy: Secondary | ICD-10-CM

## 2023-11-28 NOTE — Telephone Encounter (Signed)
 Last Fill: 07/01/2023  Labs: 10/03/2023 RBC 3.67 MCH 33.2 Creat 1.08 eGFR 54 ALT 30  TB Gold: 12/11/2023 TB Gold Negative   Next Visit: 02/03/2024  Last Visit: 09/03/2023  [id number] arthritis with rheumatoid factor of multiple sites without organ or systems involvement   Current Dose per office note 09/03/2023: Simponi  50 mg sq injections once monthly   Okay to refill Simponi ?

## 2023-12-01 ENCOUNTER — Telehealth: Payer: Self-pay

## 2023-12-01 NOTE — Telephone Encounter (Signed)
 Patient contacted the office stating that she believes she is having a reaction to the Simponi . Patient said she took her injection 3 days ago an has been feeling nauseous since. Patient would like to know what she should do or if there is something else she can try.  Please advise.

## 2023-12-01 NOTE — Telephone Encounter (Signed)
 Patient has been on Simponi  since 01/27/2023 so I do not think the Simponi  is likely to be causing delayed nausea. This is not a common side effect of injectable TNF inhibitors  Geraldene Kleine, PharmD, MPH, BCPS, CPP Clinical Pharmacist (Rheumatology and Pulmonology)

## 2023-12-01 NOTE — Telephone Encounter (Signed)
 Contacted patient to advise that Patient has been on Simponi  since 01/27/2023 and Devki does not think the Simponi  is likely to be causing delayed nausea. This is not a common side effect of injectable TNF inhibitor. Recommend evaluation by PCP if nausea persists and If she does not want to continue simponi  she will need to schedule a visit to discuss other options. Patient stated that she started experiencing the nausea after her April injection and then again after her May injection. Patient will do her June injection at the end of the month and if the nausea comes back after she does, patient stated she would call the office to discuss other options or going back on Imuran like the past.

## 2023-12-01 NOTE — Telephone Encounter (Signed)
 I am in agreement with Devki.   Recommend evaluation by PCP if nausea persists.    If she does not want to continue simponi  she will need to schedule a visit to discuss other options.

## 2023-12-05 ENCOUNTER — Other Ambulatory Visit: Payer: Self-pay | Admitting: Physician Assistant

## 2023-12-05 DIAGNOSIS — M0579 Rheumatoid arthritis with rheumatoid factor of multiple sites without organ or systems involvement: Secondary | ICD-10-CM

## 2023-12-05 NOTE — Telephone Encounter (Signed)
 Last Fill: 09/09/2023  Labs: 10/03/2022 Creatinine is mildly elevated and stable.  Liver functions are mildly elevated probably due to the combination of leflunomide  and statin use.   CBC is stable.   Next Visit: 02/03/2024  Last Visit: 09/03/2023  DX: Rheumatoid arthritis with rheumatoid factor of multiple sites without organ or systems involvement   Current Dose per office note 09/03/2023: Arava  10 mg 1 tablet by mouth daily.    Okay to refill Arava  ?

## 2023-12-30 ENCOUNTER — Other Ambulatory Visit: Payer: Self-pay | Admitting: Cardiology

## 2024-01-08 ENCOUNTER — Other Ambulatory Visit: Payer: Self-pay | Admitting: *Deleted

## 2024-01-08 DIAGNOSIS — Z111 Encounter for screening for respiratory tuberculosis: Secondary | ICD-10-CM

## 2024-01-08 DIAGNOSIS — Z79899 Other long term (current) drug therapy: Secondary | ICD-10-CM

## 2024-01-08 DIAGNOSIS — Z9225 Personal history of immunosupression therapy: Secondary | ICD-10-CM

## 2024-01-09 ENCOUNTER — Ambulatory Visit: Payer: Self-pay | Admitting: Rheumatology

## 2024-01-09 NOTE — Progress Notes (Signed)
 CMP is normal, hemoglobin is low and stable.  Patient should continue to take multivitamin with iron.  TB Gold is pending.

## 2024-01-11 LAB — CBC WITH DIFFERENTIAL/PLATELET
Absolute Lymphocytes: 1687 {cells}/uL (ref 850–3900)
Absolute Monocytes: 638 {cells}/uL (ref 200–950)
Basophils Absolute: 91 {cells}/uL (ref 0–200)
Basophils Relative: 1.6 %
Eosinophils Absolute: 291 {cells}/uL (ref 15–500)
Eosinophils Relative: 5.1 %
HCT: 34.5 % — ABNORMAL LOW (ref 35.0–45.0)
Hemoglobin: 11.3 g/dL — ABNORMAL LOW (ref 11.7–15.5)
MCH: 33.3 pg — ABNORMAL HIGH (ref 27.0–33.0)
MCHC: 32.8 g/dL (ref 32.0–36.0)
MCV: 101.8 fL — ABNORMAL HIGH (ref 80.0–100.0)
MPV: 9.9 fL (ref 7.5–12.5)
Monocytes Relative: 11.2 %
Neutro Abs: 2993 {cells}/uL (ref 1500–7800)
Neutrophils Relative %: 52.5 %
Platelets: 200 Thousand/uL (ref 140–400)
RBC: 3.39 Million/uL — ABNORMAL LOW (ref 3.80–5.10)
RDW: 12.1 % (ref 11.0–15.0)
Total Lymphocyte: 29.6 %
WBC: 5.7 Thousand/uL (ref 3.8–10.8)

## 2024-01-11 LAB — QUANTIFERON-TB GOLD PLUS
Mitogen-NIL: 8.83 [IU]/mL
NIL: 0.02 [IU]/mL
QuantiFERON-TB Gold Plus: NEGATIVE
TB1-NIL: 0.01 [IU]/mL
TB2-NIL: 0.01 [IU]/mL

## 2024-01-11 LAB — COMPREHENSIVE METABOLIC PANEL WITH GFR
AG Ratio: 2.4 (calc) (ref 1.0–2.5)
ALT: 27 U/L (ref 6–29)
AST: 26 U/L (ref 10–35)
Albumin: 4.3 g/dL (ref 3.6–5.1)
Alkaline phosphatase (APISO): 50 U/L (ref 37–153)
BUN: 14 mg/dL (ref 7–25)
CO2: 23 mmol/L (ref 20–32)
Calcium: 8.9 mg/dL (ref 8.6–10.4)
Chloride: 106 mmol/L (ref 98–110)
Creat: 0.92 mg/dL (ref 0.60–1.00)
Globulin: 1.8 g/dL — ABNORMAL LOW (ref 1.9–3.7)
Glucose, Bld: 88 mg/dL (ref 65–99)
Potassium: 3.7 mmol/L (ref 3.5–5.3)
Sodium: 141 mmol/L (ref 135–146)
Total Bilirubin: 0.9 mg/dL (ref 0.2–1.2)
Total Protein: 6.1 g/dL (ref 6.1–8.1)
eGFR: 65 mL/min/1.73m2 (ref >=60)

## 2024-01-12 NOTE — Progress Notes (Signed)
 Hemoglobin is low at 11.3, CMP is normal, TB Gold is negative.  Patient should take multivitamin with iron.  Please forward results to her PCP.

## 2024-01-20 NOTE — Progress Notes (Unsigned)
 Office Visit Note  Patient: Kari [name]             Date of Birth: Aug 15, 1950           MRN: [id number]             PCP: Sheldon Netter, PA Referring: Sheldon Netter, [place] Visit Date: 02/03/2024 Occupation: @GUAROCC @  Subjective:  Left ankle joint pain   History of Present Illness: Kari [name] is a 73 y.o. female with history of seropositive rheumatoid arthritis and osteoarthritis.  She remains on Simponi  50 mg sq injections once monthly--started on 01/27/23 and arava  10 mg 1 tablet by mouth daily.  Patient presents today with increased discomfort in the left ankle.  She denies any injury or fall prior to the onset of symptoms.  Patient states that the symptoms started about 1 week ago but have been intermittent.  Patient states last week she had a day where she was having difficulty bearing full weight but overall her symptoms have started to subside.  She denies any joint swelling.  Patient states that overall her symptoms have been well-controlled on Simponi .  She denies any other signs or symptoms of a flare. Her most recent dose of Simponi  was administered on 01/28/2024.  Patient states that she has noted some increased constipation since initiating Simponi .  She has started on a stool softener.  Patient states she is also been experiencing nausea on a daily basis but does not seem to be exacerbated by the Simponi  injection.   Activities of Daily Living:  Patient reports morning stiffness for 0 minute.   Patient Denies nocturnal pain.  Difficulty dressing/grooming: Denies Difficulty climbing stairs: Reports Difficulty getting out of chair: Denies Difficulty using hands for taps, buttons, cutlery, and/or writing: Reports  Review of Systems  Constitutional:  Negative for fatigue.  HENT:  Positive for mouth sores. Negative for mouth dryness.   Eyes:  Positive for dryness.  Respiratory:  Negative for shortness of breath.   Cardiovascular:  Negative for chest pain and palpitations.   Gastrointestinal:  Negative for blood in stool, constipation and diarrhea.  Endocrine: Negative for increased urination.  Genitourinary:  Positive for involuntary urination.  Musculoskeletal:  Positive for joint pain, gait problem, joint pain and muscle tenderness. Negative for joint swelling, myalgias, muscle weakness, morning stiffness and myalgias.  Skin:  Positive for sensitivity to sunlight. Negative for color change, rash and hair loss.  Allergic/Immunologic: Negative for susceptible to infections.  Neurological:  Negative for dizziness and headaches.  Hematological:  Negative for swollen glands.  Psychiatric/Behavioral:  Positive for depressed mood and sleep disturbance. The patient is not nervous/anxious.     PMFS History:  Patient Active Problem List   Diagnosis Date Noted   Moderate, nonobstructive CAD (coronary artery disease) 04/11/2022   Dizziness with bending over 04/11/2022   Stage 3a chronic kidney disease (CKD) (HCC) 04/11/2022   Anemia 04/11/2022   Class 2 obesity 04/11/2022   Precordial chest pain    Rheumatoid arthritis with rheumatoid factor of multiple sites without organ or systems involvement (HCC) 10/17/2020   High risk medication use 10/17/2020   History of total replacement of both hip joints 10/17/2020   DDD (degenerative disc disease), lumbar 10/17/2020   Myofascial pain syndrome 10/17/2020   Age-related osteoporosis without current pathological fracture 10/17/2020   Frequent falls 10/17/2020   Hyperlipidemia 07/14/2015   Palpitation 05/11/2014   PVC (premature ventricular contraction) 05/11/2014   Hypokalemia 05/11/2014   Essential hypertension 05/11/2014  Mitral regurgitation 05/11/2014   Arthritis, hip 02/03/2013    Past Medical History:  Diagnosis Date   Anemia    Arthritis    Class 2 obesity    Complication of anesthesia    when block attempted ax -turned red in preop-surg delayed   Dizziness    Fibromyalgia    GERD (gastroesophageal  reflux disease)    HLD (hyperlipidemia)    Hypertension    Hypothyroidism    Mild to moderate, nonobstructive CAD (coronary artery disease)    MVP (mitral valve prolapse)    Osteoporosis    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   Rheumatoid aortitis    ? arthritis   Stage 3a chronic kidney disease (CKD) (HCC)    Unstable angina (HCC)    cath revealed moderate, nonobstructive CAD.  50% stenosis of mid RCA with 25% stenosis of distal LAD, EF 55 to 65%.    Family History  Problem Relation Age of Onset   Heart attack Father    Heart disease Mother    Stroke Mother    Colon cancer Other    Colon polyps Other    Hypothyroidism Son    Past Surgical History:  Procedure Laterality Date   ABDOMINAL HYSTERECTOMY     CESAREAN SECTION     CHOLECYSTECTOMY     COLONOSCOPY     FOOT SURGERY Right 02/27/2023   1 pin in 5th metatarsal   FOOT SURGERY Left    JOINT REPLACEMENT     KNEE ARTHROSCOPY Right 08/2009   LEFT HEART CATH AND CORONARY ANGIOGRAPHY N/A 03/26/2022   Procedure: LEFT HEART CATH AND CORONARY ANGIOGRAPHY;  Surgeon: Dann Candyce RAMAN, MD;  Location: [facility];  Service: Cardiovascular;  Laterality: N/A;   TOTAL HIP ARTHROPLASTY Left 02/03/2013   Procedure: TOTAL HIP ARTHROPLASTY;  Surgeon: Dempsey JINNY Sensor, MD;  Location: [facility] OR;  Service: Orthopedics;  Laterality: Left;   TOTAL HIP ARTHROPLASTY Right 09/22/2020   Dr. Sensor COWBOY NERVE TRANSPOSITION  09/17/2011   Procedure: ULNAR NERVE DECOMPRESSION/TRANSPOSITION;  Surgeon: Lamar LULLA Leonor Mickey., MD;  Location:  SURGERY CENTER;  Service: Orthopedics;  Laterality: Left;  decompression   WRIST ARTHROSCOPY Right    x2-fusion     WRIST ARTHROSCOPY Left    Social History   Social History Narrative   Not on file   Immunization History  Administered Date(s) Administered   PFIZER(Purple Top)SARS-COV-2 Vaccination  08/05/2019, 08/26/2019, 02/14/2020, 01/16/2021     Objective: Vital Signs: BP 123/75 (BP Location: Left Arm, Patient Position: Sitting, Cuff Size: Normal)   Pulse (!) 59   Resp 14   Ht 5' 2.5 (1.588 m)   Wt 197 lb (89.4 kg)   BMI 35.46 kg/m    Physical Exam Vitals and nursing note reviewed.  Constitutional:      Appearance: She is well-developed.  HENT:     Head: Normocephalic and atraumatic.  Eyes:     Conjunctiva/sclera: Conjunctivae normal.  Cardiovascular:     Rate and Rhythm: Normal rate and regular rhythm.     Heart sounds: Normal heart sounds.  Pulmonary:     Effort: Pulmonary effort is normal.     Breath sounds: Normal breath sounds.  Abdominal:     General: Bowel sounds are normal.     Palpations: Abdomen is soft.  Musculoskeletal:     Cervical back: Normal range of motion.  Lymphadenopathy:  Cervical: No cervical adenopathy.  Skin:    General: Skin is warm and dry.     Capillary Refill: Capillary refill takes less than 2 seconds.  Neurological:     Mental Status: She is alert and oriented to person, place, and time.  Psychiatric:        Behavior: Behavior normal.      Musculoskeletal Exam: C-spine has good range of motion.  Thoracic opposes noted.  Shoulder joints and elbow joints have good range of motion with no discomfort.  Right wrist is fused.  Slightly limited range of motion of the left wrist.  No tenderness or synovitis over MCP joints.  Both replacements have good range of motion.  Knee joints have good range of motion with no warmth or effusion.  Tenderness on the anterior aspect of the left ankle and over the intertarsal joints. Right ankle has good ROM with no tenderness or joint swelling.   CDAI Exam: CDAI Score: -- Patient Global: --; Provider Global: -- Swollen: --; Tender: -- Joint Exam 02/03/2024   No joint exam has been documented for this visit   There is currently no information documented on the homunculus. Go to the Rheumatology  activity and complete the homunculus joint exam.  Investigation: No additional findings.  Imaging: No results found.  Recent Labs: Lab Results  Component Value Date   WBC 5.7 01/08/2024   HGB 11.3 (L) 01/08/2024   PLT 200 01/08/2024   NA 141 01/08/2024   K 3.7 01/08/2024   CL 106 01/08/2024   CO2 23 01/08/2024   GLUCOSE 88 01/08/2024   BUN 14 01/08/2024   CREATININE 0.92 01/08/2024   BILITOT 0.9 01/08/2024   ALKPHOS 72 01/20/2017   AST 26 01/08/2024   ALT 27 01/08/2024   PROT 6.1 01/08/2024   ALBUMIN 4.2 01/20/2017   CALCIUM  8.9 01/08/2024   GFRAA 72 12/04/2020   QFTBGOLDPLUS NEGATIVE 01/08/2024    Speciality Comments: Enbrel  - stopped Jan 2024 d/t losing PAP coverage  Simponi  started 01/27/23  Procedures:  No procedures performed Allergies: Humira [adalimumab], Clinoril [sulindac], Erythromycin, Hydrocodone , Ibuprofen, Lisinopril, Oxycodone , Penicillins, Plaquenil [hydroxychloroquine], Sulfa antibiotics, Tramadol hcl, Trimethoprim, and Vancomycin cross reactors       Assessment / Plan:     Visit Diagnoses: Rheumatoid arthritis with rheumatoid factor of multiple sites without organ or systems involvement (HCC): She has no synovitis on examination today.  She has clinically been doing well on Simponi  50 mg subcutaneous injections once monthly and remains on Areva 10 mg daily.  Her most recent dose of Simponi  was administered on 01/28/2024.  No recent gaps in therapy.  She has not had any recent or recurrent infections.  She has noticed clinical benefit well on Simponi  and has no signs of active flare at this time.  She was advised to notify us  if she develops any new or worsening symptoms.  She will follow-up in the office in 5 months or sooner if needed.  High risk medication use - Simponi  50 mg sq injections once monthly--started on 01/27/23. Arava  10 mg 1 tablet by mouth daily. CBC and CMP updated on 01/08/24.   TB gold negative on 01/08/24.  Lipid panel updated on  07/03/23-WNL.  Discussed the importance of holding simponi  and arava  if she develops signs or symptoms of an infection and to resume once the infection has completely cleraed.   Medial epicondylitis of both elbows: Not currently symptomatic.  History of total replacement of both hip joints: Doing well.  No  groin pain.  Pain in left ankle and joints of left foot -She presents today with intermittent discomfort in the left ankle x 1 week.  No injury or fall prior to the onset of symptoms.  Last week her pain was so severe she was having difficulty ambulating but now she describes the pain as a lingering soreness.  She is tender on the anterior aspect of the left ankle especially over the intertarsal joints.  No warmth or synovitis noted.X-rays of the left foot were obtained today for further evaluation.   Discussed the importance of rest, ice, compression, and elevation.  She can also try using topical agents as needed.  Plan: XR Foot Complete Left  Degeneration of intervertebral disc of lumbar region without discogenic back pain or lower extremity pain: Limited mobility.  Myofascial pain syndrome -She has intermittent myalgias and muscle tenderness due to myofascial pain.  She remains on gabapentin  300 mg at bedtime.  Age-related osteoporosis without current pathological fracture - DEXA: 02/21/2022 T-score -1.8, BMD 0.586 left one third radius which showed -6% change from August 2021.  AP total spine T-score +0.7, 5% improvement.   She was initiated on evenity  subcutaneous injection in August 2024--discontinued due to the cost.   She had IV Reclast  in November 2024.  She tolerated IV Reclast  without any side effects.  Due to update DEXA--order placed today.: DG BONE DENSITY (DXA)  Encounter for medication monitoring - Previously treated with fosamax and forteo (completed 2 years of treatment). Reclast  IV on 06/18/2021.Patient initiated evenity  on 02/24/23-discontinued due to cost.  Received IV Reclast   in November 2024.  Vitamin D  deficiency: She is taking vitamin D  2000 units daily.  Other medical conditions are listed as follows:   History of hypertension: Blood pressure was 123/75 today in the office.  History of hypothyroidism  History of neuropathy - She is taking gabapentin  300 mg at bedtime.   History of hyperlipidemia: Lipid panel within normal limits on 07/03/2023.   Orders: Orders Placed This Encounter  Procedures   DG BONE DENSITY (DXA)   XR Foot Complete Left   No orders of the defined types were placed in this encounter.   Follow-Up Instructions: Return in about 5 months (around 07/05/2024) for Rheumatoid arthritis, Osteoporosis.   Waddell CHRISTELLA Craze, PA-C  Note - This record has been created using Dragon software.  Chart creation errors have been sought, but may not always  have been located. Such creation errors do not reflect on  the standard of medical care.

## 2024-01-22 ENCOUNTER — Other Ambulatory Visit: Payer: Self-pay | Admitting: Cardiology

## 2024-01-23 ENCOUNTER — Other Ambulatory Visit: Payer: Self-pay | Admitting: Rheumatology

## 2024-01-23 NOTE — Telephone Encounter (Signed)
 Last Fill: 10/21/2023  Next Visit: 02/03/2024  Last Visit: 09/03/2023  Dx: Rheumatoid arthritis with rheumatoid factor of multiple sites without organ or systems involvement   Current Dose per office note on 09/03/2023: gabapentin  300 mg, 4 tablets p.o. nightly   Okay to refill Gabapentin ?    Contacted patient to confirm how medication is being taken. She is only taking one tablet at night not 4.

## 2024-02-03 ENCOUNTER — Ambulatory Visit (INDEPENDENT_AMBULATORY_CARE_PROVIDER_SITE_OTHER)

## 2024-02-03 ENCOUNTER — Encounter: Payer: Self-pay | Admitting: Physician Assistant

## 2024-02-03 ENCOUNTER — Ambulatory Visit: Attending: Physician Assistant | Admitting: Physician Assistant

## 2024-02-03 VITALS — BP 123/75 | HR 59 | Resp 14 | Ht 62.5 in | Wt 197.0 lb

## 2024-02-03 DIAGNOSIS — M0579 Rheumatoid arthritis with rheumatoid factor of multiple sites without organ or systems involvement: Secondary | ICD-10-CM | POA: Diagnosis not present

## 2024-02-03 DIAGNOSIS — M51369 Other intervertebral disc degeneration, lumbar region without mention of lumbar back pain or lower extremity pain: Secondary | ICD-10-CM

## 2024-02-03 DIAGNOSIS — Z8669 Personal history of other diseases of the nervous system and sense organs: Secondary | ICD-10-CM

## 2024-02-03 DIAGNOSIS — M79671 Pain in right foot: Secondary | ICD-10-CM

## 2024-02-03 DIAGNOSIS — Z96643 Presence of artificial hip joint, bilateral: Secondary | ICD-10-CM | POA: Diagnosis not present

## 2024-02-03 DIAGNOSIS — M7702 Medial epicondylitis, left elbow: Secondary | ICD-10-CM

## 2024-02-03 DIAGNOSIS — M25572 Pain in left ankle and joints of left foot: Secondary | ICD-10-CM | POA: Diagnosis not present

## 2024-02-03 DIAGNOSIS — M7701 Medial epicondylitis, right elbow: Secondary | ICD-10-CM | POA: Diagnosis not present

## 2024-02-03 DIAGNOSIS — E559 Vitamin D deficiency, unspecified: Secondary | ICD-10-CM

## 2024-02-03 DIAGNOSIS — M810 Age-related osteoporosis without current pathological fracture: Secondary | ICD-10-CM

## 2024-02-03 DIAGNOSIS — M7918 Myalgia, other site: Secondary | ICD-10-CM

## 2024-02-03 DIAGNOSIS — Z79899 Other long term (current) drug therapy: Secondary | ICD-10-CM | POA: Diagnosis not present

## 2024-02-03 DIAGNOSIS — Z8679 Personal history of other diseases of the circulatory system: Secondary | ICD-10-CM

## 2024-02-03 DIAGNOSIS — Z8639 Personal history of other endocrine, nutritional and metabolic disease: Secondary | ICD-10-CM

## 2024-02-04 ENCOUNTER — Ambulatory Visit: Payer: Self-pay | Admitting: Physician Assistant

## 2024-02-04 NOTE — Progress Notes (Signed)
 X-rays consistent with osteoarthritis.  No erosive changes or acute pathology noted.  Please notify the patient.

## 2024-03-01 ENCOUNTER — Other Ambulatory Visit: Payer: Self-pay | Admitting: Cardiology

## 2024-03-01 ENCOUNTER — Other Ambulatory Visit: Payer: Self-pay | Admitting: Physician Assistant

## 2024-03-01 DIAGNOSIS — M0579 Rheumatoid arthritis with rheumatoid factor of multiple sites without organ or systems involvement: Secondary | ICD-10-CM

## 2024-03-02 NOTE — Telephone Encounter (Signed)
 Last Fill: 12/05/2023  Labs: 01/08/2024 Hemoglobin is low at 11.3, CMP is normal, TB Gold is negative. Patient should take multivitamin with iron. Please forward results to her PCP.   Next Visit: 07/20/2024  Last Visit: 02/03/2024  DX: Rheumatoid arthritis with rheumatoid factor of multiple sites without organ or systems involvement (HCC)   Current Dose per office note 02/03/2024: Arava  10 mg 1 tablet by mouth daily   Okay to refill Arava  ?

## 2024-04-05 ENCOUNTER — Other Ambulatory Visit: Payer: Self-pay

## 2024-04-05 DIAGNOSIS — Z79899 Other long term (current) drug therapy: Secondary | ICD-10-CM

## 2024-04-05 LAB — COMPREHENSIVE METABOLIC PANEL WITH GFR
AG Ratio: 2.2 (calc) (ref 1.0–2.5)
ALT: 37 U/L — ABNORMAL HIGH (ref 6–29)
AST: 33 U/L (ref 10–35)
Albumin: 4.2 g/dL (ref 3.6–5.1)
Alkaline phosphatase (APISO): 60 U/L (ref 37–153)
BUN/Creatinine Ratio: 14 (calc) (ref 6–22)
BUN: 14 mg/dL (ref 7–25)
CO2: 25 mmol/L (ref 20–32)
Calcium: 9.2 mg/dL (ref 8.6–10.4)
Chloride: 105 mmol/L (ref 98–110)
Creat: 1.01 mg/dL — ABNORMAL HIGH (ref 0.60–1.00)
Globulin: 1.9 g/dL (ref 1.9–3.7)
Glucose, Bld: 117 mg/dL — ABNORMAL HIGH (ref 65–99)
Potassium: 3.7 mmol/L (ref 3.5–5.3)
Sodium: 138 mmol/L (ref 135–146)
Total Bilirubin: 0.9 mg/dL (ref 0.2–1.2)
Total Protein: 6.1 g/dL (ref 6.1–8.1)
eGFR: 58 mL/min/1.73m2 — ABNORMAL LOW (ref >=60)

## 2024-04-05 LAB — CBC WITH DIFFERENTIAL/PLATELET
Absolute Lymphocytes: 1638 {cells}/uL (ref 850–3900)
Absolute Monocytes: 546 {cells}/uL (ref 200–950)
Basophils Absolute: 80 {cells}/uL (ref 0–200)
Basophils Relative: 1.5 %
Eosinophils Absolute: 313 {cells}/uL (ref 15–500)
Eosinophils Relative: 5.9 %
HCT: 36.1 % (ref 35.0–45.0)
Hemoglobin: 11.9 g/dL (ref 11.7–15.5)
MCH: 33.1 pg — ABNORMAL HIGH (ref 27.0–33.0)
MCHC: 33 g/dL (ref 32.0–36.0)
MCV: 100.6 fL — ABNORMAL HIGH (ref 80.0–100.0)
MPV: 9.9 fL (ref 7.5–12.5)
Monocytes Relative: 10.3 %
Neutro Abs: 2724 {cells}/uL (ref 1500–7800)
Neutrophils Relative %: 51.4 %
Platelets: 199 Thousand/uL (ref 140–400)
RBC: 3.59 Million/uL — ABNORMAL LOW (ref 3.80–5.10)
RDW: 11.7 % (ref 11.0–15.0)
Total Lymphocyte: 30.9 %
WBC: 5.3 Thousand/uL (ref 3.8–10.8)

## 2024-04-06 ENCOUNTER — Ambulatory Visit: Payer: Self-pay | Admitting: Rheumatology

## 2024-04-06 NOTE — Progress Notes (Signed)
 MCV is elevated and stable.  Multivitamin.  Glucose is elevated probably not a fasting sample.  Creatinine is mildly elevated probably due to diuretic use.  Liver functions mildly elevated.  Patient should avoid all NSAIDs.  Will recheck labs in 3 months.  Please forward results to PCP.

## 2024-04-12 ENCOUNTER — Other Ambulatory Visit: Payer: Self-pay | Admitting: Pharmacist

## 2024-04-12 NOTE — Progress Notes (Signed)
 Referral placed for Reclast  IV [id number]) at [facility] [phone]) [place] to start benefits investigation. She is due on or after 05/14/24  Diagnosis: age-related osteoporosis  Provider: Dr. Maya Nash and Waddell Craze, PA-C  Dose: 5mg  IV every 12 months Premedications: acetaminophen  650mg  p.o. and diphenhydramine  25mg  p.o.  Last dose/infusion date: 05/15/2023 Last Clinic Visit: 02/03/2024 Next Clinic Visit: 07/20/2024 CMP on 04/05/24 ; GFR 58, calcium  wnl  Once benefits are processed, infusion center will contact patient to schedule.  Sherry Pennant, PharmD, MPH, BCPS, CPP Clinical Pharmacist [facility])

## 2024-04-13 ENCOUNTER — Telehealth: Payer: Self-pay | Admitting: Pharmacy Technician

## 2024-04-13 NOTE — Telephone Encounter (Signed)
 Auth Submission: NO AUTH NEEDED Site of care: Site of care: [facility] Payer: [employer] ADVT Medication & CPT/J Code(s) submitted: Reclast  (Zolendronic acid) J3489 Diagnosis Code: M810 Route of submission (phone, fax, portal):  Phone # Fax # Auth type: Buy/Bill PB Units/visits requested: X1 DOSE Reference number:  Approval from: 04/13/24 to 06/30/24

## 2024-04-16 ENCOUNTER — Telehealth: Payer: Self-pay

## 2024-04-16 DIAGNOSIS — M0579 Rheumatoid arthritis with rheumatoid factor of multiple sites without organ or systems involvement: Secondary | ICD-10-CM

## 2024-04-16 DIAGNOSIS — Z79899 Other long term (current) drug therapy: Secondary | ICD-10-CM

## 2024-04-16 NOTE — Telephone Encounter (Signed)
 Patient called the office stating that [employer] denied her for Simponi  injections stating she needed to speak with the pharmacy team about getting assistance to be re-approved for this medication. Please advise

## 2024-04-20 MED ORDER — SIMPONI 50 MG/0.5ML ~~LOC~~ SOAJ: 50.0000 mg | SUBCUTANEOUS | 0 refills | Status: DC

## 2024-04-20 NOTE — Addendum Note (Signed)
 Addended by: DAYNE SHERRY RAMAN on: 04/20/2024 03:51 PM   Modules accepted: Orders

## 2024-04-20 NOTE — Telephone Encounter (Signed)
 Called [employer] for additional details. They state that patient is undergoing reverification process. She has access to medication until the end of the year.  Per rep, the income cut off for PAP is 300% [employer]. I spoke with patient. She states that she is well below this for household of one. Her son does live with her, but they do not share income.  She requested PAP enrollment form be emailed to her via [employer]. Advised her that Alwin will need to send this to her so may be later this week  She confirmed email: [email or web address]  Prescriber forms placed in Waddell Craze, PA-C's folder for signature  Sherry Pennant, PharmD, MPH, BCPS, CPP Clinical Pharmacist [facility])

## 2024-04-22 NOTE — Telephone Encounter (Signed)
 Form sent to pt via DocuSign. Will await it's completion and return.

## 2024-04-22 NOTE — Telephone Encounter (Signed)
 Prescriber form signed by Waddell Craze, PA-C. Handed to [employer] a Prior Authorization request to [employer] ADVANTAGE/RX ADVANCE for SIMPONI  SQ via CoverMyMeds. Will update once we receive a response.  Key: [id number]    Sherry Pennant, PharmD, MPH, BCPS, CPP Clinical Pharmacist [facility])

## 2024-04-23 ENCOUNTER — Other Ambulatory Visit (HOSPITAL_COMMUNITY): Payer: Self-pay

## 2024-04-23 NOTE — Telephone Encounter (Signed)
 Received notification from [facility] ADVANTAGE/RX ADVANCE regarding a prior authorization for SIMPONI  SQ. Authorization has been APPROVED from 04/22/2024 to 06/30/2025. Approval letter sent to scan center.  Per test claim, copay for 28 days supply is $100  Authorization # [id number]  Sherry Pennant, PharmD, MPH, BCPS, CPP Clinical Pharmacist [facility])

## 2024-04-27 NOTE — Telephone Encounter (Signed)
 Submitted Patient Assistance RENEWAL Application to [employer] for SIMPONI  SQ with patient portion, provider portion, insurance card copy, prior authorization approval, and current medication list. Will update patient when we receive a response.  Phone: [phone] Fax: [phone] and [phone] (historically there have been MANY issues with faxing to the right number)  Sherry Pennant, PharmD, MPH, BCPS, CPP Clinical Pharmacist [facility])

## 2024-05-17 ENCOUNTER — Ambulatory Visit

## 2024-05-17 ENCOUNTER — Encounter: Payer: Self-pay | Admitting: Pharmacist

## 2024-05-17 VITALS — BP 143/83 | HR 55 | Temp 97.5°F | Resp 16 | Ht 62.5 in | Wt 194.2 lb

## 2024-05-17 DIAGNOSIS — M810 Age-related osteoporosis without current pathological fracture: Secondary | ICD-10-CM | POA: Diagnosis not present

## 2024-05-17 MED ORDER — SODIUM CHLORIDE 0.9 % IV SOLN: INTRAVENOUS | Status: DC

## 2024-05-17 MED ORDER — ZOLEDRONIC ACID 5 MG/100ML IV SOLN
5.0000 mg | Freq: Once | INTRAVENOUS | Status: AC
  Administered 2024-05-17: 5 mg via INTRAVENOUS
  Filled 2024-05-17: qty 100

## 2024-05-17 MED ORDER — ACETAMINOPHEN 325 MG PO TABS
650.0000 mg | ORAL_TABLET | Freq: Once | ORAL | Status: AC
  Administered 2024-05-17: 650 mg via ORAL
  Filled 2024-05-17: qty 2

## 2024-05-17 MED ORDER — DIPHENHYDRAMINE HCL 25 MG PO CAPS
25.0000 mg | ORAL_CAPSULE | Freq: Once | ORAL | Status: AC
  Administered 2024-05-17: 25 mg via ORAL
  Filled 2024-05-17: qty 1

## 2024-05-17 NOTE — Telephone Encounter (Signed)
 Error

## 2024-05-17 NOTE — Progress Notes (Signed)
 Diagnosis: Osteoporosis  Provider:  Lonna Coder MD  Procedure: IV Infusion  IV Type: Peripheral, IV Location: L Hand  Reclast  (Zolendronic Acid), Dose: 5 mg  Infusion Start Time: 1143  Infusion Stop Time: 1211  Post Infusion IV Care: Observation period completed and Peripheral IV Discontinued  Discharge: Condition: Good, Destination: Home . AVS Provided  Performed by:  Aldina Porta, RN

## 2024-05-17 NOTE — Addendum Note (Signed)
 Addended by: DAYNE SHERRY RAMAN on: 05/17/2024 01:34 PM   Modules accepted: Orders

## 2024-05-25 NOTE — Telephone Encounter (Signed)
 Attempted to contact [employer] patient assistance program for Simponi . Unable to speak to a representative for application status due to extended wait time.   For future correspondence, use [employer] patient assistance direct line: [phone].

## 2024-05-29 ENCOUNTER — Other Ambulatory Visit: Payer: Self-pay | Admitting: Physician Assistant

## 2024-05-29 DIAGNOSIS — M0579 Rheumatoid arthritis with rheumatoid factor of multiple sites without organ or systems involvement: Secondary | ICD-10-CM

## 2024-05-31 ENCOUNTER — Other Ambulatory Visit: Payer: Self-pay | Admitting: Rheumatology

## 2024-05-31 NOTE — Telephone Encounter (Signed)
 Last Fill: 01/23/2024  Next Visit: 07/20/2024  Last Visit: 02/03/2024  DX: Myofascial pain syndrome   Current Dose per office note on 02/03/2024: gabapentin  300 mg at bedtime.   Okay to refill gabapentin ?

## 2024-05-31 NOTE — Telephone Encounter (Signed)
 Last Fill: 03/02/2024  Labs: 04/05/2024 MCV is elevated and stable. Multivitamin. Glucose is elevated probably not a fasting sample. Creatinine is mildly elevated probably due to diuretic use. Liver functions mildly elevated. Patient should avoid all NSAIDs. Will recheck labs in 3 months.   Next Visit: 07/20/2024  Last Visit: 02/03/2024  DX: Rheumatoid arthritis with rheumatoid factor of multiple sites without organ or systems involvement   Current Dose per office note on 02/03/2024: Arava  10 mg 1 tablet by mouth daily.   Okay to refill Arava  ?

## 2024-07-05 NOTE — Telephone Encounter (Signed)
 Received a fax from [employer] ([employer]) regarding an approval for SIMPONI  SQ patient assistance from 07/01/2024 to 06/30/2025. Approval letter sent to scan center.  Phone: [phone]

## 2024-07-06 NOTE — Progress Notes (Signed)
 "  Office Visit Note  Patient: Kari [name]             Date of Birth: 1951/02/22           MRN: [id number]             PCP: Sheldon Netter, [place] Referring: Sheldon Netter, [place] Visit Date: 07/20/2024 Occupation: Data Unavailable  Subjective:  Increased lower back pain  History of Present Illness: Kari [name] is a 74 y.o. female with seropositive rheumatoid arthritis, osteoarthritis and osteoporosis.  She returns today after her last visit in August 2025.  She states she fell on Thanksgiving day and fracture T12.  She had a kyphoplasty a week ago.  She states she had no relief from kyphoplasty at [facility].  She states her pain level on 0-10 is about 8.  Her last Reclast  infusion was May 17, 2024.  She has not had repeat DEXA scan.  Her rheumatoid arthritis is well-controlled without any increased joint pain or joint swelling.  She has been on Simponi  50 mg subcu monthly without any interruption.  She is also on Arava  10 mg p.o. daily.    Activities of Daily Living:  Patient reports morning stiffness for a few minutes.   Patient Reports nocturnal pain.  Difficulty dressing/grooming: Denies Difficulty climbing stairs: Reports Difficulty getting out of chair: Reports Difficulty using hands for taps, buttons, cutlery, and/or writing: Denies  Review of Systems  Constitutional:  Positive for fatigue.  HENT:  Negative for mouth sores and mouth dryness.   Eyes:  Negative for dryness.  Respiratory:  Negative for shortness of breath.   Cardiovascular:  Negative for chest pain and palpitations.  Gastrointestinal:  Negative for blood in stool, constipation and diarrhea.  Endocrine: Negative for increased urination.  Genitourinary:  Negative for involuntary urination.  Musculoskeletal:  Positive for joint pain, gait problem, joint pain, myalgias, muscle weakness, morning stiffness, muscle tenderness and myalgias. Negative for joint swelling.  Skin:  Positive for rash. Negative for  color change, hair loss and sensitivity to sunlight.  Allergic/Immunologic: Negative for susceptible to infections.  Neurological:  Negative for dizziness and headaches.  Hematological:  Negative for swollen glands.  Psychiatric/Behavioral:  Positive for depressed mood. Negative for sleep disturbance. The patient is nervous/anxious.     PMFS History:  Patient Active Problem List   Diagnosis Date Noted   Moderate, nonobstructive CAD (coronary artery disease) 04/11/2022   Dizziness with bending over 04/11/2022   Stage 3a chronic kidney disease (CKD) (HCC) 04/11/2022   Anemia 04/11/2022   Class 2 obesity 04/11/2022   Precordial chest pain    Rheumatoid arthritis with rheumatoid factor of multiple sites without organ or systems involvement (HCC) 10/17/2020   High risk medication use 10/17/2020   History of total replacement of both hip joints 10/17/2020   DDD (degenerative disc disease), lumbar 10/17/2020   Myofascial pain syndrome 10/17/2020   Age-related osteoporosis without current pathological fracture 10/17/2020   Frequent falls 10/17/2020   Hyperlipidemia 07/14/2015   Palpitation 05/11/2014   PVC (premature ventricular contraction) 05/11/2014   Hypokalemia 05/11/2014   Essential hypertension 05/11/2014   Mitral regurgitation 05/11/2014   Arthritis, hip 02/03/2013    Past Medical History:  Diagnosis Date   Anemia    Arthritis    Class 2 obesity    Complication of anesthesia    when block attempted ax -turned red in preop-surg delayed   Dizziness    Fibromyalgia    GERD (gastroesophageal reflux disease)  HLD (hyperlipidemia)    Hypertension    Hypothyroidism    Mild to moderate, nonobstructive CAD (coronary artery disease)    MVP (mitral valve prolapse)    Osteoporosis    Peripheral artery vasospasm    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   Rheumatoid aortitis    ? arthritis   Stage 3a chronic kidney  disease (CKD) (HCC)    Unstable angina (HCC)    cath revealed moderate, nonobstructive CAD.  50% stenosis of mid RCA with 25% stenosis of distal LAD, EF 55 to 65%.    Family History  Problem Relation Age of Onset   Heart attack Father    Heart disease Mother    Stroke Mother    Colon cancer Other    Colon polyps Other    Hypothyroidism Son    Past Surgical History:  Procedure Laterality Date   ABDOMINAL HYSTERECTOMY     CESAREAN SECTION     CHOLECYSTECTOMY     COLONOSCOPY     FOOT SURGERY Right 02/27/2023   1 pin in 5th metatarsal   FOOT SURGERY Left    JOINT REPLACEMENT     KNEE ARTHROSCOPY Right 08/2009   KYPHOPLASTY  07/15/2024   LEFT HEART CATH AND CORONARY ANGIOGRAPHY N/A 03/26/2022   Procedure: LEFT HEART CATH AND CORONARY ANGIOGRAPHY;  Surgeon: Dann Candyce RAMAN, MD;  Location: [facility];  Service: Cardiovascular;  Laterality: N/A;   TOTAL HIP ARTHROPLASTY Left 02/03/2013   Procedure: TOTAL HIP ARTHROPLASTY;  Surgeon: Dempsey JINNY Sensor, MD;  Location: [facility] OR;  Service: Orthopedics;  Laterality: Left;   TOTAL HIP ARTHROPLASTY Right 09/22/2020   Dr. Sensor COWBOY NERVE TRANSPOSITION  09/17/2011   Procedure: ULNAR NERVE DECOMPRESSION/TRANSPOSITION;  Surgeon: Lamar LULLA Leonor Mickey., MD;  Location: [facility];  Service: Orthopedics;  Laterality: Left;  decompression   WRIST ARTHROSCOPY Right    x2-fusion     WRIST ARTHROSCOPY Left    Social History[1] Social History   Social History Narrative   Not on file     Immunization History  Administered Date(s) Administered   PFIZER(Purple Top)SARS-COV-2 Vaccination 08/05/2019, 08/26/2019, 02/14/2020, 01/16/2021   Pfizer(Comirnaty)Fall Seasonal Vaccine 12 years and older 05/25/2024     Objective: Vital Signs: BP 116/72   Pulse 62   Temp 97.6 F (36.4 C)   Resp 15   Ht 5' 3 (1.6 m)   Wt 188 lb 3.2 oz (85.4 kg)   BMI 33.34 kg/m    Physical Exam Vitals and nursing note reviewed.   Constitutional:      Appearance: She is well-developed.  HENT:     Head: Normocephalic and atraumatic.  Eyes:     Conjunctiva/sclera: Conjunctivae normal.  Cardiovascular:     Rate and Rhythm: Normal rate and regular rhythm.     Heart sounds: Normal heart sounds.  Pulmonary:     Effort: Pulmonary effort is normal.     Breath sounds: Normal breath sounds.  Abdominal:     General: Bowel sounds are normal.     Palpations: Abdomen is soft.  Musculoskeletal:     Cervical back: Normal range of motion.  Lymphadenopathy:     Cervical: No cervical adenopathy.  Skin:    General: Skin is warm and dry.     Capillary Refill: Capillary refill takes less than 2 seconds.  Neurological:     Mental Status: She is alert and oriented to person, place, and time.  Psychiatric:        Behavior: Behavior normal.      Musculoskeletal Exam: Patient had good range of motion of the cervical spine.  She had discomfort in her thoracic region.  Shoulders and elbow joints were in good range of motion.  She had limited range of motion of her right wrist joint due to previous fusion.  She had limited range of motion of her left wrist joint due to arthritis.  No synovitis was noted.  There was no synovitis over MCPs PIPs or DIPs.  She had good range of motion of her hip joints and knee joints.  Bilateral hip joints are replaced.  There was no tenderness over ankles or MTPs.  CDAI Exam: CDAI Score: -- Patient Global: 0 / 100; Provider Global: 0 / 100 Swollen: --; Tender: -- Joint Exam 07/20/2024   No joint exam has been documented for this visit   There is currently no information documented on the homunculus. Go to the Rheumatology activity and complete the homunculus joint exam.  Investigation: No additional findings.  Imaging: No results found.  Recent Labs: Lab Results  Component Value Date   WBC 7.0 07/07/2024   HGB 11.3 (L) 07/07/2024   PLT 266 07/07/2024   NA 135 07/07/2024   K 4.3  07/07/2024   CL 103 07/07/2024   CO2 24 07/07/2024   GLUCOSE 89 07/07/2024   BUN 21 07/07/2024   CREATININE 1.15 (H) 07/07/2024   BILITOT 0.8 07/07/2024   ALKPHOS 72 01/20/2017   AST 37 (H) 07/07/2024   ALT 40 (H) 07/07/2024   PROT 6.5 07/07/2024   ALBUMIN 4.2 01/20/2017   CALCIUM  9.1 07/07/2024   GFRAA 72 12/04/2020   QFTBGOLDPLUS NEGATIVE 01/08/2024    Speciality Comments: Enbrel  - stopped Jan 2024 d/t losing PAP coverage  Simponi  started 01/27/23  Reclast  05/15/23, 05/17/24  Procedures:  No procedures performed Allergies: Humira [adalimumab], Clinoril [sulindac], Erythromycin, Hydrocodone , Ibuprofen, Lisinopril, Oxycodone , Penicillins, Plaquenil [hydroxychloroquine], Sulfa antibiotics, Tramadol hcl, Trimethoprim, and Vancomycin cross reactors   Assessment / Plan:     Visit Diagnoses: Rheumatoid arthritis with rheumatoid factor of multiple sites without organ or systems involvement (HCC)-patient states that her rheumatoid arthritis is well-controlled without any flares.  She had no synovitis on the examination.  She continues to be on complete combination of subcutaneous Simponi  and leflunomide .  She denies any interruption in the treatment.  High risk medication use - Simponi  50 mg sq injections once monthly--started on 01/27/23. Arava  10 mg 1 tablet by mouth daily.July 08, 2023 CBC showed hemoglobin of 11.3 and CMP elevated creatinine at 1.15, AST 37 and ALT 40.  Patient states she has been taking a lot of Tylenol  due to lower back pain.  I advised her to have repeat CMP in 1 month and then labs every 3 months to monitor for drug toxicity.  TB Gold was negative on January 08, 2024.  Annual TB Gold was advised.  Information regarding the immunization was placed in the AVS.  She was advised to hold Simponi  and Areva if she develops an infection and resume after the infection resolves.  Annual skin examination to screen for skin cancer was advised.  Use of sunscreen and sun protection was  advised.  Elevated LFTs-most likely due to Tylenol  use.  She was advised to have repeat CMP in 1 month.  Medial epicondylitis of both elbows-resolved.  History of total replacement of both hip joints-well.  Degeneration of intervertebral disc of lumbar region without discogenic  back pain or lower extremity pain-has been having thoracic pain due to recent fall and fracture.  Myofascial pain syndrome -she continues to have some generalized pain and discomfort from myofascial pain syndrome.  She is on gabapentin  300 mg at bedtime which helps relieve discomfort.  Age-related osteoporosis without current pathological fracture - DEXA: 02/21/2022 T-score -1.8, BMD 0.586 left one third radius which showed -6% change from August 2021.  AP total spine T-score +0.7, 5% improvement.  She was advised to have repeat DEXA scan which she could not perform.  Advised her to get DEXA scan is scheduled again.  She has been having thoracic pain since the fall.  She states she was scheduled once the  Compression fracture of T12 vertebra, sequela-patient fell on Thanksgiving day and acquired T12 compression fracture.  Patient has been going to [employer].  Patient states she had kyphoplasty last week but had no relief so far.  She has a follow-up appointment next week.  She describes pain on the scale of 0-10 about 8.  She has been taking Tylenol  for pain relief.  Encounter for medication monitoring - Previous fosamax & forteo (completed 2 years of treatment). Reclast  IV 06/18/2021. initiated evenity  on 02/24/23-d/c due to cost. IV Reclast  in November 2024.  Will make further decision after  Vitamin D  deficiency-vitamin D  was normal in January 2025.  Other medical problems are listed as follows:  History of hypothyroidism  History of hypertension  History of hyperlipidemia  History of neuropathy - gabapentin  300 mg at bedtime.  Orders: No orders of the defined types were placed in this encounter.  No  orders of the defined types were placed in this encounter.   Follow-Up Instructions: Return in about 5 months (around 12/18/2024) for Rheumatoid arthritis, Osteoarthritis.   Maya Nash, MD  Note - This record has been created using Animal nutritionist.  Chart creation errors have been sought, but may not always  have been located. Such creation errors do not reflect on  the standard of medical care.     [1]  Social History Tobacco Use   Smoking status: Never    Passive exposure: Past   Smokeless tobacco: Never  Vaping Use   Vaping status: Never Used  Substance Use Topics   Alcohol use: Not Currently   Drug use: No   "

## 2024-07-07 ENCOUNTER — Other Ambulatory Visit: Payer: Self-pay | Admitting: *Deleted

## 2024-07-07 DIAGNOSIS — Z79899 Other long term (current) drug therapy: Secondary | ICD-10-CM

## 2024-07-08 ENCOUNTER — Ambulatory Visit: Payer: Self-pay | Admitting: Rheumatology

## 2024-07-08 LAB — COMPREHENSIVE METABOLIC PANEL WITH GFR
AG Ratio: 2 (calc) (ref 1.0–2.5)
ALT: 40 U/L — ABNORMAL HIGH (ref 6–29)
AST: 37 U/L — ABNORMAL HIGH (ref 10–35)
Albumin: 4.3 g/dL (ref 3.6–5.1)
Alkaline phosphatase (APISO): 124 U/L (ref 37–153)
BUN/Creatinine Ratio: 18 (calc) (ref 6–22)
BUN: 21 mg/dL (ref 7–25)
CO2: 24 mmol/L (ref 20–32)
Calcium: 9.1 mg/dL (ref 8.6–10.4)
Chloride: 103 mmol/L (ref 98–110)
Creat: 1.15 mg/dL — ABNORMAL HIGH (ref 0.60–1.00)
Globulin: 2.2 g/dL (ref 1.9–3.7)
Glucose, Bld: 89 mg/dL (ref 65–99)
Potassium: 4.3 mmol/L (ref 3.5–5.3)
Sodium: 135 mmol/L (ref 135–146)
Total Bilirubin: 0.8 mg/dL (ref 0.2–1.2)
Total Protein: 6.5 g/dL (ref 6.1–8.1)
eGFR: 50 mL/min/1.73m2 — ABNORMAL LOW

## 2024-07-08 LAB — CBC WITH DIFFERENTIAL/PLATELET
Absolute Lymphocytes: 2380 {cells}/uL (ref 850–3900)
Absolute Monocytes: 756 {cells}/uL (ref 200–950)
Basophils Absolute: 98 {cells}/uL (ref 0–200)
Basophils Relative: 1.4 %
Eosinophils Absolute: 189 {cells}/uL (ref 15–500)
Eosinophils Relative: 2.7 %
HCT: 34.3 % — ABNORMAL LOW (ref 35.9–46.0)
Hemoglobin: 11.3 g/dL — ABNORMAL LOW (ref 11.7–15.5)
MCH: 32.8 pg (ref 27.0–33.0)
MCHC: 32.9 g/dL (ref 31.6–35.4)
MCV: 99.4 fL (ref 81.4–101.7)
MPV: 9.3 fL (ref 7.5–12.5)
Monocytes Relative: 10.8 %
Neutro Abs: 3577 {cells}/uL (ref 1500–7800)
Neutrophils Relative %: 51.1 %
Platelets: 266 Thousand/uL (ref 140–400)
RBC: 3.45 Million/uL — ABNORMAL LOW (ref 3.80–5.10)
RDW: 12 % (ref 11.0–15.0)
Total Lymphocyte: 34 %
WBC: 7 Thousand/uL (ref 3.8–10.8)

## 2024-07-08 NOTE — Progress Notes (Signed)
 Creatinine remains elevated, most likely due to diuretic use.  Liver functions are elevated, most likely due to statin use.  Hemoglobin is low and stable.  Please notify patient and forward results to her PCP.

## 2024-07-15 HISTORY — PX: KYPHOPLASTY: SHX5884

## 2024-07-20 ENCOUNTER — Encounter: Payer: Self-pay | Admitting: Rheumatology

## 2024-07-20 ENCOUNTER — Ambulatory Visit: Attending: Rheumatology | Admitting: Rheumatology

## 2024-07-20 VITALS — BP 116/72 | HR 62 | Temp 97.6°F | Resp 15 | Ht 63.0 in | Wt 188.2 lb

## 2024-07-20 DIAGNOSIS — Z96643 Presence of artificial hip joint, bilateral: Secondary | ICD-10-CM | POA: Diagnosis not present

## 2024-07-20 DIAGNOSIS — Z8669 Personal history of other diseases of the nervous system and sense organs: Secondary | ICD-10-CM

## 2024-07-20 DIAGNOSIS — Z8679 Personal history of other diseases of the circulatory system: Secondary | ICD-10-CM | POA: Diagnosis not present

## 2024-07-20 DIAGNOSIS — S22080S Wedge compression fracture of T11-T12 vertebra, sequela: Secondary | ICD-10-CM | POA: Diagnosis not present

## 2024-07-20 DIAGNOSIS — M7701 Medial epicondylitis, right elbow: Secondary | ICD-10-CM

## 2024-07-20 DIAGNOSIS — Z5181 Encounter for therapeutic drug level monitoring: Secondary | ICD-10-CM | POA: Diagnosis not present

## 2024-07-20 DIAGNOSIS — M7918 Myalgia, other site: Secondary | ICD-10-CM

## 2024-07-20 DIAGNOSIS — M0579 Rheumatoid arthritis with rheumatoid factor of multiple sites without organ or systems involvement: Secondary | ICD-10-CM

## 2024-07-20 DIAGNOSIS — M51369 Other intervertebral disc degeneration, lumbar region without mention of lumbar back pain or lower extremity pain: Secondary | ICD-10-CM | POA: Diagnosis not present

## 2024-07-20 DIAGNOSIS — Z8639 Personal history of other endocrine, nutritional and metabolic disease: Secondary | ICD-10-CM | POA: Diagnosis not present

## 2024-07-20 DIAGNOSIS — E559 Vitamin D deficiency, unspecified: Secondary | ICD-10-CM

## 2024-07-20 DIAGNOSIS — Z79899 Other long term (current) drug therapy: Secondary | ICD-10-CM

## 2024-07-20 DIAGNOSIS — M25572 Pain in left ankle and joints of left foot: Secondary | ICD-10-CM

## 2024-07-20 DIAGNOSIS — R7989 Other specified abnormal findings of blood chemistry: Secondary | ICD-10-CM

## 2024-07-20 DIAGNOSIS — M810 Age-related osteoporosis without current pathological fracture: Secondary | ICD-10-CM

## 2024-07-20 NOTE — Patient Instructions (Signed)
 Standing Labs We placed an order today for your standing lab work.   Please have your standing labs drawn in 1 month for CMP and then every 3 months  Please have your labs drawn 2 weeks prior to your appointment so that the provider can discuss your lab results at your appointment, if possible.  Please note that you may see your imaging and lab results in MyChart before we have reviewed them. We will contact you once all results are reviewed. Please allow our office up to 72 hours to thoroughly review all of the results before contacting the office for clarification of your results.  WALK-IN LAB HOURS  Monday through Thursday from 8:00 am - 4:30 pm and Friday from 8:00 am-12:00 pm.  Patients with office visits requiring labs will be seen before walk-in labs.  You may encounter longer than normal wait times. Please allow additional time. Wait times may be shorter on  Monday and Thursday afternoons.  We do not book appointments for walk-in labs. We appreciate your patience and understanding with our staff.   Labs are drawn by [facility]. Please bring your co-pay at the time of your lab draw.  You may receive a bill from [facility] for your lab work.  Please note if you are on Hydroxychloroquine and and an order has been placed for a Hydroxychloroquine level,  you will need to have it drawn 4 hours or more after your last dose.  If you wish to have your labs drawn at another location, please call the office 24 hours in advance so we can fax the orders.  The office is located at [address], [address], [place], [place] [address]   If you have any questions regarding directions or hours of operation,  please call [phone].   As a reminder, please drink plenty of water prior to coming for your lab work. Thanks!   Vaccines You are taking a medication(s) that can suppress your immune system.  The following immunizations are recommended: Flu annually Covid-19  RSV Td/Tdap (tetanus,  diphtheria, pertussis) every 10 years Pneumonia (Prevnar 15 then Pneumovax 23 at least 1 year apart.  Alternatively, can take Prevnar 20 without needing additional dose) Shingrix: 2 doses from 4 weeks to 6 months apart  Please check with your PCP to make sure you are up to date.   If you have signs or symptoms of an infection or start antibiotics: First, call your PCP for workup of your infection. Hold your medication through the infection, until you complete your antibiotics, and until symptoms resolve if you take the following: Injectable medication (Actemra, Benlysta, Cimzia, Cosentyx, Enbrel , Humira, Kevzara, Orencia, Remicade, Simponi , Stelara, Taltz, Tremfya) Methotrexate  Leflunomide  (Arava ) Mycophenolate (Cellcept) Xeljanz, Olumiant, or Rinvoq  Please get an annual skin examination to screen for skin cancer while you are on Simponi .  Please use sunscreen and sun protection.

## 2024-07-27 ENCOUNTER — Other Ambulatory Visit: Payer: Self-pay | Admitting: Rheumatology

## 2024-07-27 DIAGNOSIS — Z79899 Other long term (current) drug therapy: Secondary | ICD-10-CM

## 2024-07-27 DIAGNOSIS — M0579 Rheumatoid arthritis with rheumatoid factor of multiple sites without organ or systems involvement: Secondary | ICD-10-CM

## 2024-07-27 NOTE — Telephone Encounter (Signed)
 Last Fill: 04/20/2024   Labs: 07/07/2024 Creatinine remains elevated, most likely due to diuretic use. Liver functions are elevated, most likely due to statin use. Hemoglobin is low and stable.   TB Gold: 01/08/2024 negative    Next Visit: 12/20/2024  Last Visit: 07/20/2024  IK:Myzlfjunpi arthritis with rheumatoid factor of multiple sites without organ or systems involvement   Current Dose per office note on 07/20/2024: Simponi  50 mg sq injections once monthly-   Okay to refill Simponi ?

## 2024-12-20 ENCOUNTER — Ambulatory Visit: Admitting: Physician Assistant
